# Patient Record
Sex: Female | Born: 1955
Health system: Southern US, Community
[De-identification: ages and names within clinical notes are randomized; demographics above are authoritative.]

## PROBLEM LIST (undated history)

## (undated) DIAGNOSIS — R079 Chest pain, unspecified: Secondary | ICD-10-CM

## (undated) DIAGNOSIS — F209 Schizophrenia, unspecified: Secondary | ICD-10-CM

## (undated) DIAGNOSIS — I10 Essential (primary) hypertension: Secondary | ICD-10-CM

## (undated) DIAGNOSIS — G8929 Other chronic pain: Secondary | ICD-10-CM

## (undated) DIAGNOSIS — L0390 Cellulitis, unspecified: Secondary | ICD-10-CM

## (undated) DIAGNOSIS — R7989 Other specified abnormal findings of blood chemistry: Secondary | ICD-10-CM

## (undated) DIAGNOSIS — R55 Syncope and collapse: Secondary | ICD-10-CM

## (undated) DIAGNOSIS — E785 Hyperlipidemia, unspecified: Secondary | ICD-10-CM

## (undated) DIAGNOSIS — M7062 Trochanteric bursitis, left hip: Secondary | ICD-10-CM

## (undated) DIAGNOSIS — M797 Fibromyalgia: Secondary | ICD-10-CM

## (undated) DIAGNOSIS — F1410 Cocaine abuse, uncomplicated: Secondary | ICD-10-CM

## (undated) HISTORY — DX: Trochanteric bursitis, left hip: M70.62

## (undated) HISTORY — DX: Other specified abnormal findings of blood chemistry: R79.89

## (undated) HISTORY — DX: Cocaine abuse, uncomplicated: F14.10

## (undated) HISTORY — DX: Schizophrenia, unspecified: F20.9

## (undated) HISTORY — DX: Other chronic pain: G89.29

## (undated) HISTORY — DX: Cellulitis, unspecified: L03.90

## (undated) HISTORY — DX: Hyperlipidemia, unspecified: E78.5

## (undated) HISTORY — DX: Chest pain, unspecified: R07.9

## (undated) HISTORY — DX: Syncope and collapse: R55

## (undated) HISTORY — PX: OOPHORECTOMY: SHX86

---

## 1997-06-17 ENCOUNTER — Emergency Department (HOSPITAL_COMMUNITY): Admission: EM | Admit: 1997-06-17 | Discharge: 1997-06-17 | Payer: Self-pay | Admitting: Emergency Medicine

## 1997-06-30 ENCOUNTER — Inpatient Hospital Stay (HOSPITAL_COMMUNITY): Admission: AD | Admit: 1997-06-30 | Discharge: 1997-07-02 | Payer: Self-pay | Admitting: Emergency Medicine

## 1997-07-03 ENCOUNTER — Inpatient Hospital Stay (HOSPITAL_COMMUNITY): Admission: AD | Admit: 1997-07-03 | Discharge: 1997-07-03 | Payer: Self-pay | Admitting: Obstetrics and Gynecology

## 1997-07-20 ENCOUNTER — Other Ambulatory Visit: Admission: RE | Admit: 1997-07-20 | Discharge: 1997-07-20 | Payer: Self-pay | Admitting: Obstetrics and Gynecology

## 1998-09-13 ENCOUNTER — Emergency Department (HOSPITAL_COMMUNITY): Admission: EM | Admit: 1998-09-13 | Discharge: 1998-09-13 | Payer: Self-pay | Admitting: *Deleted

## 1999-01-26 ENCOUNTER — Emergency Department (HOSPITAL_COMMUNITY): Admission: EM | Admit: 1999-01-26 | Discharge: 1999-01-26 | Payer: Self-pay | Admitting: Emergency Medicine

## 2001-07-18 ENCOUNTER — Other Ambulatory Visit: Admission: RE | Admit: 2001-07-18 | Discharge: 2001-07-18 | Payer: Self-pay | Admitting: Obstetrics and Gynecology

## 2002-12-07 ENCOUNTER — Emergency Department (HOSPITAL_COMMUNITY): Admission: EM | Admit: 2002-12-07 | Discharge: 2002-12-07 | Payer: Self-pay | Admitting: Emergency Medicine

## 2002-12-15 ENCOUNTER — Encounter: Admission: RE | Admit: 2002-12-15 | Discharge: 2002-12-15 | Payer: Self-pay | Admitting: Cardiology

## 2003-08-31 ENCOUNTER — Emergency Department (HOSPITAL_COMMUNITY): Admission: EM | Admit: 2003-08-31 | Discharge: 2003-08-31 | Payer: Self-pay | Admitting: Emergency Medicine

## 2003-09-27 ENCOUNTER — Emergency Department (HOSPITAL_COMMUNITY): Admission: EM | Admit: 2003-09-27 | Discharge: 2003-09-27 | Payer: Self-pay | Admitting: Emergency Medicine

## 2003-09-29 ENCOUNTER — Emergency Department (HOSPITAL_COMMUNITY): Admission: EM | Admit: 2003-09-29 | Discharge: 2003-09-29 | Payer: Self-pay | Admitting: Emergency Medicine

## 2005-04-01 ENCOUNTER — Emergency Department (HOSPITAL_COMMUNITY): Admission: EM | Admit: 2005-04-01 | Discharge: 2005-04-01 | Payer: Self-pay | Admitting: Emergency Medicine

## 2006-03-21 ENCOUNTER — Emergency Department (HOSPITAL_COMMUNITY): Admission: EM | Admit: 2006-03-21 | Discharge: 2006-03-21 | Payer: Self-pay | Admitting: Emergency Medicine

## 2006-07-04 ENCOUNTER — Emergency Department (HOSPITAL_COMMUNITY): Admission: EM | Admit: 2006-07-04 | Discharge: 2006-07-04 | Payer: Self-pay | Admitting: *Deleted

## 2006-09-21 ENCOUNTER — Emergency Department (HOSPITAL_COMMUNITY): Admission: EM | Admit: 2006-09-21 | Discharge: 2006-09-21 | Payer: Self-pay | Admitting: Emergency Medicine

## 2007-04-12 ENCOUNTER — Emergency Department (HOSPITAL_COMMUNITY): Admission: EM | Admit: 2007-04-12 | Discharge: 2007-04-12 | Payer: Self-pay | Admitting: Nurse Practitioner

## 2007-04-14 ENCOUNTER — Ambulatory Visit: Payer: Self-pay | Admitting: Internal Medicine

## 2007-04-15 ENCOUNTER — Inpatient Hospital Stay (HOSPITAL_COMMUNITY): Admission: EM | Admit: 2007-04-15 | Discharge: 2007-04-16 | Payer: Self-pay | Admitting: Emergency Medicine

## 2007-10-07 ENCOUNTER — Encounter: Admission: RE | Admit: 2007-10-07 | Discharge: 2007-10-07 | Payer: Self-pay | Admitting: Family Medicine

## 2008-06-24 ENCOUNTER — Emergency Department (HOSPITAL_COMMUNITY): Admission: EM | Admit: 2008-06-24 | Discharge: 2008-06-24 | Payer: Self-pay | Admitting: Emergency Medicine

## 2010-05-27 NOTE — H&P (Signed)
NAME:  Kristine Atkinson, Kristine Atkinson            ACCOUNT NO.:  [id number]   MEDICAL RECORD NO.:  [id number]          PATIENT TYPE:  OBV   LOCATION:  5522                         FACILITY:  [facility]   PHYSICIAN:  Ruthe Mannan, M.D.       DATE OF BIRTH:  [birth year]   DATE OF ADMISSION:  04/14/2007  DATE OF DISCHARGE:                              HISTORY & PHYSICAL   CHIEF COMPLAINT:  Worsening cellulitis.   PRIMARY CARE PHYSICIAN:  None.   HISTORY OF PRESENT ILLNESS:  This is a 55 year old female with no known  medical history, who presents to the emergency department after left  flank pain and swelling, worsened over the last couple of days. Was seen  in the emergency room 3 days ago and treated for left leg cellulitis  with Keflex. Since that time, she has noticed a blister developing  between her first and second toe that is very tender, along with diffuse  itching and redness of the left foot. She does admit to some chills but  denies any other systemic symptoms.   PAST MEDICAL HISTORY:  None. She is a poor historian.   ALLERGIES:  PENICILLIN (itching and hives.)   SOCIAL HISTORY:  She lives with her husband. Smokes 1 pack per day.  Occasional alcohol. Denies drug use. Unemployed.   FAMILY HISTORY:  Unknown.   REVIEW OF SYSTEMS:  See HPI.   PHYSICAL EXAMINATION:  VITAL SIGNS:  Temperature 98.5, pulse 91,  respiratory rate 16, blood pressure 132/83. O2 sat is 97%.  GENERAL:  She is alert, in obvious discomfort.  RESPIRATORY:  Clear to auscultation bilaterally.  CARDIOVASCULAR:  Regular rate and rhythm. No murmur, rub, or gallop.  EXTREMITIES:  Left toes swollen, erythema and warmth extending to the  top of the left foot with 3 x 5 cm blister between the first and second  toe on the left foot. No other visible boils or erythema.   LABORATORY DATA:  White count 9.4. Hemoglobin and hematocrit 14.9 and  44. Platelets of 467,000. Creatinine 0.9, glucose 97.   ASSESSMENT/PLAN:  1.  CELLULITIS/ABSCESS:  The patient has history of itching. May also      have a possible cross-reaction allergy      to Keflex. Will start IV Vancomycin and culture foot abscess.  2. PAIN CONTROL:  Give p.o. Percocet for pain, as the foot is      exquisitely tender.  3. DISPOSITION:  Transition to p.o. Await cultures from foot. Need to      also get set up with a primary MD.      Ruthe Mannan, M.D.  Electronically Signed     TA/MEDQ  D:  04/14/2007  T:  04/14/2007  Job:  [id number]

## 2010-05-30 NOTE — Discharge Summary (Signed)
NAME:  Kristine Atkinson, Kristine Atkinson            ACCOUNT NO.:  [id number]   MEDICAL RECORD NO.:  [id number]          PATIENT TYPE:  INP   LOCATION:  5522                         FACILITY:  [facility]   PHYSICIAN:  Pearlean Brownie, M.D.DATE OF BIRTH:  08-20-1955   DATE OF ADMISSION:  04/14/2007  DATE OF DISCHARGE:  04/16/2007                               DISCHARGE SUMMARY   REASON FOR ADMISSION:  Worsening left foot cellulitis with failure of  outpatient treatment.   DISCHARGE DIAGNOSIS:  Cellulitis of the left foot.   DISCHARGE MEDICATIONS:  1. Bactrim DS 2 tablets p.o. b.i.d. for 14 days.  2. Hibiclens applied once weekly with solution per packet      instructions.   CONSULTATIONS:  None.   PROCEDURES AND STUDIES:  The patient had an x-ray of the left foot on  April 12, 2007, which revealed no acute abnormality.   LABORATORY DATA:  On admission, the patient's CBC revealed white blood  cell count 9.4, hemoglobin 14.9, hematocrit 44.0, platelet count poor at  67 with a normal differential.  Comprehensive metabolic panel revealed  sodium 136, potassium 4.2, chloride 104, bicarb 25, glucose 97, BUN 8,  creatinine 0.9, total bilirubin 0.5, alkaline phosphatase 72, AST 19,  ALT 16.  Total protein 7.0, albumin 30.9, and calcium 9.1.  Culture  abscess revealed moderate white blood cells and was no growth in 2 days.  At the time of discharge, the patient's laboratory data values were  essentially the same.   HOSPITAL COURSE:  [name] is a 55 year old female who is a poor  historian, who presented with a worsening left foot cellulitis and  allergic reaction to Keflex with which she was being treated.  She had  been started on Keflex several days prior to admission and noticed  worsening of the left foot wound in addition to severe itching.  The  left foot wound was I&D'ed by the admitting physician and she was  started on 3 times daily Hibiclens solution soaks to help clean the  wound, but the  wound opened up more to drain.  She was also started on  vancomycin for MRSA coverage.  She was transitioned after 24 hours of IV  antibiotics to Bactrim DS which she tolerated well.  She was continuing  to improve while on the Bactrim and remained afebrile.  She was felt  stable to discharge home for the remainder of her course on Bactrim.   INSTRUCTIONS AND FOLLOWUP:  She is to follow regular diet and is to  apply warm compresses 3-4 times daily until her foot wound has  completely resolved.  She has no restrictions with regard to her  activity.  She is to follow up with her primary care physician as  needed.      Ancil Boozer, MD  Electronically Signed      Pearlean Brownie, M.D.  Electronically Signed    SA/MEDQ  D:  04/27/2007  T:  04/28/2007  Job:  [id number]

## 2010-06-04 ENCOUNTER — Emergency Department (HOSPITAL_COMMUNITY)
Admission: EM | Admit: 2010-06-04 | Discharge: 2010-06-04 | Disposition: A | Discharge status: Home / Self Care | Payer: Self-pay | Attending: Emergency Medicine | Admitting: Emergency Medicine

## 2010-06-04 ENCOUNTER — Emergency Department (HOSPITAL_COMMUNITY): Payer: Self-pay

## 2010-06-04 DIAGNOSIS — R112 Nausea with vomiting, unspecified: Secondary | ICD-10-CM | POA: Insufficient documentation

## 2010-06-04 DIAGNOSIS — R1013 Epigastric pain: Secondary | ICD-10-CM | POA: Insufficient documentation

## 2010-06-04 DIAGNOSIS — R109 Unspecified abdominal pain: Secondary | ICD-10-CM | POA: Insufficient documentation

## 2010-06-04 DIAGNOSIS — R197 Diarrhea, unspecified: Secondary | ICD-10-CM | POA: Insufficient documentation

## 2010-06-04 DIAGNOSIS — D259 Leiomyoma of uterus, unspecified: Secondary | ICD-10-CM | POA: Insufficient documentation

## 2010-06-04 LAB — URINALYSIS, ROUTINE W REFLEX MICROSCOPIC
Bilirubin Urine: NEGATIVE
Ketones, ur: NEGATIVE mg/dL
Nitrite: NEGATIVE
Specific Gravity, Urine: 1.024 (ref 1.005–1.030)
Urobilinogen, UA: 0.2 mg/dL (ref 0.0–1.0)

## 2010-06-04 LAB — CBC
MCV: 92.7 fL (ref 78.0–100.0)
Platelets: 397 10*3/uL (ref 150–400)
RDW: 15.3 % (ref 11.5–15.5)
WBC: 9.5 10*3/uL (ref 4.0–10.5)

## 2010-06-04 LAB — POCT CARDIAC MARKERS
CKMB, poc: <1 ng/mL — ABNORMAL LOW (ref 1.0–8.0)
Myoglobin, poc: 67.1 ng/mL (ref 12–200)
Troponin i, poc: <0.05 ng/mL (ref 0.00–0.09)

## 2010-06-04 LAB — DIFFERENTIAL
Basophils Relative: 0 % (ref 0–1)
Eosinophils Absolute: 0.3 10*3/uL (ref 0.0–0.7)
Eosinophils Relative: 3 % (ref 0–5)
Lymphs Abs: 3.8 10*3/uL (ref 0.7–4.0)
Neutrophils Relative %: 49 % (ref 43–77)

## 2010-06-04 LAB — LIPASE, BLOOD: Lipase: 20 U/L (ref 11–59)

## 2010-06-04 LAB — COMPREHENSIVE METABOLIC PANEL
BUN: 11 mg/dL (ref 6–23)
Calcium: 9.1 mg/dL (ref 8.4–10.5)
Glucose, Bld: 93 mg/dL (ref 70–99)
Sodium: 138 mEq/L (ref 135–145)
Total Protein: 7.4 g/dL (ref 6.0–8.3)

## 2010-06-04 MED ORDER — IOHEXOL 300 MG/ML  SOLN
100.0000 mL | Freq: Once | INTRAMUSCULAR | Status: AC | PRN
  Administered 2010-06-04: 100 mL via INTRAVENOUS

## 2010-10-07 ENCOUNTER — Encounter: Payer: Self-pay | Admitting: Neurology

## 2010-10-07 LAB — BASIC METABOLIC PANEL
Chloride: 105
GFR calc Af Amer: >60
Potassium: 4
Sodium: 136

## 2010-10-07 LAB — COMPREHENSIVE METABOLIC PANEL
AST: 20
Albumin: 3.9
Alkaline Phosphatase: 72
BUN: 10
BUN: 8
CO2: 25
CO2: 26
Calcium: 8.9
Chloride: 104
Creatinine, Ser: 0.98
GFR calc Af Amer: >60
GFR calc non Af Amer: >60
Glucose, Bld: 95
Glucose, Bld: 97
Potassium: 4.2
Total Bilirubin: 0.5

## 2010-10-07 LAB — CBC
HCT: 41
HCT: 44
Hemoglobin: 13.8
Hemoglobin: 14.9
MCHC: 34.5
MCV: 94.3
MCV: 94.7
RBC: 4.31
RBC: 4.34
WBC: 7.8
WBC: 9.4

## 2010-10-07 LAB — DIFFERENTIAL
Basophils Absolute: 0.1
Basophils Relative: 2 — ABNORMAL HIGH
Monocytes Absolute: 1
Neutro Abs: 5.4
Neutrophils Relative %: 58

## 2010-10-07 LAB — CULTURE, ROUTINE-ABSCESS

## 2010-10-15 ENCOUNTER — Ambulatory Visit: Payer: Self-pay | Admitting: Neurology

## 2011-05-13 ENCOUNTER — Encounter: Payer: Self-pay | Admitting: Neurology

## 2011-05-13 ENCOUNTER — Other Ambulatory Visit (INDEPENDENT_AMBULATORY_CARE_PROVIDER_SITE_OTHER): Payer: Self-pay

## 2011-05-13 ENCOUNTER — Ambulatory Visit (INDEPENDENT_AMBULATORY_CARE_PROVIDER_SITE_OTHER): Payer: Self-pay | Admitting: Neurology

## 2011-05-13 ENCOUNTER — Other Ambulatory Visit: Payer: Self-pay | Admitting: Neurology

## 2011-05-13 VITALS — BP 132/84 | HR 88 | Wt 197.0 lb

## 2011-05-13 DIAGNOSIS — G609 Hereditary and idiopathic neuropathy, unspecified: Secondary | ICD-10-CM

## 2011-05-13 DIAGNOSIS — M797 Fibromyalgia: Secondary | ICD-10-CM | POA: Insufficient documentation

## 2011-05-13 DIAGNOSIS — R52 Pain, unspecified: Secondary | ICD-10-CM

## 2011-05-13 LAB — CBC WITH DIFFERENTIAL/PLATELET
Basophils Absolute: 0.1 10*3/uL (ref 0.0–0.1)
HCT: 45.4 % (ref 36.0–46.0)
Hemoglobin: 15.3 g/dL — ABNORMAL HIGH (ref 12.0–15.0)
Lymphs Abs: 3.2 10*3/uL (ref 0.7–4.0)
MCV: 92.2 fl (ref 78.0–100.0)
Monocytes Absolute: 0.6 10*3/uL (ref 0.1–1.0)
Neutro Abs: 5.4 10*3/uL (ref 1.4–7.7)
Platelets: 406 10*3/uL — ABNORMAL HIGH (ref 150.0–400.0)
RDW: 14.9 % — ABNORMAL HIGH (ref 11.5–14.6)

## 2011-05-13 LAB — COMPREHENSIVE METABOLIC PANEL
Alkaline Phosphatase: 107 U/L (ref 39–117)
Creatinine, Ser: 0.9 mg/dL (ref 0.4–1.2)
Glucose, Bld: 115 mg/dL — ABNORMAL HIGH (ref 70–99)
Sodium: 141 mEq/L (ref 135–145)
Total Bilirubin: 0.6 mg/dL (ref 0.3–1.2)
Total Protein: 7.6 g/dL (ref 6.0–8.3)

## 2011-05-13 LAB — HEMOGLOBIN A1C: Hgb A1c MFr Bld: 6.7 % — ABNORMAL HIGH (ref 4.6–6.5)

## 2011-05-13 MED ORDER — AMITRIPTYLINE HCL 25 MG PO TABS: ORAL_TABLET | ORAL | Status: DC

## 2011-05-13 NOTE — Progress Notes (Signed)
- started in late 30s, started in the legs, burning, moved shoulder and the arms - gets cramping in the hands and feet, ase well as burning - Lyrica ddid help - pain gets worse - pain moves - presses up against something - urinary frequency - years of gas problems - constipation - appetite ok  - complains of moles - 100mg  lyrica three times a dya - lyrica doesn't causing her  - every 3 days of having headache  - Lyrica 100mg  - primary care doctor  - no history of lupus, rheumatoid - willis gave diagnosis of fibromyalgia -     Thank you for having me see Kristine Atkinson in consultation today at [facility] for her problem with diffuse body pains.  As you may recall, she is a 56 y.o. year old female with a history of poor medical care who presents with a 20 year history of diffuse pain in her muscles and burning pain.  The pains are migratory, can involve her bilateral upper extremities, thighs, feet.  She does endorse numbness of the hands and tingling.  The patient denies significant autonomic complaints except for constipation and gas.  She has been given the diagnosis of fibromyalgia in the past after an unremarkable EMG done by Dr. Lesia Sago and an MRI of her C-spine that revealed some spondylosis at C6-C7 but no clear significant neural compression.    She has had a good response to Lyrica in the past at a dose of 100 tid but is currently unable to afford it.  Reviewing her notes from [facility] it appears she was tried on Elavil, but she can't remember this.  She denies any problems with her strength.    Medical History:  No clear diabetes, B12 deficiency or rheumatologic conditions documents.  Surgical History: no back or neck surgeries  History   Social History  . Marital Status: Married    Spouse Name: N/A    Number of Children: N/A  . Years of Education: N/A   Social History Main Topics  . Smoking status: Current Everyday Smoker  . Smokeless tobacco: Never  Used   Comment: as of 05/13/11 about 3 cig a day  . Alcohol Use: No  . Drug Use: None  . Sexually Active: None   Other Topics Concern  . None   Social History Narrative  . None    Family Hx:  no family history of similar neurologic problems.  Current Outpatient Prescriptions on File Prior to Visit  Medication Sig Dispense Refill  . omeprazole (PRILOSEC) 20 MG capsule Take 20 mg by mouth daily.      . pregabalin (LYRICA) 100 MG capsule Take 100 mg by mouth. One by mouth three times daily      . amitriptyline (ELAVIL) 25 MG tablet start with 1 tab at night for 1 week, then increase to two tabs at night.  60 tablet  3    Allergies  Allergen Reactions  . Penicillins       ROS:  13 systems were reviewed and are notable for problems with constipation and gas  All other review of systems are unremarkable.   Examination:  Filed Vitals:   05/13/11 0812  BP: 132/84  Pulse: 88  Weight: 197 lb (89.359 kg)     In general, well appearing older women.  Cardiovascular: The patient has a regular rate and rhythm and no carotid bruits.  Extremities:  no significant trophic changes of her feet or hands  Fundoscopy:  Disks are flat. Vessel caliber within normal limits.  Mental status:   The patient is oriented to person, place and time. Recent and remote memory are intact. Attention span and concentration are normal. Language including repetition, naming, following commands are intact. Fund of knowledge of current and historical events, as well as vocabulary are normal.  Cranial Nerves: Pupils are equally round but somewhat poorly reactive to light. Visual fields full to confrontation. Extraocular movements are intact without nystagmus. Facial sensation and muscles of mastication are intact. Muscles of facial expression are symmetric. Hearing intact to bilateral finger rub. Tongue protrusion, uvula, palate midline.  Shoulder shrug intact  Motor:  The patient has normal bulk and  tone, no pronator drift.  There are no adventitious movements.  5/5 muscle strength bilaterally.  Reflexes:   Biceps  Triceps Brachioradialis Knee Ankle  Right 2+  2+  2+   2+ 1+  Left  2+  2+  2+   2+ 1+  Toes down  Coordination:  Normal finger to nose.  No dysdiadokinesia.  Sensation is decreased somewhat in a length dependent manner in hands and feet.  Vibration is normal in the right foot and slightly decreased in left foot.  Normal in hands.  Position sense seems to be somewhat impaired in her hands and feet although it is not clear whether there is a lack of understanding.  Gait and Station are mildly antalgic.  Romberg is negative  EMG report was reviewed and revealed a normal EMG of the left lower extremity.  Impression/Recs: 1.  Diffuse pain - Its migratory nature, and no clear progression, with lack of weakness makes me less concerned about a serious cause.  She has some subtle signs of a possible peripheral neuropathy, but most of her complaints would be atypical for this.  Obviously her lack of insurance makes it difficult for [place] to do significant investigations from an electrophysiologic point of view at this time.  However, I am not suspicious of a ganglionopathy or muscle disease currently(given her normal strength).  I am going to try to start her on Elavil 25mg ->50mg  qhs.  We can increase this as tolerated.  We are also going to see if she can get patient assistance for Lyrica.  I am also going to send off PN labs .Finally, we have given her the contact information for [facility] residency clinics to see if she can see a PCP there.   We will see the patient back in 3 months.  Thank you for having [place] see Kristine Atkinson in consultation.  Feel free to contact me with any questions.  [name], MD [name], [place] [address] [place], [place] [address] Phone: [phone] Fax: [phone].

## 2011-05-13 NOTE — Patient Instructions (Signed)
Go to the basement to have your labs drawn today.  Please call [facility] to see if they are currently taking new patients.  [phone].

## 2011-05-14 LAB — C-REACTIVE PROTEIN: CRP: 1.92 mg/dL — ABNORMAL HIGH (ref <0.60)

## 2011-05-15 LAB — SPEP & IFE WITH QIG
Alpha-2-Globulin: 10.5 % (ref 7.1–11.8)
Beta 2: 6.1 % (ref 3.2–6.5)
Gamma Globulin: 14.5 % (ref 11.1–18.8)
IgG (Immunoglobin G), Serum: 1070 mg/dL (ref 690–1700)
IgM, Serum: 173 mg/dL (ref 52–322)

## 2011-05-15 LAB — METHYLMALONIC ACID, SERUM: Methylmalonic Acid, Quant: 0.18 umol/L (ref <0.40)

## 2011-05-18 ENCOUNTER — Other Ambulatory Visit: Payer: Self-pay | Admitting: Neurology

## 2011-05-18 MED ORDER — PREGABALIN 100 MG PO CAPS: 100.0000 mg | ORAL_CAPSULE | Freq: Three times a day (TID) | ORAL | Status: DC

## 2011-05-25 ENCOUNTER — Telehealth: Payer: Self-pay

## 2011-05-25 NOTE — Telephone Encounter (Signed)
Pt notified of recent lab work, she has an appt on 5/20 to establish for primary care.  They are to mention her elevated a1c.

## 2011-06-01 ENCOUNTER — Ambulatory Visit (INDEPENDENT_AMBULATORY_CARE_PROVIDER_SITE_OTHER): Payer: Self-pay | Admitting: Family Medicine

## 2011-06-01 ENCOUNTER — Encounter: Payer: Self-pay | Admitting: Family Medicine

## 2011-06-01 VITALS — BP 158/96 | HR 89 | Temp 97.8°F | Ht 62.0 in | Wt 200.2 lb

## 2011-06-01 DIAGNOSIS — Z720 Tobacco use: Secondary | ICD-10-CM

## 2011-06-01 DIAGNOSIS — R52 Pain, unspecified: Secondary | ICD-10-CM

## 2011-06-01 DIAGNOSIS — E119 Type 2 diabetes mellitus without complications: Secondary | ICD-10-CM

## 2011-06-01 DIAGNOSIS — I10 Essential (primary) hypertension: Secondary | ICD-10-CM | POA: Insufficient documentation

## 2011-06-01 DIAGNOSIS — F17200 Nicotine dependence, unspecified, uncomplicated: Secondary | ICD-10-CM

## 2011-06-01 DIAGNOSIS — Z87891 Personal history of nicotine dependence: Secondary | ICD-10-CM | POA: Insufficient documentation

## 2011-06-01 DIAGNOSIS — K219 Gastro-esophageal reflux disease without esophagitis: Secondary | ICD-10-CM | POA: Insufficient documentation

## 2011-06-01 MED ORDER — METFORMIN HCL 500 MG PO TABS: 500.0000 mg | ORAL_TABLET | Freq: Two times a day (BID) | ORAL | Status: DC

## 2011-06-01 MED ORDER — OMEPRAZOLE 20 MG PO CPDR: 20.0000 mg | DELAYED_RELEASE_CAPSULE | Freq: Every day | ORAL | Status: DC

## 2011-06-01 NOTE — Assessment & Plan Note (Signed)
Will follow-up at next visit.  Will need to make changes in medications slowly due to poor health literacy.

## 2011-06-01 NOTE — Assessment & Plan Note (Signed)
Not interested in quitting or cutting back at this time.

## 2011-06-01 NOTE — Progress Notes (Signed)
  Subjective:    Patient ID: Kristine Atkinson, female    DOB: 05-22-1955, 56 y.o.   MRN: [id number]  HPI Here to establish primary care.  Referred from Dr. Modesto Charon, neurology.  Is being evaluated for chronic pain, likely fibromyalgia.  Has not had access to regular healthcare in several years.  Elevated a1c;  a1c 6.7%.  Patient has poor health literacy, does not recall FH of DM, asks many questions about if it is lifelong, can you have dental work with diabetes.  "gas": requests refill on gas and fullness.  Is out of omeprazole.   Review of Systems Patient Information Form: Screening and ROS  AUDIT-C Score: 3,  this does not likely represent unhealthy alcohol use patterns Do you feel safe in relationships? yes PHQ-2:negative  Review of Symptoms  General:  Negative for nexplained weight loss, fever Skin: Negative for new or changing mole, sore that won't heal HEENT: Negative for trouble hearing, trouble seeing, ringing in ears, mouth sores, hoarseness, change in voice, dysphagia. CV:  Negative for chest pain, dyspnea, edema, palpitations Resp: Negative for cough, dyspnea, hemoptysis GI: Negative for nausea, vomiting, diarrhea, constipation, abdominal pain, melena, hematochezia. GU: Negative for dysuria, incontinence, urinary hesitance, hematuria, vaginal or penile discharge, polyuria, sexual difficulty, lumps in testicle or breasts MSK: Negative for  joint pain or swelling Neuro: Negative for headaches, weakness, , dizziness, passing out/fainting Psych: Negative for depression, anxiety, memory problems  Positive for muscle cramps or aches, numbness    Objective:   Physical Exam GEN: Alert & Oriented, No acute distress.  Here with her husband who repeats questions for her, helps her understand conversation. HEENT: [place]/AT. EOMI, PERRLA, no conjunctival injection or scleral icterus.  B  No anterior or posterior cervical lymphadenopathy.  No thyromegaly or nodules. CV:  Regular Rate &  Rhythm, no murmur Respiratory:  Normal work of breathing, CTAB Abd:  + BS, soft, no tenderness to palpation Ext: no pre-tibial edema Psych:  Sad affect, asks many questions.  Poor health literacy.        Assessment & Plan:

## 2011-06-01 NOTE — Patient Instructions (Signed)
Start new medicine- Metformin for diabetes- One tablet twice a day  I refilled omeprazole: your acid reflux medicine  Make appointment for 3-4 weeks to follow-up diabetes  Start walking every day.

## 2011-06-01 NOTE — Assessment & Plan Note (Signed)
New diagnosis, will start metformin 500 bid.  Once patient is qualified for assistance, will refer to diabetes education.  Advised daily walking.  Will follow-up in 3-4 weeks

## 2011-06-01 NOTE — Assessment & Plan Note (Signed)
Refilled omeprazole.

## 2011-06-23 ENCOUNTER — Other Ambulatory Visit: Payer: Self-pay | Admitting: Neurology

## 2011-06-23 MED ORDER — PREGABALIN 100 MG PO CAPS: 100.0000 mg | ORAL_CAPSULE | Freq: Three times a day (TID) | ORAL | Status: DC

## 2011-06-23 NOTE — Telephone Encounter (Signed)
Patient's mail order pharmacy called regarding pt's scrip for Lyrica. Name of contact: Vickie. Vickie states that they received a copy of the Lyrica RX from the patient but that they can't fill the rx unless it comes directly from our office. They prefer the scrips to be written for 90 days. That would be a quantity of 270 w/ 1 refill. There is no mail order pharmacy listed in pt's chart. Pharmacy's direct phone number is [phone] ext [phone]. Fax number is [phone]. Please send to Attn: Vickie. May need to call patient to find out if mail order pharmacy is her new preference. Please advise.

## 2011-06-23 NOTE — Telephone Encounter (Signed)
Faxed script for Lyrica (3 month supply) to [employer] for patient assistance. Patient aware.

## 2011-06-26 ENCOUNTER — Other Ambulatory Visit: Payer: Self-pay | Admitting: Neurology

## 2011-06-26 MED ORDER — PREGABALIN 100 MG PO CAPS: 100.0000 mg | ORAL_CAPSULE | Freq: Three times a day (TID) | ORAL | Status: DC

## 2011-06-29 ENCOUNTER — Ambulatory Visit: Payer: Self-pay | Admitting: Family Medicine

## 2011-07-06 ENCOUNTER — Ambulatory Visit: Payer: Self-pay | Admitting: Family Medicine

## 2011-07-14 ENCOUNTER — Ambulatory Visit: Payer: Self-pay | Admitting: Family Medicine

## 2011-07-14 ENCOUNTER — Ambulatory Visit (INDEPENDENT_AMBULATORY_CARE_PROVIDER_SITE_OTHER): Payer: Self-pay | Admitting: Family Medicine

## 2011-07-14 VITALS — BP 124/82 | HR 88 | Temp 97.1°F | Ht 62.0 in | Wt 196.2 lb

## 2011-07-14 DIAGNOSIS — Z23 Encounter for immunization: Secondary | ICD-10-CM

## 2011-07-14 DIAGNOSIS — R030 Elevated blood-pressure reading, without diagnosis of hypertension: Secondary | ICD-10-CM

## 2011-07-14 DIAGNOSIS — H9190 Unspecified hearing loss, unspecified ear: Secondary | ICD-10-CM | POA: Insufficient documentation

## 2011-07-14 DIAGNOSIS — E119 Type 2 diabetes mellitus without complications: Secondary | ICD-10-CM

## 2011-07-14 NOTE — Addendum Note (Signed)
Addended by: Jimmy Footman K on: 07/14/2011 11:28 AM   Modules accepted: Orders

## 2011-07-14 NOTE — Addendum Note (Signed)
Addended by: Macy Mis on: 07/14/2011 11:40 AM   Modules accepted: Orders

## 2011-07-14 NOTE — Progress Notes (Signed)
  Subjective:    Patient ID: Kristine Atkinson, female    DOB: 06-15-1955, 56 y.o.   MRN: [id number]  HPI Here for follow-up.  Since seen last for new patient appt, patient now has qualified for [facility] card assistance  DIABETES  Taking and tolerating: yes Fasting blood sugars: not indicated  Hypoglycemic symptoms: no Visual problems: no Monitoring feet: yes Numbness/Tingling: no Last eye exam: none Diabetic Labs:  Lab Results  Component Value Date   HGBA1C 6.7* 05/13/2011   Lab Results  Component Value Date   CREATININE 0.9 05/13/2011   Last microalbumin: No results found for this basename: MICROALBUR, [id number]    Hearing loss:  Patient repots chronic hearing loss, right > left.  Notes left TM rupture after trauma many years ago.  No drainage.  No ringing.    Review of Systems See HPI    Objective:   Physical Exam GEN: Alert & Oriented, No acute distress HEENT: [place]/AT. EOMI, PERRLA, no conjunctival injection or scleral icterus.  Right tympanic membranes intact, left TM near total rupture. CV:  Regular Rate & Rhythm, no murmur Respiratory:  Normal work of breathing, CTAB Abd:  + BS, soft, no tenderness to palpation Ext: no pre-tibial edema        Assessment & Plan:

## 2011-07-14 NOTE — Patient Instructions (Addendum)
Keep up the good work!  You're doing a good job with your weight and blood pressure  I will schedule you an appointment for a diabetes class  See information to schedule a mammogram  Come back in 2-3 months for your annual physical (pap smear)  I will refer you to an ear doctor, we will call you when there is a spot.

## 2011-07-14 NOTE — Assessment & Plan Note (Signed)
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  Will refer to ENT for options on repair.

## 2011-07-14 NOTE — Assessment & Plan Note (Signed)
Tolerating metformin well.  Will refer to DM education.  Discussed with patient checking CBG's, we decided to defer this until on a hypoglycemic agent.

## 2011-07-14 NOTE — Assessment & Plan Note (Signed)
At goal today without meds. 

## 2011-07-15 ENCOUNTER — Encounter: Payer: Self-pay | Admitting: Family Medicine

## 2011-07-15 DIAGNOSIS — E785 Hyperlipidemia, unspecified: Secondary | ICD-10-CM | POA: Insufficient documentation

## 2011-07-15 LAB — MICROALBUMIN / CREATININE URINE RATIO
Creatinine, Urine: 229.9 mg/dL
Microalb, Ur: 0.91 mg/dL (ref 0.00–1.89)

## 2011-07-17 ENCOUNTER — Telehealth: Payer: Self-pay | Admitting: Family Medicine

## 2011-07-17 DIAGNOSIS — E785 Hyperlipidemia, unspecified: Secondary | ICD-10-CM

## 2011-07-17 MED ORDER — PRAVASTATIN SODIUM 40 MG PO TABS: 40.0000 mg | ORAL_TABLET | Freq: Every day | ORAL | Status: DC

## 2011-07-17 NOTE — Telephone Encounter (Signed)
Spoke with patient, will start pravastatin for hyperlipidemia. Will recheck at next appt in 2-3 months

## 2011-07-17 NOTE — Assessment & Plan Note (Signed)
Will start pravastatin 40

## 2011-07-24 ENCOUNTER — Other Ambulatory Visit: Payer: Self-pay | Admitting: Family Medicine

## 2011-07-24 DIAGNOSIS — Z1231 Encounter for screening mammogram for malignant neoplasm of breast: Secondary | ICD-10-CM

## 2011-08-06 ENCOUNTER — Ambulatory Visit: Payer: Self-pay | Admitting: *Deleted

## 2011-08-13 ENCOUNTER — Ambulatory Visit (INDEPENDENT_AMBULATORY_CARE_PROVIDER_SITE_OTHER): Payer: Self-pay | Admitting: Neurology

## 2011-08-13 ENCOUNTER — Encounter: Payer: Self-pay | Admitting: Neurology

## 2011-08-13 VITALS — BP 122/80 | HR 88 | Wt 198.0 lb

## 2011-08-13 DIAGNOSIS — G629 Polyneuropathy, unspecified: Secondary | ICD-10-CM

## 2011-08-13 DIAGNOSIS — G609 Hereditary and idiopathic neuropathy, unspecified: Secondary | ICD-10-CM

## 2011-08-13 MED ORDER — AMITRIPTYLINE HCL 25 MG PO TABS: 50.0000 mg | ORAL_TABLET | Freq: Every day | ORAL | Status: DC

## 2011-08-13 NOTE — Progress Notes (Signed)
Dear Dr. Bonnetta Barry ref. provider found,  I saw  Kristine Atkinson back in [facility] for her problem with diffuse body pains and numbness and tingling in her hands and feet.  As you may recall, she is a 56 y.o. year old female with a history of possible diabetes and poor medical care who I saw initially and felt that other than a possible distal symmetric peripheral neuropathy there was no signs of other neurologic disease.  PN labs were unremarkable except for elevated HbA1C of 6.7.  I started her on Elavil 25mg  qhs to increase to 50mg  qhs.  She thinks this helped her. However, she only takes 25mg  at night, for no obvious reasons.  In addition, we have restarted her on Lyrica through the patient assistant program.  She is currently taking 100mg  tid.  She continues to complain of numbness in her fingers and hands, as well as diffuse body pains.  At times it is difficult to walk on her feet.   Medical history, social history, and family history were reviewed and have not changed since the last clinic visit.  Current Outpatient Prescriptions on File Prior to Visit  Medication Sig Dispense Refill  . metFORMIN (GLUCOPHAGE) 500 MG tablet Take 1 tablet (500 mg total) by mouth 2 (two) times daily with a meal.  60 tablet  1  . omeprazole (PRILOSEC) 20 MG capsule Take 1 capsule (20 mg total) by mouth daily.  30 capsule  0  . pravastatin (PRAVACHOL) 40 MG tablet Take 1 tablet (40 mg total) by mouth daily.  30 tablet  2  . pregabalin (LYRICA) 100 MG capsule Take 1 capsule (100 mg total) by mouth 3 (three) times daily.  270 capsule  1  . DISCONTD: amitriptyline (ELAVIL) 25 MG tablet start with 1 tab at night for 1 week, then increase to two tabs at night.  60 tablet  3  . cyclobenzaprine (FLEXERIL) 10 MG tablet Take 10 mg by mouth. One po qhs        Allergies  Allergen Reactions  . Penicillins     ROS:  13 systems were reviewed and are notable for chronic back pain.  All other review of systems  are unremarkable.  Exam: . Filed Vitals:   08/13/11 0909  BP: 122/80  Pulse: 88  Weight: 198 lb (89.812 kg)    In general, obese women.   Cranial Nerves: Pupils are equally round and reactive to light. Visual fields full to confrontation. Extraocular movements are intact without nystagmus. Facial sensation and muscles of mastication are intact. Muscles of facial expression are symmetric. Hearing intact to bilateral finger rub. Tongue protrusion, uvula, palate midline.  Shoulder shrug intact  Motor:  Normal bulk and tone, no drift and 5/5 muscle strength bilaterally.  Reflexes:  2+ thoughout, except absent ankles.   Sensation:  Appears intact to temperature distally.  Slightly decrease to vibration.  I think patient has a hard time understanding position sense instructions, but I don't think it is impaired.  Coordination:  Normal finger to nose  Gait:  Normal gait and station.  Has difficulty standing with her feet together.  Impression/Recommendations:  1.  ?distal symmetric peripheral neuropathy - The patient has some subtle signs of a peripheral neuropathy, perhaps contributed to by her new diagnosis of diabetes.  I don't have a good explanation for her diffuse body pains, but I don't think it is due to a neuromuscular condition.  I have increased her Elavil today to 50mg   qhs.  Her Lyrica can be increased to as much as 200mg  tid as necessary.  If her pain continues to increase, in particular her distal numbness, a NCS probably makes sense to actually verify a DSPN -- this could be done at [facility].  As I will be leaving [place] to an Epilepsy practice at [facility], the patient will follow up with you.  If her pain is poorly controlled on the above regimen than another referral to another neurologist may be necessary.   [name], MD [facility], [place]

## 2011-08-31 ENCOUNTER — Encounter: Payer: Self-pay | Attending: Family Medicine | Admitting: *Deleted

## 2011-08-31 ENCOUNTER — Encounter: Payer: Self-pay | Admitting: *Deleted

## 2011-08-31 VITALS — Ht 62.0 in | Wt 196.6 lb

## 2011-08-31 DIAGNOSIS — E119 Type 2 diabetes mellitus without complications: Secondary | ICD-10-CM | POA: Insufficient documentation

## 2011-08-31 DIAGNOSIS — Z713 Dietary counseling and surveillance: Secondary | ICD-10-CM | POA: Insufficient documentation

## 2011-08-31 NOTE — Progress Notes (Signed)
  Medical Nutrition Therapy:  Appt start time: 0915 end time:  1015.  Assessment:  Primary concerns today: patient here for diabetes education. She states she was diagnosed with Diabetes 3 months ago and this is her first diabetes education. She is disabled due to Fibromyalgia and her activity level is limited due to pain. She does not have a meter yet.  MEDICATIONS: see list. Diabetes medication is Metformin   DIETARY INTAKE:  Usual eating pattern includes 2-3 meals and 2-3 snacks per day.  Everyday foods include easily prepared foods and often sweets.  Avoided foods include milk causes gas.    24-hr recall:  B ( AM): left over from last night Snk ( AM): occasionally moon pies   L ( PM): skips 3 days a week or if eats lunch then no dinner;  Snk ( PM):  occasionally moon pies or ice cream in bowl or Nutty Buddie D ( PM): meat, starch, vegetables, occasionally a salad, regular soda Snk ( PM):  occasionally moon pies or ice cream in bowl or Nutty Buddie Beverages: Sprite, water  Usual physical activity: walks inside the house, outside occasionally, cuts the grass  Estimated energy needs: 1400 calories 158 g carbohydrates 105 g protein 39 g fat  Progress Towards Goal(s):  In progress.   Nutritional Diagnosis:  NB-1.1 Food and nutrition-related knowledge deficit As related to diabetes management.  As evidenced by new diagnosis.    Intervention:  Nutrition counseling and diabetes education initiated. Discussed basic physiology of diabetes, SMBG and rationale of checking BG at alternate times of day when she has Rx for meter from [facility], A1c, Carb Counting and reading food labels, and benefits of increased activity. Plan: Consider taking Metformin after the meal in morning and at night instead of before the meal to help with stomach upset Consider taking Lactaid pill when drinking milk or buy Lactaid Milk to help with gas Consider switching from regular Sprite and OJ to diet Sprite or  Power Aid drinks that have 1/2 the sugar of regular Limit eggs to 3 a week to help with cholesterol Remember that starch, fruit and sugar in drinks or desserts will increase your blood sugar so choose 2 servings per meal Vegetables and lean meats do not make blood sugar go up  Handouts given during visit include: Living Well with Diabetes Carb Counting and Food Label handouts Meal Plan Card  Monitoring/Evaluation:  Dietary intake, exercise, reading food labels, and body weight in 4 weeks.

## 2011-08-31 NOTE — Patient Instructions (Addendum)
Plan: Consider taking Metformin after the meal in morning and at night instead of before the meal to help with stomach upset Consider taking Lactaid pill when drinking milk or buy Lactaid Milk to help with gas Consider switching from regular Sprite and OJ to diet Sprite or Power Aid drinks that have 1/2 the sugar of regular Limit eggs to 3 a week to help with cholesterol Remember that starch, fruit and sugar in drinks or desserts will increase your blood sugar so choose 2 servings per meal Vegetables and lean meats do not make blood sugar go up

## 2011-09-02 ENCOUNTER — Ambulatory Visit (HOSPITAL_COMMUNITY)
Admission: RE | Admit: 2011-09-02 | Discharge: 2011-09-02 | Disposition: A | Discharge status: Home / Self Care | Payer: Self-pay | Source: Ambulatory Visit | Attending: Family Medicine | Admitting: Family Medicine

## 2011-09-02 DIAGNOSIS — Z1231 Encounter for screening mammogram for malignant neoplasm of breast: Secondary | ICD-10-CM | POA: Insufficient documentation

## 2011-09-03 ENCOUNTER — Encounter: Payer: Self-pay | Admitting: Family Medicine

## 2011-09-03 ENCOUNTER — Ambulatory Visit (INDEPENDENT_AMBULATORY_CARE_PROVIDER_SITE_OTHER): Payer: Self-pay | Admitting: Family Medicine

## 2011-09-03 VITALS — BP 133/84 | HR 94 | Ht 62.0 in | Wt 195.0 lb

## 2011-09-03 DIAGNOSIS — E119 Type 2 diabetes mellitus without complications: Secondary | ICD-10-CM

## 2011-09-03 DIAGNOSIS — IMO0001 Reserved for inherently not codable concepts without codable children: Secondary | ICD-10-CM

## 2011-09-03 DIAGNOSIS — M797 Fibromyalgia: Secondary | ICD-10-CM

## 2011-09-03 DIAGNOSIS — R454 Irritability and anger: Secondary | ICD-10-CM

## 2011-09-03 MED ORDER — AMITRIPTYLINE HCL 50 MG PO TABS: 50.0000 mg | ORAL_TABLET | Freq: Every day | ORAL | Status: DC

## 2011-09-03 MED ORDER — PREGABALIN 225 MG PO CAPS: 225.0000 mg | ORAL_CAPSULE | Freq: Two times a day (BID) | ORAL | Status: DC

## 2011-09-03 NOTE — Assessment & Plan Note (Signed)
Increased lyrica from 100 tid to 225 BID.  Was take amitriptyline 25 mg 1-2 tabs qhs to 50 mg qhs tabs.  Encouraged continued physical activity, reports good sleep.  I suspect mood disorder is playing a significant role in poor control of her symptoms.

## 2011-09-03 NOTE — Patient Instructions (Addendum)
No meter yet- I want you to save your money to see ENT  Your blood sugar number looks good today:  a1c is 6.2%  I refilled your [facility] at [facility]  Will increase your lyrica to 225 mg twice a day  Follow- up for pap smear in 1 month

## 2011-09-03 NOTE — Progress Notes (Signed)
  Subjective:    Patient ID: Kristine Atkinson, female    DOB: 11-03-1955, 56 y.o.   MRN: [id number]  HPI Here for follow-up of diabetes and fibromyalgia  Diabetes:  Has been going to diabetes class.  Asks about checking blood glucoses.  Has ben taking metformin but did not bring today.  NO side effects.  DIABETES  Diabetic Labs:  Lab Results  Component Value Date   HGBA1C 6.2 09/03/2011   HGBA1C 6.7* 05/13/2011   Lab Results  Component Value Date   MICROALBUR 0.91 07/14/2011   LDLCALC 147* 07/14/2011   CREATININE 0.9 05/13/2011   Last microalbumin: Lab Results  Component Value Date   MICROALBUR 0.91 07/14/2011    Fibromylagia:  Her neurologist is no longer in practice.  She requests increase of lyrica and amitriptyline.  States continues to have generalized pain and tingling throughout body.  Moods:  I inquired about mood disorder given her presentation.  She endorses long history of sad moods.  Denies manic symptoms.  More so than being sad- she states she gets angry quickly for little reason.  Denies knowing any family history.  Denies previous treatment or hospitalization but states she was told by a previous PCP that she should be on medications.  Review of Systems See HPI    Objective:   Physical Exam  GEN: Alert & Oriented, No acute distress.  Here with female partner who at times she looks toward to answer for her.   CV:  Regular Rate & Rhythm, no murmur Respiratory:  Normal work of breathing, CTAB MSK:  Pain out of proportion to exam on light touch throughout body. Psych:    Difficult to control impulse to speak- interrupts.  Speaks quickly and loudly.          Assessment & Plan:

## 2011-09-03 NOTE — Assessment & Plan Note (Signed)
concern for mood disorder complicating chronic pain and medical care.  Difficult to assess today.  Referred to mental health for further evaluation and assessment.

## 2011-09-03 NOTE — Assessment & Plan Note (Signed)
Improved on metformin only.  She asked about glucometer from suggestion of her DM education class.  I discouraged use at this time due to limited financial resources (reports not being seen by ENT due to $20 copay) and limited health literacy.  Follow-up in 3 months.

## 2011-09-09 ENCOUNTER — Ambulatory Visit: Payer: Self-pay | Admitting: Family Medicine

## 2011-09-13 ENCOUNTER — Other Ambulatory Visit: Payer: Self-pay | Admitting: Family Medicine

## 2011-09-21 ENCOUNTER — Telehealth: Payer: Self-pay | Admitting: Family Medicine

## 2011-09-21 NOTE — Telephone Encounter (Signed)
Called [facility] and RX has been ready to pick up for 7 days I was told. Also on 08/22 it looks like RX was printed for Lyrica . Will check with Dr. Earnest Bailey as to if this was given to patient or faxed. I cannot locate it in previous faxes. Patient states it was not given to her.

## 2011-09-21 NOTE — Telephone Encounter (Signed)
Patient is calling because [facility] did not received the Rx for Metformin and she needs a refill on Lyrica that she gets through [facility].

## 2011-09-21 NOTE — Telephone Encounter (Signed)
Will check fax pile again, it not present, will fax again to pharmacy

## 2011-09-21 NOTE — Telephone Encounter (Signed)
Patient notified that RX has been faxed to [facility].

## 2011-09-24 ENCOUNTER — Ambulatory Visit: Payer: Self-pay | Admitting: Neurology

## 2011-09-28 ENCOUNTER — Ambulatory Visit: Payer: Self-pay | Admitting: *Deleted

## 2011-10-08 ENCOUNTER — Other Ambulatory Visit (HOSPITAL_COMMUNITY)
Admission: RE | Admit: 2011-10-08 | Discharge: 2011-10-08 | Disposition: A | Discharge status: Home / Self Care | Payer: Self-pay | Source: Ambulatory Visit | Attending: Family Medicine | Admitting: Family Medicine

## 2011-10-08 ENCOUNTER — Ambulatory Visit (INDEPENDENT_AMBULATORY_CARE_PROVIDER_SITE_OTHER): Payer: Self-pay | Admitting: Family Medicine

## 2011-10-08 ENCOUNTER — Encounter: Payer: Self-pay | Admitting: Family Medicine

## 2011-10-08 VITALS — BP 113/80 | HR 98 | Temp 98.1°F | Ht 62.0 in | Wt 200.0 lb

## 2011-10-08 DIAGNOSIS — Z124 Encounter for screening for malignant neoplasm of cervix: Secondary | ICD-10-CM

## 2011-10-08 DIAGNOSIS — Z01419 Encounter for gynecological examination (general) (routine) without abnormal findings: Secondary | ICD-10-CM | POA: Insufficient documentation

## 2011-10-08 DIAGNOSIS — Z1211 Encounter for screening for malignant neoplasm of colon: Secondary | ICD-10-CM

## 2011-10-08 NOTE — Patient Instructions (Addendum)
See info to contact someone to discuss your moods  Work on going on a fast walk daily  Bring back stool cards  Follow-up in 3 months for diabetes check

## 2011-10-08 NOTE — Progress Notes (Signed)
  Subjective:    Patient ID: Kristine Atkinson, female    DOB: Jun 30, 1955, 56 y.o.   MRN: [id number]  HPI  Annual Gynecological Exam  G3P3 Wt Readings from Last 3 Encounters:  10/08/11 200 lb (90.719 kg)  09/03/11 195 lb (88.451 kg)  08/31/11 196 lb 9.6 oz (89.177 kg)   Last period:  Menopause 4-5 years Regular periods: no Heavy bleeding: no  Sexually active: yes Birth control or hormonal therapy: menopausal Hx of STD: Patient desires STD screening Vaginal discharge: no Dysuria:No   Last mammogram: 2013 Breast mass or concerns: No  Last Pap: patient does not know.  States always was normal  History of abnormal pap: No  FH of breast, uterine, ovarian, colon cancer:  Aunt and uncle with colon cancer.   See HPI Review of Systemssee HPI     Objective:   Physical Exam GEN: Alert & Oriented, No acute distress, here with husband CV:  Regular Rate & Rhythm, no murmur Respiratory:  Normal work of breathing, CTAB Abd:  + BS, soft, no tenderness to palpation Ext: no pre-tibial edema Pelvic Exam:        External: normal female genitalia without lesions or masses        Vagina: normal without lesions or masses        Cervix: normal without lesions or masses        Adnexa: normal bimanual exam without masses or fullness        Uterus: normal by palpation        Pap smear: performed           Assessment & Plan:  Declines flu shot.   Counseled on colonoscopy, unable to afford, will take stool cards home Requests report of information to contact behavioral health about irritable moods

## 2011-10-12 ENCOUNTER — Encounter: Payer: Self-pay | Admitting: Family Medicine

## 2011-10-22 LAB — HEMOCCULT GUIAC POC 1CARD (OFFICE)
Card #2 Fecal Occult Blod, POC: POSITIVE
Fecal Occult Blood, POC: POSITIVE

## 2011-10-22 NOTE — Addendum Note (Signed)
Addended by: [place], Annisha Baar on: 10/22/2011 05:12 PM   Modules accepted: Orders

## 2011-10-26 ENCOUNTER — Telehealth: Payer: Self-pay | Admitting: Family Medicine

## 2011-10-26 NOTE — Telephone Encounter (Signed)
Left message to discuss positive hemoccult- asked patient to return call.

## 2011-10-28 ENCOUNTER — Telehealth: Payer: Self-pay | Admitting: Family Medicine

## 2011-10-28 DIAGNOSIS — R195 Other fecal abnormalities: Secondary | ICD-10-CM | POA: Insufficient documentation

## 2011-10-28 NOTE — Telephone Encounter (Signed)
Calling to discuss positive hemoccult with patient

## 2011-10-28 NOTE — Addendum Note (Signed)
Addended by: Macy Mis on: 10/28/2011 11:23 AM   Modules accepted: Orders

## 2011-10-28 NOTE — Assessment & Plan Note (Signed)
3 positive samples October 2013:  Discussed with patient need for colonoscopy-very important to find out source of bleeding.  Access limited by finances.  Will give patient information about [facility]

## 2011-10-28 NOTE — Telephone Encounter (Signed)
Discussed heme positive stools.  Discussed with patient importance of colonoscopy to find out source.  Patient and husband interested in contacting [facility] for availability of sliding scale financial assistance.

## 2011-10-28 NOTE — Telephone Encounter (Signed)
Referral/records faxed to [facility] at [facility] [phone].  Ileana Ladd

## 2011-12-09 ENCOUNTER — Encounter: Payer: Self-pay | Admitting: Family Medicine

## 2011-12-09 ENCOUNTER — Ambulatory Visit (INDEPENDENT_AMBULATORY_CARE_PROVIDER_SITE_OTHER): Payer: Self-pay | Admitting: Family Medicine

## 2011-12-09 VITALS — BP 140/80 | HR 93 | Temp 98.2°F | Ht 62.0 in | Wt 205.0 lb

## 2011-12-09 DIAGNOSIS — E119 Type 2 diabetes mellitus without complications: Secondary | ICD-10-CM

## 2011-12-09 DIAGNOSIS — IMO0001 Reserved for inherently not codable concepts without codable children: Secondary | ICD-10-CM

## 2011-12-09 DIAGNOSIS — R195 Other fecal abnormalities: Secondary | ICD-10-CM

## 2011-12-09 DIAGNOSIS — M797 Fibromyalgia: Secondary | ICD-10-CM

## 2011-12-09 MED ORDER — PREGABALIN 225 MG PO CAPS: 225.0000 mg | ORAL_CAPSULE | Freq: Two times a day (BID) | ORAL | Status: DC

## 2011-12-09 NOTE — Assessment & Plan Note (Addendum)
Continue current meds.  a1c at goal

## 2011-12-09 NOTE — Assessment & Plan Note (Signed)
Will refill fibromyalgia medicines and send to [facility] per patient request

## 2011-12-09 NOTE — Assessment & Plan Note (Signed)
Patient reports having colonoscopy last week and getting a good report.  Will await official paperwork.

## 2011-12-09 NOTE — Patient Instructions (Addendum)
Do some walking every day  Make a nurse apopintment for your flu shot  Have a ncie Thanksgiving  See you in 3 months!

## 2011-12-09 NOTE — Progress Notes (Signed)
  Subjective:    Patient ID: Kristine Atkinson, female    DOB: 08/26/1955, 56 y.o.   MRN: [id number]  HPI  Here for follow-up  DIABETES  Taking and tolerating: yes- metformin BID Fasting blood sugars: no  Hypoglycemic symptoms: no Visual problems: no Monitoring feet: yes Numbness/Tingling: yes Last eye exam: ovedue Diabetic Labs:  Lab Results  Component Value Date   HGBA1C 6.3 12/09/2011   HGBA1C 6.2 09/03/2011   HGBA1C 6.7* 05/13/2011   Lab Results  Component Value Date   MICROALBUR 0.91 07/14/2011   LDLCALC 147* 07/14/2011   CREATININE 0.9 05/13/2011   Last microalbumin: Lab Results  Component Value Date   MICROALBUR 0.91 07/14/2011    Fibromyalgia:  Would liek me to send refill to her medication assitance program.  Taking elavil at night- has helped a lot with sleep.  Plans on starting exercise -   HYPERLIPIDEMIA  Diet: Not following low cholesterol diet Exercise: No regular exercise Wt Readings from Last 3 Encounters:  12/09/11 205 lb (92.987 kg)  10/08/11 200 lb (90.719 kg)  09/03/11 195 lb (88.451 kg)   ROS:  Denies RUQ pain, myalgias, or symptoms or coronary ischemia Lab Results  Component Value Date   LDLCALC 147* 07/14/2011   Lab Results  Component Value Date   CHOL 209* 07/14/2011   Lab Results  Component Value Date   HDL 45 07/14/2011   Lab Results  Component Value Date   TRIG 87 07/14/2011   Lab Results  Component Value Date   ALT 19 05/13/2011   AST 21 05/13/2011   ALKPHOS 107 05/13/2011   BILITOT 0.6 05/13/2011       Review of Systems    see hpi Objective:   Physical Exam   GEN: Alert & Oriented, No acute distress CV:  Regular Rate & Rhythm, no murmur Respiratory:  Normal work of breathing, CTAB Abd:  + BS, soft, no tenderness to palpation Ext: no pre-tibial edema Feet: monoflament testing shows decreased sensation on bilateral soles of feet.  Dorsal surface sensation preserved.     Assessment & Plan:

## 2012-03-02 ENCOUNTER — Encounter: Payer: Self-pay | Admitting: Family Medicine

## 2012-03-02 ENCOUNTER — Ambulatory Visit (INDEPENDENT_AMBULATORY_CARE_PROVIDER_SITE_OTHER): Payer: No Typology Code available for payment source | Admitting: Family Medicine

## 2012-03-02 VITALS — BP 128/83 | HR 89 | Ht 62.0 in | Wt 203.0 lb

## 2012-03-02 DIAGNOSIS — M797 Fibromyalgia: Secondary | ICD-10-CM

## 2012-03-02 LAB — CK: Total CK: 238 U/L — ABNORMAL HIGH (ref 7–177)

## 2012-03-02 LAB — COMPREHENSIVE METABOLIC PANEL
BUN: 15 mg/dL (ref 6–23)
CO2: 28 mEq/L (ref 19–32)
Calcium: 9.1 mg/dL (ref 8.4–10.5)
Chloride: 105 mEq/L (ref 96–112)
Creat: 1.04 mg/dL (ref 0.50–1.10)
Total Bilirubin: 0.4 mg/dL (ref 0.3–1.2)

## 2012-03-02 LAB — CBC
HCT: 42.1 % (ref 36.0–46.0)
Hemoglobin: 14.4 g/dL (ref 12.0–15.0)
MCV: 87.2 fL (ref 78.0–100.0)
RBC: 4.83 MIL/uL (ref 3.87–5.11)
WBC: 8.8 10*3/uL (ref 4.0–10.5)

## 2012-03-02 MED ORDER — AMITRIPTYLINE HCL 75 MG PO TABS: 75.0000 mg | ORAL_TABLET | Freq: Every day | ORAL | Status: DC

## 2012-03-02 MED ORDER — PREGABALIN 225 MG PO CAPS: 225.0000 mg | ORAL_CAPSULE | Freq: Two times a day (BID) | ORAL | Status: DC

## 2012-03-02 NOTE — Progress Notes (Signed)
  Subjective:    Patient ID: Kristine Atkinson, female    DOB: 07-Jan-1956, 57 y.o.   MRN: [id number]  HPI  Here for follow-up of fibromyalgia  Continued pain in arms and legs.  Worsening- was previously better controlled on current regimen.  Feels she is unable to do things she enjoys such as walking due to pain.  States she took some hydrocodone given by a dentist and that seemed to help.  Taking elavil at night without somnolence or side effects.    No new meds, fever, chills, numbness, tingling. Review of Systems See HPI    Objective:   Physical Exam GEN: Alert & Oriented, here with her husband.  Tearful.   CV:  Regular Rate & Rhythm, no murmur Respiratory:  Normal work of breathing, CTAB Abd:  + BS, soft, no tenderness to palpation Ext: no pre-tibial edema Neuro:nromal gait.  Normal reflexes.  Not tender over muscles when distracted.       Assessment & Plan:

## 2012-03-02 NOTE — Assessment & Plan Note (Addendum)
Patient with worsening muscle pain, consistent with fibromyalgia.  No inciting factors to significantly worse flare, overall poor health literacy and overall functioning likely playing a significant factor.  Cannot illicit depressive or anxious symptoms although patient exhibits them today, but states it is due to pain and discomfort.  Will check CBC, CMET, CK, TSH given worsening pain.  Will continue lyrica, increase elavil from 50-75 as poor sleep is also a factor.  Will refer to physical therapy.  Unable to refer for further neurological evaluation due to lack of insurance-  Likely low yield.   Would consider referral to pain management clinic as next step.

## 2012-03-02 NOTE — Patient Instructions (Addendum)
Will increase amitriptyline from 50 mg to 75 mg daily.  Will refer you to physical therapy  Follow-up in 4-6 weeks to see how you are doing with pain

## 2012-03-04 ENCOUNTER — Telehealth: Payer: Self-pay | Admitting: Family Medicine

## 2012-03-04 NOTE — Telephone Encounter (Signed)
Spoke with Kristine Atkinson's husband, discussed mildly elevated ck,  Possibly due to increased walking in the previous few days. will try to cut pravastatin in half and see if it makes a difference in pain.

## 2012-03-15 ENCOUNTER — Ambulatory Visit: Payer: No Typology Code available for payment source | Admitting: Rehabilitation

## 2012-03-22 ENCOUNTER — Ambulatory Visit: Payer: No Typology Code available for payment source | Attending: Rehabilitation | Admitting: Rehabilitation

## 2012-03-22 DIAGNOSIS — R262 Difficulty in walking, not elsewhere classified: Secondary | ICD-10-CM | POA: Insufficient documentation

## 2012-03-22 DIAGNOSIS — M2550 Pain in unspecified joint: Secondary | ICD-10-CM | POA: Insufficient documentation

## 2012-03-22 DIAGNOSIS — R293 Abnormal posture: Secondary | ICD-10-CM | POA: Insufficient documentation

## 2012-03-22 DIAGNOSIS — M2560 Stiffness of unspecified joint, not elsewhere classified: Secondary | ICD-10-CM | POA: Insufficient documentation

## 2012-03-22 DIAGNOSIS — IMO0001 Reserved for inherently not codable concepts without codable children: Secondary | ICD-10-CM | POA: Insufficient documentation

## 2012-03-24 ENCOUNTER — Ambulatory Visit: Payer: No Typology Code available for payment source | Admitting: Physical Therapy

## 2012-03-29 ENCOUNTER — Ambulatory Visit: Payer: No Typology Code available for payment source | Admitting: Physical Therapy

## 2012-03-31 ENCOUNTER — Ambulatory Visit: Payer: No Typology Code available for payment source | Admitting: Physical Therapy

## 2012-04-05 ENCOUNTER — Ambulatory Visit: Payer: No Typology Code available for payment source | Admitting: Physical Therapy

## 2012-04-07 ENCOUNTER — Ambulatory Visit: Payer: No Typology Code available for payment source | Admitting: Physical Therapy

## 2012-04-12 ENCOUNTER — Ambulatory Visit: Payer: No Typology Code available for payment source | Attending: Rehabilitation | Admitting: Physical Therapy

## 2012-04-12 DIAGNOSIS — R262 Difficulty in walking, not elsewhere classified: Secondary | ICD-10-CM | POA: Insufficient documentation

## 2012-04-12 DIAGNOSIS — M2550 Pain in unspecified joint: Secondary | ICD-10-CM | POA: Insufficient documentation

## 2012-04-12 DIAGNOSIS — M2560 Stiffness of unspecified joint, not elsewhere classified: Secondary | ICD-10-CM | POA: Insufficient documentation

## 2012-04-12 DIAGNOSIS — R293 Abnormal posture: Secondary | ICD-10-CM | POA: Insufficient documentation

## 2012-04-12 DIAGNOSIS — IMO0001 Reserved for inherently not codable concepts without codable children: Secondary | ICD-10-CM | POA: Insufficient documentation

## 2012-04-14 ENCOUNTER — Ambulatory Visit: Payer: No Typology Code available for payment source | Admitting: Physical Therapy

## 2012-04-18 ENCOUNTER — Encounter: Payer: Self-pay | Admitting: Family Medicine

## 2012-04-18 ENCOUNTER — Ambulatory Visit (INDEPENDENT_AMBULATORY_CARE_PROVIDER_SITE_OTHER): Payer: No Typology Code available for payment source | Admitting: Family Medicine

## 2012-04-18 VITALS — BP 148/80 | HR 100 | Ht 62.0 in | Wt 211.0 lb

## 2012-04-18 DIAGNOSIS — M797 Fibromyalgia: Secondary | ICD-10-CM

## 2012-04-18 DIAGNOSIS — I10 Essential (primary) hypertension: Secondary | ICD-10-CM

## 2012-04-18 DIAGNOSIS — IMO0001 Reserved for inherently not codable concepts without codable children: Secondary | ICD-10-CM

## 2012-04-18 DIAGNOSIS — E119 Type 2 diabetes mellitus without complications: Secondary | ICD-10-CM

## 2012-04-18 DIAGNOSIS — R454 Irritability and anger: Secondary | ICD-10-CM

## 2012-04-18 LAB — POCT GLYCOSYLATED HEMOGLOBIN (HGB A1C): Hemoglobin A1C: 6.5

## 2012-04-18 MED ORDER — LISINOPRIL 10 MG PO TABS: 10.0000 mg | ORAL_TABLET | Freq: Every day | ORAL | Status: DC

## 2012-04-18 NOTE — Patient Instructions (Addendum)
New blood pressure medicine- Lisinopril  Make lab appointment for bloodwork in 1 week   Follow-up in one month  See Mental health provider If you are in need of services in [facility], call [phone]

## 2012-04-18 NOTE — Assessment & Plan Note (Signed)
Will start licinospirl 10 mg.  Husband and patient understadn to return in 1 week for bmet in in 1 month for BP recheck

## 2012-04-18 NOTE — Assessment & Plan Note (Signed)
Will continue PT, lyrica, and amitryptiline for sleep.

## 2012-04-18 NOTE — Progress Notes (Signed)
  Subjective:    Patient ID: Kristine Atkinson, female    DOB: 1955-11-30, 57 y.o.   MRN: [id number]  HPI Here for follow-up of chronic pain and elevated blood pressure  Elevated blood pressure. Remains elevated today, lower on recheck.  Does not check at home.  No chest pain, dyspnea, edema, PND BP Readings from Last 3 Encounters:  04/18/12 148/80  03/02/12 128/83  12/09/11 140/80     Chronic pain;  continues to take lyrica and amitriptyline at night.  Reports continued pain, but better than previous.  Reports going to physical therapy has helped.  DIABETES  Taking and tolerating: yes Fasting blood sugars:no  Hypoglycemic symptoms: no Visual problems: no  Diabetic Labs:  Lab Results  Component Value Date   HGBA1C 6.5 04/18/2012   HGBA1C 6.3 12/09/2011   HGBA1C 6.2 09/03/2011   Lab Results  Component Value Date   MICROALBUR 0.91 07/14/2011   LDLCALC 147* 07/14/2011   CREATININE 1.04 03/02/2012   Last microalbumin: Lab Results  Component Value Date   MICROALBUR 0.91 07/14/2011     Irritable mood: reports continued irritability.  Unabel to describe further.  Never contact [facility] as discussed.  Gives no reason why  Review of Systems See HPI    Objective:   Physical Exam GEN: Alert & Oriented, No acute distress, here with her husband CV:  Regular Rate & Rhythm, no murmur Respiratory:  Normal work of breathing, CTAB Abd:  + BS, soft, no tenderness to palpation Ext: no pre-tibial edema Psych:  At baseline which is labile mood, asks same question multiple times, tangential, non linear thought process.  Very difficult to obtain a history.        Assessment & Plan:

## 2012-04-18 NOTE — Assessment & Plan Note (Signed)
Again advised to contact [facility] for further evaluation.

## 2012-04-18 NOTE — Assessment & Plan Note (Signed)
Well controlled. -continue current meds  

## 2012-04-19 ENCOUNTER — Ambulatory Visit: Payer: No Typology Code available for payment source | Admitting: Physical Therapy

## 2012-04-21 ENCOUNTER — Ambulatory Visit: Payer: No Typology Code available for payment source | Admitting: Physical Therapy

## 2012-04-25 ENCOUNTER — Other Ambulatory Visit: Payer: No Typology Code available for payment source

## 2012-04-25 DIAGNOSIS — I10 Essential (primary) hypertension: Secondary | ICD-10-CM

## 2012-04-25 LAB — BASIC METABOLIC PANEL
BUN: 12 mg/dL (ref 6–23)
Calcium: 9.3 mg/dL (ref 8.4–10.5)
Creat: 1.09 mg/dL (ref 0.50–1.10)
Glucose, Bld: 122 mg/dL — ABNORMAL HIGH (ref 70–99)
Potassium: 4.1 mEq/L (ref 3.5–5.3)

## 2012-04-25 NOTE — Progress Notes (Signed)
BMP DONE TODAY [place] 

## 2012-04-26 ENCOUNTER — Encounter: Payer: Self-pay | Admitting: Family Medicine

## 2012-05-04 ENCOUNTER — Telehealth: Payer: Self-pay | Admitting: Family Medicine

## 2012-05-04 NOTE — Telephone Encounter (Signed)
I sent a year's refill in February.  She needs to contact [facility] and have them fax [place] a form if they have additional needs.

## 2012-05-04 NOTE — Telephone Encounter (Signed)
Contacted [facility] @ [phone].  They sent out the refill and pts husband signed for it on 03/09/12.  When I called to tell pts husband he said "well yes but she take 3 a day" I advised that she should not be taking 3 a day, that when she was taking 100mg  she was taking 3 a day but when dosage was increased to 225mg  she should only be taking 2 a day.  Pt husband is confused by this but then states that "they only sent out 90 capsules"  Again advised that they should have sent 180.  Per pt he is reading the label and it says 90 capsules.  Apologized and explained that this would have to be settled with [facility] since our rx requested 180, not 90.  Pts husband will call tomorrow. Yancey Pedley, Maryjo [place]

## 2012-05-04 NOTE — Telephone Encounter (Signed)
Patient is calling because when she saw Dr. Earnest Bailey on 4/7 she discussed needing her Lyrica Rx sent to [facility] for her 3 month supply of Lyrica, but she hasn't gotten her medication so she isn't sure that the Rx was sent.

## 2012-05-16 ENCOUNTER — Ambulatory Visit: Payer: No Typology Code available for payment source | Admitting: Family Medicine

## 2012-05-17 ENCOUNTER — Emergency Department (HOSPITAL_COMMUNITY)
Admission: EM | Admit: 2012-05-17 | Discharge: 2012-05-17 | Disposition: A | Discharge status: Home / Self Care | Payer: No Typology Code available for payment source | Attending: Emergency Medicine | Admitting: Emergency Medicine

## 2012-05-17 ENCOUNTER — Encounter (HOSPITAL_COMMUNITY): Payer: Self-pay | Admitting: Emergency Medicine

## 2012-05-17 ENCOUNTER — Emergency Department (HOSPITAL_COMMUNITY): Payer: No Typology Code available for payment source

## 2012-05-17 ENCOUNTER — Ambulatory Visit: Payer: No Typology Code available for payment source | Admitting: Family Medicine

## 2012-05-17 DIAGNOSIS — W010XXA Fall on same level from slipping, tripping and stumbling without subsequent striking against object, initial encounter: Secondary | ICD-10-CM | POA: Insufficient documentation

## 2012-05-17 DIAGNOSIS — S79929A Unspecified injury of unspecified thigh, initial encounter: Secondary | ICD-10-CM | POA: Insufficient documentation

## 2012-05-17 DIAGNOSIS — Z79899 Other long term (current) drug therapy: Secondary | ICD-10-CM | POA: Insufficient documentation

## 2012-05-17 DIAGNOSIS — S79912A Unspecified injury of left hip, initial encounter: Secondary | ICD-10-CM

## 2012-05-17 DIAGNOSIS — Y939 Activity, unspecified: Secondary | ICD-10-CM | POA: Insufficient documentation

## 2012-05-17 DIAGNOSIS — Y9289 Other specified places as the place of occurrence of the external cause: Secondary | ICD-10-CM | POA: Insufficient documentation

## 2012-05-17 DIAGNOSIS — G8929 Other chronic pain: Secondary | ICD-10-CM | POA: Insufficient documentation

## 2012-05-17 DIAGNOSIS — S79919A Unspecified injury of unspecified hip, initial encounter: Secondary | ICD-10-CM | POA: Insufficient documentation

## 2012-05-17 DIAGNOSIS — I10 Essential (primary) hypertension: Secondary | ICD-10-CM | POA: Insufficient documentation

## 2012-05-17 DIAGNOSIS — S8992XA Unspecified injury of left lower leg, initial encounter: Secondary | ICD-10-CM

## 2012-05-17 DIAGNOSIS — E119 Type 2 diabetes mellitus without complications: Secondary | ICD-10-CM | POA: Insufficient documentation

## 2012-05-17 DIAGNOSIS — F17200 Nicotine dependence, unspecified, uncomplicated: Secondary | ICD-10-CM | POA: Insufficient documentation

## 2012-05-17 DIAGNOSIS — W19XXXA Unspecified fall, initial encounter: Secondary | ICD-10-CM

## 2012-05-17 DIAGNOSIS — S8990XA Unspecified injury of unspecified lower leg, initial encounter: Secondary | ICD-10-CM | POA: Insufficient documentation

## 2012-05-17 HISTORY — DX: Fibromyalgia: M79.7

## 2012-05-17 HISTORY — DX: Essential (primary) hypertension: I10

## 2012-05-17 MED ORDER — TRAMADOL HCL 50 MG PO TABS: 50.0000 mg | ORAL_TABLET | Freq: Four times a day (QID) | ORAL | Status: DC | PRN

## 2012-05-17 MED ORDER — CYCLOBENZAPRINE HCL 10 MG PO TABS: 10.0000 mg | ORAL_TABLET | Freq: Two times a day (BID) | ORAL | Status: DC | PRN

## 2012-05-17 MED ORDER — HYDROCODONE-ACETAMINOPHEN 5-325 MG PO TABS
2.0000 | ORAL_TABLET | Freq: Once | ORAL | Status: AC
  Administered 2012-05-17: 2 via ORAL
  Filled 2012-05-17: qty 2

## 2012-05-17 NOTE — ED Provider Notes (Signed)
Medical screening examination/treatment/procedure(s) were performed by non-physician practitioner and as supervising physician I was immediately available for consultation/collaboration.  Olivia Mackie, MD 05/17/12 2031

## 2012-05-17 NOTE — ED Provider Notes (Signed)
History     CSN: [id number]  Arrival date & time 05/17/12  4098   First MD Initiated Contact with Patient 05/17/12 [phone]      Chief Complaint  Patient presents with  . Fall    (Consider location/radiation/quality/duration/timing/severity/associated sxs/prior treatment) HPI Comments: Patient is a 57 year old female who presents with joint pain after a fall that occurred yesterday. Patient reports slipping on a wet floor and landing on her left hip. She reports pain at her left hip, left knee, and right shin. The pain is throbbing, severe, and does not radiate. Patient has not tried anything for pain relief. Movement and weight bearing makes the pain worse. Nothing makes the pain better. Patient denies head trauma or LOC.    Past Medical History  Diagnosis Date  . Chronic pain   . Diabetes mellitus   . Hypertension   . Fibromyalgia     Past Surgical History  Procedure Laterality Date  . Oophorectomy      ectopic    Family History  Problem Relation Age of Onset  . Cancer Maternal Aunt     colon cancer  . Hypertension Mother     History  Substance Use Topics  . Smoking status: Current Every Day Smoker -- 0.50 packs/day  . Smokeless tobacco: Never Used     Comment: as of 05/13/11 about 3 cig a day  . Alcohol Use: No    OB History   Grav Para Term Preterm Abortions TAB SAB Ect Mult Living                  Review of Systems  Musculoskeletal: Positive for arthralgias.  All other systems reviewed and are negative.    Allergies  Penicillins  Home Medications   Current Outpatient Rx  Name  Route  Sig  Dispense  Refill  . amitriptyline (ELAVIL) 75 MG tablet   Oral   Take 1 tablet (75 mg total) by mouth at bedtime.   30 tablet   5   . lisinopril (PRINIVIL,ZESTRIL) 10 MG tablet   Oral   Take 1 tablet (10 mg total) by mouth daily.   30 tablet   0   . metFORMIN (GLUCOPHAGE) 500 MG tablet   Oral   Take 500 mg by mouth 2 (two) times daily with a meal.          . pregabalin (LYRICA) 225 MG capsule   Oral   Take 1 capsule (225 mg total) by mouth 2 (two) times daily.   180 capsule   3     Lyrica is for a three month supply with 3 refilsl. ...     BP 125/81  Pulse 94  Temp(Src) 98.3 F (36.8 C) (Oral)  Resp 16  SpO2 94%  Physical Exam  Nursing note and vitals reviewed. Constitutional: She is oriented to person, place, and time. She appears well-developed and well-nourished. No distress.  HENT:  Head: Normocephalic and atraumatic.  Eyes: Conjunctivae are normal.  Neck: Normal range of motion. Neck supple.  Cardiovascular: Normal rate and regular rhythm.  Exam reveals no gallop and no friction rub.   No murmur heard. Pulmonary/Chest: Effort normal and breath sounds normal. She has no wheezes. She has no rales. She exhibits no tenderness.  Abdominal: Soft. She exhibits no distension. There is no tenderness. There is no rebound.  Musculoskeletal: Normal range of motion.  Left anterior hip tender to palpation. Left anterior knee tender to palpation. Right shin tenderness to palpation.  No obvious deformity, edema or wounds.   Neurological: She is alert and oriented to person, place, and time. Coordination normal.  Extremity strength and sensation equal and intact bilaterally. Speech is goal-oriented. Moves limbs without ataxia.   Skin: Skin is warm and dry.  Psychiatric: She has a normal mood and affect. Her behavior is normal.    ED Course  Procedures (including critical care time)  Labs Reviewed - No data to display Dg Hip Complete Left  05/17/2012  *RADIOLOGY REPORT*  Clinical Data: Larey Seat.  Left hip pain.  LEFT HIP - COMPLETE 2+ VIEW  Comparison: None  Findings: Both hips are normally located.  Moderate degenerative changes.  The pubic symphysis and SI joints are intact.  No definite hip or pelvic fracture.  IMPRESSION: No acute bony findings.   Original Report Authenticated By: Rudie Meyer, M.D.    Dg Tibia/fibula Right  05/17/2012   *RADIOLOGY REPORT*  Clinical Data: Larey Seat.  Right leg pain.  RIGHT TIBIA AND FIBULA - 2 VIEW  Comparison: None  Findings: The knee and ankle joints are maintained.  No acute fracture of the tibia or fibula is identified.  IMPRESSION: No acute bony findings.   Original Report Authenticated By: Rudie Meyer, M.D.    Dg Knee Complete 4 Views Left  05/17/2012  *RADIOLOGY REPORT*  Clinical Data: Larey Seat.  Left knee pain.  LEFT KNEE - COMPLETE 4+ VIEW  Comparison: None  Findings: The joint spaces are maintained.  No acute fracture, osteochondral lesion or joint effusion.  IMPRESSION: No acute bony findings.   Original Report Authenticated By: Rudie Meyer, M.D.      1. Fall, initial encounter   2. Hip injury, left, initial encounter   3. Left knee injury, initial encounter       MDM  6:55 AM Xrays pending.   8:02 AM Xrays unremarkable for acute changes. Patient likely has bruising at areas where she fell. Patient will have pain medications for pain relief. No neurovascular compromise. Patient instructed to follow up with PCP as needed.       Emilia Beck, [place] 05/17/12 [phone]

## 2012-05-17 NOTE — ED Notes (Signed)
Pt reports she fell yesterday evening injuring her left knee and right shin and left hip. Pt denies hitting head or loc.

## 2012-05-18 IMAGING — CR DG ABDOMEN ACUTE W/ 1V CHEST
3 series · 3 of 3 positions shown · non-contrast
Comparison: None.

CLINICAL DATA: Chest pain, abdominal and back pain.

ACUTE ABDOMEN SERIES (ABDOMEN 2 VIEW & CHEST 1 VIEW)

[w chest pa]
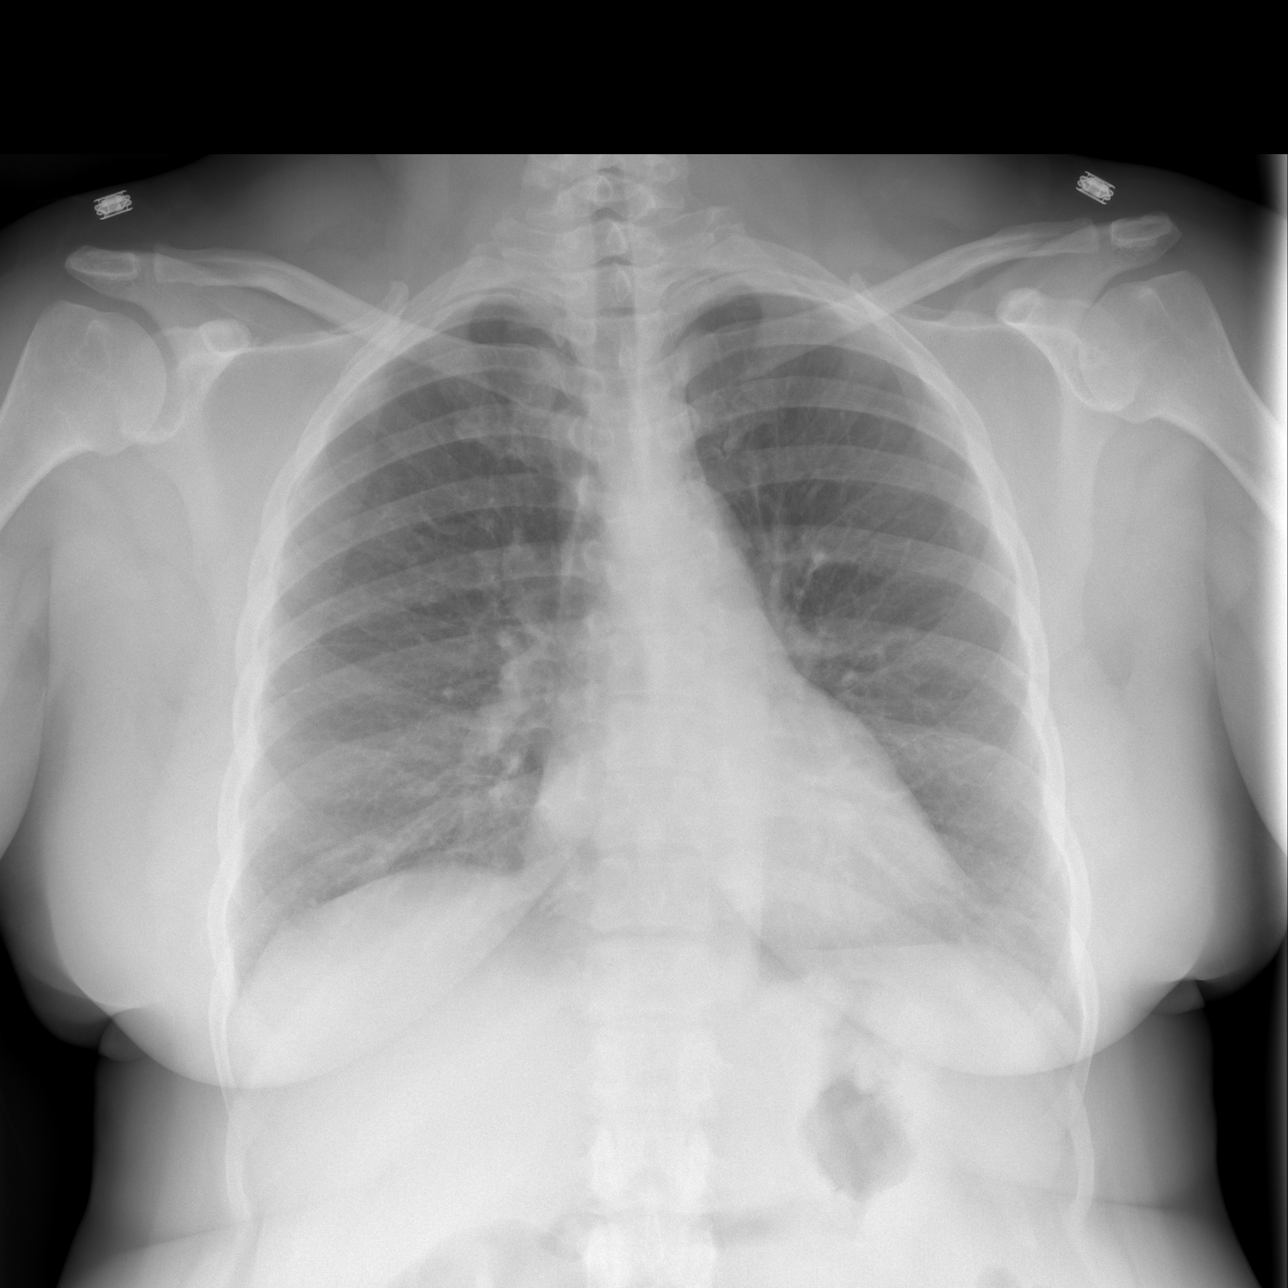

[w abdomen upright *]
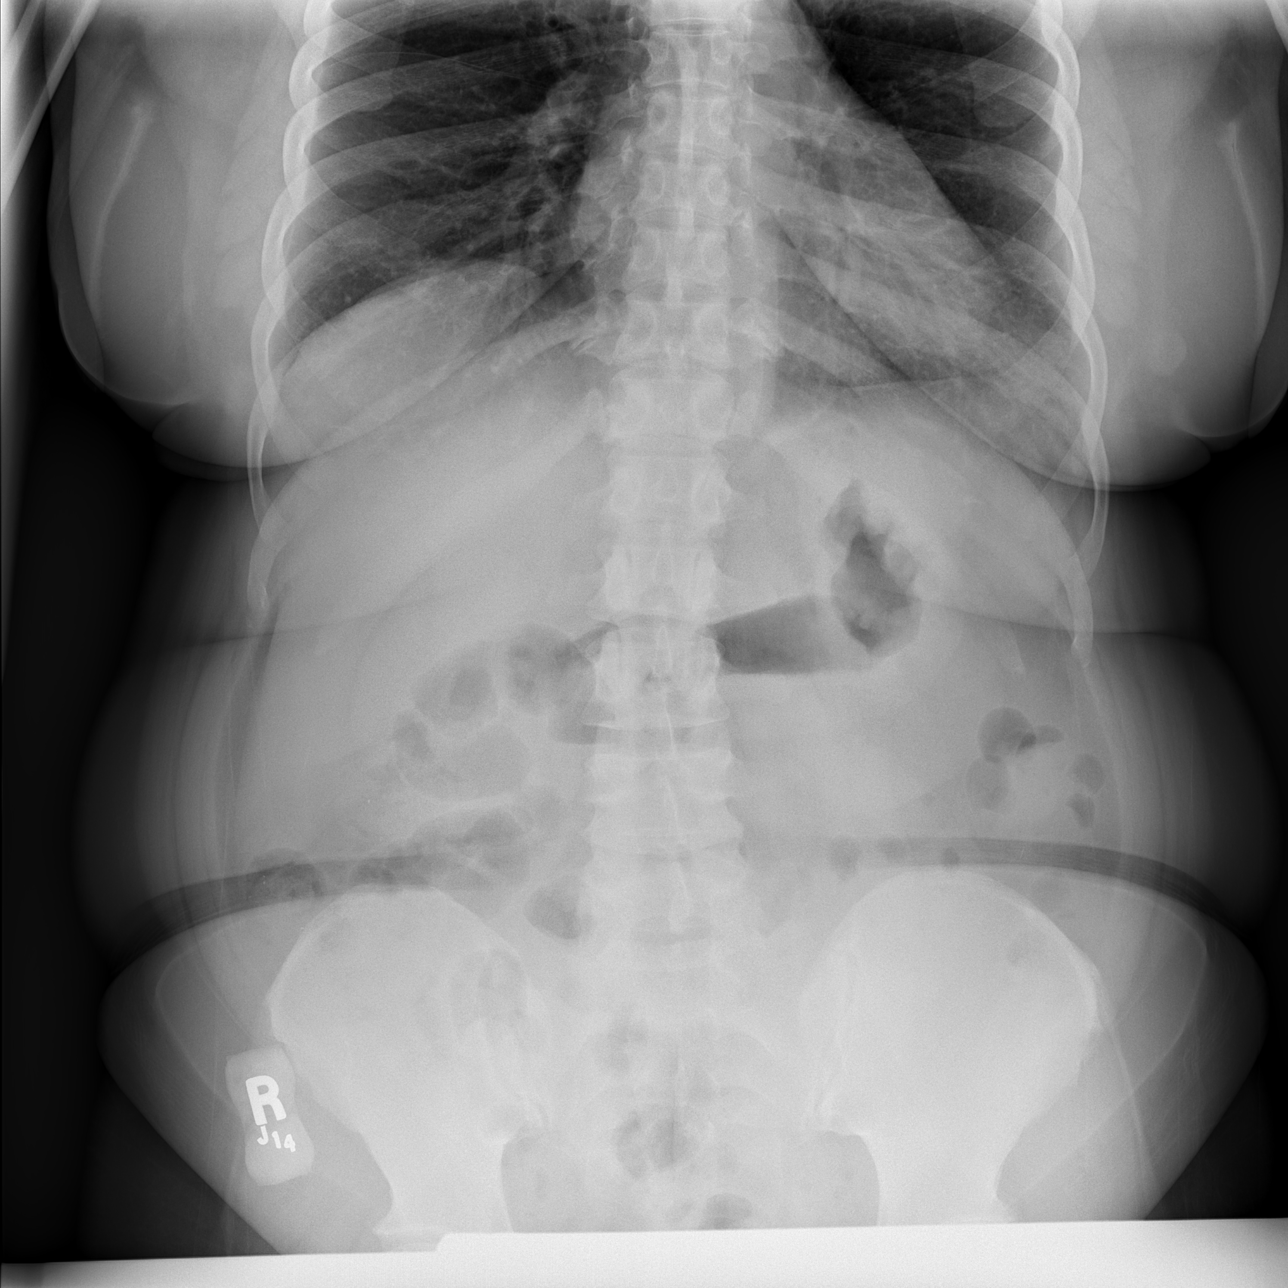

[t abdomen supine]
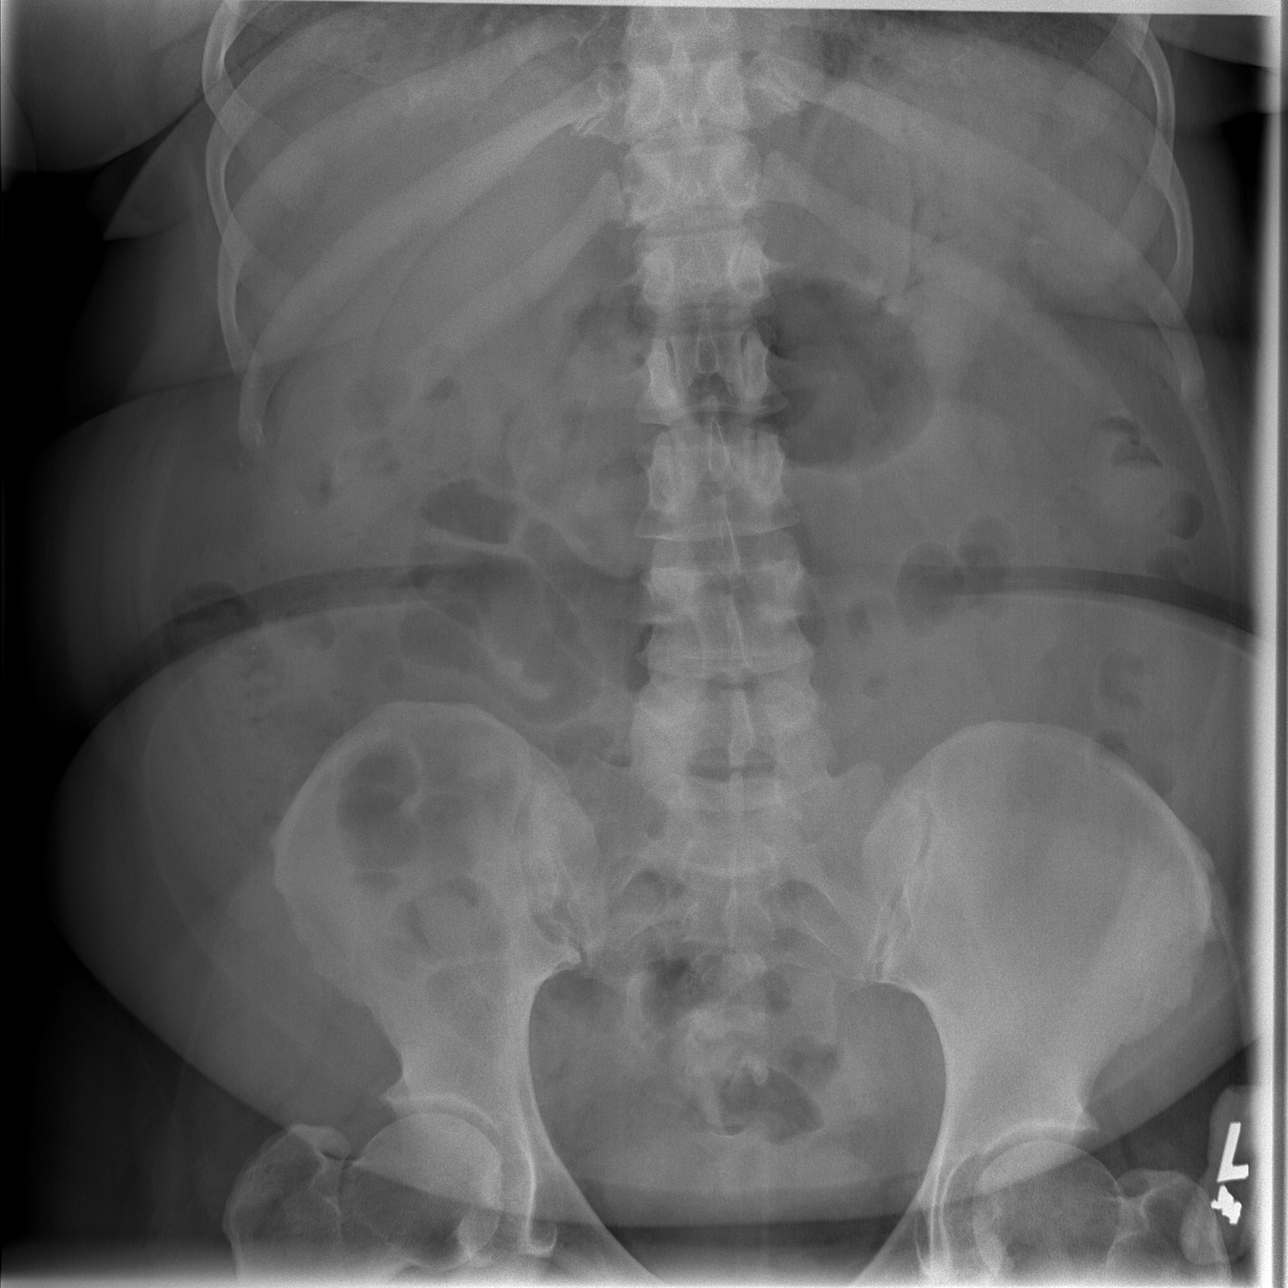

[3 of 3 positions shown; findings below may reference images not displayed]

FINDINGS: Trachea is midline.  Heart size normal.  Lungs are clear.
No pleural fluid.

Two views of the abdomen show gas in minimally prominent small
bowel.  Gas and stool are seen in the colon.
IMPRESSION: Question constipation.

## 2012-05-20 ENCOUNTER — Telehealth: Payer: Self-pay | Admitting: Family Medicine

## 2012-05-20 DIAGNOSIS — I10 Essential (primary) hypertension: Secondary | ICD-10-CM

## 2012-05-20 MED ORDER — LISINOPRIL 10 MG PO TABS: 10.0000 mg | ORAL_TABLET | Freq: Every day | ORAL | Status: DC

## 2012-05-20 NOTE — Telephone Encounter (Signed)
i have refilled her lisinopril.  Please inform patient.

## 2012-05-20 NOTE — Telephone Encounter (Signed)
Left message for pt that rx was sent into pharmacy.

## 2012-05-20 NOTE — Telephone Encounter (Signed)
Patient thought her appt was today with Earnest Bailey but it is next Friday and she is out of her blood pressure medicine.  Could it be called in?  She uses [facility] on [address].

## 2012-05-27 ENCOUNTER — Telehealth: Payer: Self-pay | Admitting: Family Medicine

## 2012-05-27 ENCOUNTER — Ambulatory Visit (INDEPENDENT_AMBULATORY_CARE_PROVIDER_SITE_OTHER): Payer: No Typology Code available for payment source | Admitting: Family Medicine

## 2012-05-27 ENCOUNTER — Encounter: Payer: Self-pay | Admitting: Family Medicine

## 2012-05-27 VITALS — BP 132/81 | HR 92 | Ht 62.0 in | Wt 207.0 lb

## 2012-05-27 DIAGNOSIS — E119 Type 2 diabetes mellitus without complications: Secondary | ICD-10-CM

## 2012-05-27 DIAGNOSIS — M797 Fibromyalgia: Secondary | ICD-10-CM

## 2012-05-27 DIAGNOSIS — I10 Essential (primary) hypertension: Secondary | ICD-10-CM

## 2012-05-27 DIAGNOSIS — IMO0001 Reserved for inherently not codable concepts without codable children: Secondary | ICD-10-CM

## 2012-05-27 MED ORDER — PREGABALIN 225 MG PO CAPS: 225.0000 mg | ORAL_CAPSULE | Freq: Two times a day (BID) | ORAL | Status: DC

## 2012-05-27 NOTE — Assessment & Plan Note (Signed)
Will get retinal scan in office today for screening

## 2012-05-27 NOTE — Telephone Encounter (Signed)
Our office does not complete patient assistance med forms.  Will place patient's application at front desk to pick up.  Patient has orange card.  Will need to contact [facility] MAP.  Will also leave MAP info form at front desk detailing documentation patient needs to bring and days/times to apply.  Called and left message to call our office back.  Gaylene Brooks, RN

## 2012-05-27 NOTE — Patient Instructions (Addendum)
Your blood pressure looks great  Come back in 3 months to meet your new doctor

## 2012-05-27 NOTE — Progress Notes (Signed)
  Subjective:    Patient ID: Kristine Atkinson, female    DOB: 1955/07/15, 58 y.o.   MRN: [id number]  HPI Here for re-eval of blood pressure and fibromyalgia  HYPERTENSION Started lisinopril 10 mg at last visit.   Repeat Cr/K 1 week later was normal.   BP Readings from Last 3 Encounters:  05/27/12 132/81  05/17/12 126/82  04/18/12 148/80    Hypertension ROS: taking medications as instructed, no medication side effects noted, patient does not perform home BP monitoring, no chest pain on exertion and no dyspnea on exertion.   Fibromyalgia: stable: brings in patient assistance paperwork from [employer] for lyrica assistance.  Reports doing well.  Fall:  Slipped in puddle of water at grocery store.  Hurt her knees. Was evaluated in ER, reports doing well with some residual pain, improving   Review of Systems See HPI    Objective:   Physical Exam GEN: NAD CV: RRR, no m/r/g       Assessment & Plan:

## 2012-05-27 NOTE — Addendum Note (Signed)
Addended byArlyss Repress on: 05/27/2012 09:22 AM   Modules accepted: Orders

## 2012-05-27 NOTE — Telephone Encounter (Signed)
Patient dropped off papers to be filled out for medication.  Please call when completed.

## 2012-05-27 NOTE — Assessment & Plan Note (Signed)
Doing well on lisinopril.  FOllow-up in 3months

## 2012-05-27 NOTE — Assessment & Plan Note (Signed)
Gave her Rx and referred her to [place] program to obtain assistance through [employer].

## 2012-06-03 ENCOUNTER — Ambulatory Visit (HOSPITAL_COMMUNITY)
Admission: RE | Admit: 2012-06-03 | Discharge: 2012-06-03 | Disposition: A | Discharge status: Home / Self Care | Payer: No Typology Code available for payment source | Source: Ambulatory Visit | Attending: Family Medicine | Admitting: Family Medicine

## 2012-06-03 ENCOUNTER — Ambulatory Visit (INDEPENDENT_AMBULATORY_CARE_PROVIDER_SITE_OTHER): Payer: No Typology Code available for payment source | Admitting: Family Medicine

## 2012-06-03 ENCOUNTER — Encounter: Payer: Self-pay | Admitting: Family Medicine

## 2012-06-03 ENCOUNTER — Ambulatory Visit: Payer: No Typology Code available for payment source | Admitting: Family Medicine

## 2012-06-03 VITALS — BP 105/77 | HR 96 | Temp 98.8°F | Ht 62.0 in | Wt 211.0 lb

## 2012-06-03 DIAGNOSIS — R0789 Other chest pain: Secondary | ICD-10-CM | POA: Insufficient documentation

## 2012-06-03 DIAGNOSIS — I498 Other specified cardiac arrhythmias: Secondary | ICD-10-CM | POA: Insufficient documentation

## 2012-06-03 DIAGNOSIS — R198 Other specified symptoms and signs involving the digestive system and abdomen: Secondary | ICD-10-CM

## 2012-06-03 DIAGNOSIS — R079 Chest pain, unspecified: Secondary | ICD-10-CM

## 2012-06-03 MED ORDER — OMEPRAZOLE 20 MG PO CPDR
40.0000 mg | DELAYED_RELEASE_CAPSULE | Freq: Every day | ORAL | Status: DC
Start: 2012-06-03 — End: 2012-08-04

## 2012-06-03 MED ORDER — GI COCKTAIL ~~LOC~~
30.0000 mL | Freq: Once | ORAL | Status: AC
  Administered 2012-06-03: 30 mL via ORAL

## 2012-06-03 NOTE — Patient Instructions (Addendum)
You should start taking omeprazole. [facility]. You can try tums (calcium carbonate) for acid reflux also. If you cannot afford medication, call the doctor. If you have chest pain not relieved by tums or mylanta, severe shortness of breath then go to the Emergency room. You can take tylenol for your back pain.  Avoid drinking soda, spicy food, caffeine. Drink plenty of water.  Make an appointment with Dr. Delbert Harness, MD in 4-5 days.  Diet for Gastroesophageal Reflux Disease, Adult Reflux is when stomach acid flows up into the esophagus. The esophagus becomes irritated and sore (inflammation). When reflux happens often and is severe, it is called gastroesophageal reflux disease (GERD). What you eat can help ease any discomfort caused by GERD. FOODS OR DRINKS TO AVOID OR LIMIT  Coffee and black tea, with or without caffeine.  Bubbly (carbonated) drinks with caffeine or energy drinks.  Strong spices, such as pepper, cayenne pepper, curry, or chili powder.  Peppermint or spearmint.  Chocolate.  High-fat foods, such as meats, fried food, oils, butter, or nuts.  Fruits and vegetables that cause discomfort. This includes citrus fruits and tomatoes.  Alcohol. If a certain food or drink irritates your GERD, avoid eating or drinking it. THINGS THAT MAY HELP GERD INCLUDE:  Eat meals slowly.  Eat 5 to 6 small meals a day, not 3 large meals.  Do not eat food for a certain amount of time if it causes discomfort.  Wait 3 hours after eating before lying down.  Keep the head of your bed raised 6 to 9 inches (15 23 centimeters). Put a foam wedge or blocks under the legs of the bed.  Stay active. Weight loss, if needed, may help ease your discomfort.  Wear loose-fitting clothing.  Do not smoke or chew tobacco. Document Released: 06/30/2011 Document Reviewed: 06/30/2011 [facility] Patient Information 2014 [place], [place].

## 2012-06-03 NOTE — Progress Notes (Signed)
  Subjective:    Patient ID: Kristine Atkinson, female    DOB: April 17, 1955, 57 y.o.   MRN: [id number]  Gastrophageal Reflux  GI Problem  Significant associated medical issues include GERD.   patient is a difficult historian, presents with her husband today who helps.  57 year old female with diabetes and fibromyalgia presents with one week of difficulty swallowing, central chest pain, constipation. States she feels a burning in the middle of her chest. States she couldn't eat, drink, or swallow last week. Since the onset it has improved. States she does not have pain currently. She did finally have a bowel movement 2 days ago. She ran out of her blood pressure medication but has restarted this. She also ran out of her acid reflux pills. States she had similar symptoms previously until she started anti-reflex medication. She feels a pain in her chest improves when she drinks soda and burps. Also worsens when she lies down or eats something. Pain is unchanged with exertion. She ate pizza, pepsi and a donut in the past 24 hours. Daily soda use. Denies alcohol or NSAID use.  No previous history of cardiac problems.  Also complains of some chronic back pain and lower bowel discomfort.  Review of Systems See HPI otherwise negative.  reports that she has been smoking.  She has never used smokeless tobacco.     Objective:   Physical Exam  Vitals reviewed. Constitutional: She is oriented to person, place, and time. She appears well-developed and well-nourished. No distress.  HENT:  Head: Normocephalic and atraumatic.  Nose: Nose normal.  Mouth/Throat: Oropharynx is clear and moist. No oropharyngeal exudate.  Eyes: EOM are normal. Pupils are equal, round, and reactive to light. No scleral icterus.  Neck: Neck supple. No thyromegaly present.  Cardiovascular: Normal rate, regular rhythm and normal heart sounds.  Exam reveals no gallop.   No murmur heard. Pulmonary/Chest: Effort normal and breath  sounds normal. No respiratory distress. She has no wheezes. She has no rales.  Mild tenderness to palpation in the central sternum and epigastrium.  Abdominal: Soft. Bowel sounds are normal. She exhibits no distension and no mass. There is no tenderness. There is no rebound and no guarding.  Musculoskeletal: She exhibits no edema and no tenderness.  Neurological: She is alert and oriented to person, place, and time.  Skin: No rash noted. She is not diaphoretic.   EKG: sinus tachycardia, rate 103, no ST or T wave changes to suggest ischemia. Normal axis and R wave progression.       Assessment & Plan:

## 2012-06-03 NOTE — Assessment & Plan Note (Signed)
Given the chronicity of her complaint, normal EKG, and primarily GI-associated symptoms and her history of GERD the most likely diagnosis is gastroesophagitis. She has been off her PPI, and relates symptoms to food intake. Much less likely a cardiac or pulmonary issues-non-exertional and not relieved by rest. A GI cocktail is administered with some improvement today. I will restart PPI omeprazole 40 mg daily. May use TUMS when necessary. Discussed dietary restrictions to avoid further irritation. Advise patient to followup in clinic in 5-7 days with her PCP. If she develops chest pain unrelieved by TUMS, dyspnea or other red flag symptoms to present to the ER.

## 2012-06-15 ENCOUNTER — Ambulatory Visit (HOSPITAL_COMMUNITY)
Admission: RE | Admit: 2012-06-15 | Discharge: 2012-06-15 | Disposition: A | Discharge status: Home / Self Care | Payer: No Typology Code available for payment source | Source: Ambulatory Visit | Attending: Family Medicine | Admitting: Family Medicine

## 2012-06-15 ENCOUNTER — Ambulatory Visit (INDEPENDENT_AMBULATORY_CARE_PROVIDER_SITE_OTHER): Payer: No Typology Code available for payment source | Admitting: Family Medicine

## 2012-06-15 ENCOUNTER — Encounter: Payer: Self-pay | Admitting: Family Medicine

## 2012-06-15 VITALS — BP 110/60 | HR 100 | Temp 98.9°F | Ht 62.0 in | Wt 216.0 lb

## 2012-06-15 DIAGNOSIS — R059 Cough, unspecified: Secondary | ICD-10-CM

## 2012-06-15 DIAGNOSIS — I10 Essential (primary) hypertension: Secondary | ICD-10-CM

## 2012-06-15 DIAGNOSIS — R05 Cough: Secondary | ICD-10-CM

## 2012-06-15 DIAGNOSIS — I517 Cardiomegaly: Secondary | ICD-10-CM | POA: Insufficient documentation

## 2012-06-15 DIAGNOSIS — IMO0001 Reserved for inherently not codable concepts without codable children: Secondary | ICD-10-CM

## 2012-06-15 DIAGNOSIS — R0789 Other chest pain: Secondary | ICD-10-CM

## 2012-06-15 DIAGNOSIS — M797 Fibromyalgia: Secondary | ICD-10-CM

## 2012-06-15 MED ORDER — LISINOPRIL 10 MG PO TABS: 10.0000 mg | ORAL_TABLET | Freq: Every day | ORAL | Status: DC

## 2012-06-15 MED ORDER — AMITRIPTYLINE HCL 75 MG PO TABS: 75.0000 mg | ORAL_TABLET | Freq: Every day | ORAL | Status: DC

## 2012-06-15 MED ORDER — AZITHROMYCIN 250 MG PO TABS: ORAL_TABLET | ORAL | Status: DC

## 2012-06-15 MED ORDER — POLYETHYLENE GLYCOL 3350 17 GM/SCOOP PO POWD: 17.0000 g | Freq: Two times a day (BID) | ORAL | Status: DC | PRN

## 2012-06-15 NOTE — Assessment & Plan Note (Addendum)
Patient with multiple varied pain complaints today. Her main concern is getting the Lyrica paperwork completed, which I have done. I signed the paperwork for the [employer] patient assistance program, and returned them to the patient today to avoid lapse in her treatment. Patient also requests I send in the 75 mg tablets of Elavil to her pharmacy so she doesn't have to take 3 pills (25 mg tabs were dispensed at health department for their formulary ). Urged patient to followup with PCP in 1-2 weeks.

## 2012-06-15 NOTE — Patient Instructions (Addendum)
Get your chest xray at [facility] first floor. Important to quit smoking. Start taking azithromycin for antibiotic.  Make an appointment in 1-2 week with Dessa Phi, MD

## 2012-06-15 NOTE — Assessment & Plan Note (Signed)
This is resolved. Likely secondary to uncontrolled GERD or esophagitis. Continue her PPI.

## 2012-06-15 NOTE — Assessment & Plan Note (Signed)
2 weeks of worsening symptoms in a smoker with rales. Will check chest x-ray to rule out pneumonia. At the least this is likely bronchitis without evidence of bronchospasm. Treat with azithromycin. Advised smoking cessation. Followup in one to 2 weeks with PCP if not improving.

## 2012-06-15 NOTE — Progress Notes (Signed)
  Subjective:    Patient ID: Kristine Atkinson, female    DOB: 1955/09/01, 57 y.o.   MRN: [id number]  URI   Shortness of Breath  Arm Pain    patient with multiple varied complaints today. Was scheduled as a follow up to GERD/burning chest pain from 2 weeks ago. Patient prioritizes her problems as listed.  1. Medication problems/paperwork. Brings packet of paperwork from [employer]. Dr. Earnest Bailey had originally ordered some Lyrica and the patient is attempting to acquire this. Patient request I filled this out so that she can continue the medication. She has not yet run out but soon well. She continues to endorse migrating musculoskeletal pains. This includes some right shoulder pain, left lower quadrant and groin pain.   2. Cough for 2 weeks. She is been coughing for 2 weeks. This is worsening. Sometimes productive of clear sputum. She endorses some chills and wheezing. Endorses mild exertional dyspnea. She is a smoker. There is not fever, nausea, chest pain, dyspnea at rest, leg swelling, weight loss, diaphoresis, nausea.  3. followup GERD. Patient states that she no longer has the chest pain she described 2 weeks ago. She is taking omeprazole, but is annoyed that the health department only had 20 mg capsules therefore has to take 2 pills a day. Any nausea, vomiting, abdominal pain. She endorses constipation and weight gain.   Review of Systems  Respiratory: Positive for shortness of breath.    See HPI otherwise negative.  reports that she has been smoking.  She has never used smokeless tobacco.     Objective:   Physical Exam  Vitals reviewed. Constitutional: She is oriented to person, place, and time. She appears well-developed and well-nourished. No distress.  Female appears somewhat anxious, she quickly jumps between her somatic complaints and is hard to follow.   HENT:  Head: Normocephalic and atraumatic.  Mouth/Throat: Oropharynx is clear and moist. No oropharyngeal  exudate.  Deep cough exhibited, clear sputum  Eyes: EOM are normal. Pupils are equal, round, and reactive to light.  Neck: Neck supple. No JVD present. No thyromegaly present.  Cardiovascular: Normal rate, regular rhythm and normal heart sounds.   No murmur heard. Pulmonary/Chest: Effort normal. No respiratory distress. She has no wheezes. She has rales.  Bibasilar rales, worse on right.  No chest wall TTP.   Abdominal: Soft. There is no tenderness. There is no rebound and no guarding.  Obese, mildly distended. Nontender.   Musculoskeletal: She exhibits no edema and no tenderness.  Neurological: She is alert and oriented to person, place, and time. Coordination normal.  Skin: No rash noted. She is not diaphoretic.       Assessment & Plan:

## 2012-06-19 ENCOUNTER — Other Ambulatory Visit: Payer: Self-pay | Admitting: Family Medicine

## 2012-06-20 ENCOUNTER — Telehealth: Payer: Self-pay | Admitting: *Deleted

## 2012-06-20 DIAGNOSIS — E119 Type 2 diabetes mellitus without complications: Secondary | ICD-10-CM

## 2012-06-20 MED ORDER — DOXYCYCLINE HYCLATE 100 MG PO TABS: 100.0000 mg | ORAL_TABLET | Freq: Two times a day (BID) | ORAL | Status: DC

## 2012-06-20 MED ORDER — METFORMIN HCL 500 MG PO TABS: 500.0000 mg | ORAL_TABLET | Freq: Two times a day (BID) | ORAL | Status: DC

## 2012-06-20 NOTE — Telephone Encounter (Signed)
I was asked by triage nurse to address patient's inability to purchase azithromycin for cough with rales, recently seen in this office on June 4th.  She continues with cough and not improving. I see she was ordered for a chest x-ray, which is not resulted in Epic.  Patient prescription changed to doxycycline 100mg  twice daily for 10 days, she is to follow up in this office with her primary physician within the week, or sooner if not better.  JB

## 2012-06-20 NOTE — Telephone Encounter (Signed)
Requested Prescriptions   Signed Prescriptions Disp Refills  . metFORMIN (GLUCOPHAGE) 500 MG tablet 60 tablet 0    Sig: Take 1 tablet (500 mg total) by mouth 2 (two) times daily with a meal.    Authorizing Provider: Barbaraann Barthel    Ordering User: Bonita Quin E   Pt must make appointment for future refills Wyatt Haste, RN-BSN   Please send new script for Doxycyline ( due to pt being unable to afford Azithromycin) to [facility]. Thanks! Wyatt Haste, RN-BSN

## 2012-06-28 ENCOUNTER — Ambulatory Visit (INDEPENDENT_AMBULATORY_CARE_PROVIDER_SITE_OTHER): Payer: No Typology Code available for payment source | Admitting: Family Medicine

## 2012-06-28 ENCOUNTER — Encounter: Payer: Self-pay | Admitting: Family Medicine

## 2012-06-28 VITALS — BP 122/80 | HR 103 | Ht 62.5 in | Wt 212.0 lb

## 2012-06-28 DIAGNOSIS — IMO0001 Reserved for inherently not codable concepts without codable children: Secondary | ICD-10-CM

## 2012-06-28 DIAGNOSIS — Z720 Tobacco use: Secondary | ICD-10-CM

## 2012-06-28 DIAGNOSIS — M797 Fibromyalgia: Secondary | ICD-10-CM

## 2012-06-28 DIAGNOSIS — F17200 Nicotine dependence, unspecified, uncomplicated: Secondary | ICD-10-CM

## 2012-06-28 DIAGNOSIS — I10 Essential (primary) hypertension: Secondary | ICD-10-CM

## 2012-06-28 DIAGNOSIS — E119 Type 2 diabetes mellitus without complications: Secondary | ICD-10-CM

## 2012-06-28 MED ORDER — AMITRIPTYLINE HCL 75 MG PO TABS: 75.0000 mg | ORAL_TABLET | Freq: Every day | ORAL | Status: DC

## 2012-06-28 MED ORDER — DOXYCYCLINE HYCLATE 100 MG PO TABS: 100.0000 mg | ORAL_TABLET | Freq: Two times a day (BID) | ORAL | Status: DC

## 2012-06-28 NOTE — Patient Instructions (Addendum)
Mrs. Vieyra,  Very nice to meet you. F/u in two months to discuss smoking cessation and urinary incontinence (leaking of urine)   Take prescription for doxycylince around to try to get it filled. It will help with your cough.  Dr. Armen Pickup

## 2012-06-28 NOTE — Progress Notes (Signed)
Subjective:     Patient ID: Kristine Atkinson, female   DOB: Feb 16, 1955, 57 y.o.   MRN: [id number]  HPI 57 year-old female presents with her husband to discuss the following  #1 fibromyalgia: Patient has chronic pain in her legs. The pain is described as cramping from her hip to ankle. Crepitus the worse at night. For this she takes Elavil and Lyrica. She also takes tramadol Effexor as needed. She feels that the medication is helping very much her cramping. She requests a refill on her Elavil.  #2 Hypertension: Patient is compliant with her lisinopril. She denies chronic cough. She denies headache, chest pain, shortness of breath.  #3 Diabetes type 2: Patient compliant with metformin. She denies GI upset. She is only on oral therapy. She does not check her blood sugars.  #4: Tobacco abuse: Patient continues to smoke. She is not yet ready to quit. She does have intermittent nonproductive cough. She was seen in clinic 2 weeks ago and prescribed doxycycline for her cough. She was unable to work the medication. She denies fever, chest pain, shortness of breath and purulet sputum.  Review of Systems As per HPI     Objective:   Physical Exam BP 122/80  Pulse 103  Ht 5' 2.5" (1.588 m)  Wt 212 lb (96.163 kg)  BMI 38.13 kg/m2 General appearance: alert, cooperative and no distress Lungs: clear to auscultation bilaterally Heart: regular rate and rhythm, S1, S2 normal, no murmur, click, rub or gallop Abdomen: morbidly obese Extremities: extremities normal, atraumatic, no cyanosis or edema    Assessment and Plan:

## 2012-07-01 NOTE — Assessment & Plan Note (Signed)
A: Controlled on current medication regimen. P: I refilled her Elavil today.

## 2012-07-01 NOTE — Assessment & Plan Note (Signed)
A: well controlled P: continue current regimen  

## 2012-07-01 NOTE — Assessment & Plan Note (Addendum)
A: Patient encouraged to quit. Suspect her cough was a bronchitis/ early stage COPD. P: We discussed smoking cessation.

## 2012-07-01 NOTE — Assessment & Plan Note (Signed)
A: well controlled. Med: tolerating and compliant meds. P: continue current regimen.

## 2012-08-02 ENCOUNTER — Other Ambulatory Visit: Payer: Self-pay | Admitting: Family Medicine

## 2012-08-03 ENCOUNTER — Ambulatory Visit: Payer: No Typology Code available for payment source | Admitting: Family Medicine

## 2012-08-03 NOTE — Progress Notes (Unsigned)
Patient came for renewal of [facility] card.  Approved for 100% W/O from 08/03/12 thru 12/26/12

## 2012-08-04 ENCOUNTER — Ambulatory Visit (INDEPENDENT_AMBULATORY_CARE_PROVIDER_SITE_OTHER): Payer: No Typology Code available for payment source | Admitting: Family Medicine

## 2012-08-04 ENCOUNTER — Encounter: Payer: Self-pay | Admitting: Family Medicine

## 2012-08-04 VITALS — BP 147/69 | HR 98 | Temp 98.1°F | Ht 62.5 in | Wt 217.0 lb

## 2012-08-04 DIAGNOSIS — M797 Fibromyalgia: Secondary | ICD-10-CM

## 2012-08-04 DIAGNOSIS — IMO0001 Reserved for inherently not codable concepts without codable children: Secondary | ICD-10-CM

## 2012-08-04 DIAGNOSIS — I10 Essential (primary) hypertension: Secondary | ICD-10-CM

## 2012-08-04 DIAGNOSIS — E119 Type 2 diabetes mellitus without complications: Secondary | ICD-10-CM

## 2012-08-04 DIAGNOSIS — R05 Cough: Secondary | ICD-10-CM

## 2012-08-04 LAB — POCT GLYCOSYLATED HEMOGLOBIN (HGB A1C): Hemoglobin A1C: 6.5

## 2012-08-04 MED ORDER — OMEPRAZOLE 20 MG PO CPDR: 40.0000 mg | DELAYED_RELEASE_CAPSULE | Freq: Every day | ORAL | Status: DC

## 2012-08-04 MED ORDER — AMITRIPTYLINE HCL 75 MG PO TABS: 75.0000 mg | ORAL_TABLET | Freq: Every day | ORAL | Status: DC

## 2012-08-04 MED ORDER — LISINOPRIL 10 MG PO TABS: 10.0000 mg | ORAL_TABLET | Freq: Every day | ORAL | Status: DC

## 2012-08-04 MED ORDER — METFORMIN HCL 500 MG PO TABS: ORAL_TABLET | ORAL | Status: DC

## 2012-08-04 MED ORDER — PREGABALIN 225 MG PO CAPS: 225.0000 mg | ORAL_CAPSULE | Freq: Two times a day (BID) | ORAL | Status: DC

## 2012-08-04 MED ORDER — CYCLOBENZAPRINE HCL 10 MG PO TABS: 10.0000 mg | ORAL_TABLET | Freq: Every day | ORAL | Status: DC

## 2012-08-04 NOTE — Patient Instructions (Addendum)
Mrs. Googe,  Thank you for coming in today. Please see me in f/u in 3 months.  Dr. Armen Pickup

## 2012-08-05 NOTE — Progress Notes (Signed)
Subjective:     Patient ID: Kristine Atkinson, female   DOB: 02-12-1955, 57 y.o.   MRN: [id number]  HPI 58 year-old female presents with her husband for medication refills. She's no complaints today. She is agitated because she had to wait for her appointment.  #1 cough: Patient productive cough has improved. She never took the antibiotic as prescribed due to cost. She denies fever, chest pain, shortness of breath.  #2 diabetes: Patient is compliant with metformin. She is due for refills. She denies chest pain.  #3 fibromyalgia: Patient takes Lyrica for fibromyalgia. She is now unable to get this through the distal [place] medication assistance program. She is brought paperwork from the supplier for me to fill out with medication assistance.  Review of Systems As per history of present illness    Objective:   Physical Exam BP 147/69  Pulse 98  Temp(Src) 98.1 F (36.7 C) (Oral)  Ht 5' 2.5" (1.588 m)  Wt 217 lb (98.431 kg)  BMI 39.03 kg/m2 General appearance: alert, cooperative, no distress and moderately obese Lungs: clear to auscultation bilaterally Heart: regular rate and rhythm, S1, S2 normal, no murmur, click, rub or gallop    Assessment and Plan:

## 2012-08-09 ENCOUNTER — Telehealth: Payer: Self-pay | Admitting: Family Medicine

## 2012-08-09 MED ORDER — PREGABALIN 225 MG PO CAPS: 225.0000 mg | ORAL_CAPSULE | Freq: Two times a day (BID) | ORAL | Status: DC

## 2012-08-09 NOTE — Assessment & Plan Note (Signed)
A: well controlled. Meds: compliant. P: continue current regimen.

## 2012-08-09 NOTE — Telephone Encounter (Signed)
Palpitations spoke to her and her husband. She needs to sign a release of medical permission form for [employer] so I may send her medical information if needed.  The patient and her husband indicated that they will come to the clinic by 11 AM to sign the form.  Once signed please place the form in the to be scanned box so it can be added to the patient's medical record.

## 2012-08-09 NOTE — Assessment & Plan Note (Signed)
A: improved w/o antibiotic. Patient continues to smoke. P: d/c antibiotic Patient to f/u when ready to discuss smoking cessation.

## 2012-08-09 NOTE — Assessment & Plan Note (Addendum)
A: controlled with elavil and lyrica.  P: Refilled elavil.  [facility]

## 2012-09-07 ENCOUNTER — Telehealth: Payer: Self-pay | Admitting: Family Medicine

## 2012-09-07 MED ORDER — CYCLOBENZAPRINE HCL 10 MG PO TABS: 10.0000 mg | ORAL_TABLET | Freq: Every day | ORAL | Status: DC

## 2012-09-07 NOTE — Telephone Encounter (Signed)
Pt is calling back again because she still has not received a call from the nurse concerning her pain in her legs. JW

## 2012-09-07 NOTE — Telephone Encounter (Signed)
Pt called and informed. Elizabeth Ardon Franklin, RN-BSN  

## 2012-09-07 NOTE — Telephone Encounter (Signed)
Pt called because she is pain and would like the advice of a nurse to call her to discuss. JW

## 2012-09-07 NOTE — Telephone Encounter (Signed)
Flexeril refilled. Please inform patient.

## 2012-09-07 NOTE — Telephone Encounter (Signed)
Pt reports that she had cramps in her side and thighs last night - all night and is still hurting - pt is screaming and yelling -  "somebody needs to help me, I need somebody to help" States that she needs muscle relaxers filled - appointment made for tomorrow. Wyatt Haste, RN-BSN

## 2012-09-08 ENCOUNTER — Encounter: Payer: Self-pay | Admitting: Family Medicine

## 2012-09-08 ENCOUNTER — Ambulatory Visit (INDEPENDENT_AMBULATORY_CARE_PROVIDER_SITE_OTHER): Payer: No Typology Code available for payment source | Admitting: Family Medicine

## 2012-09-08 VITALS — BP 135/116 | HR 101 | Temp 98.1°F | Ht 62.5 in | Wt 218.8 lb

## 2012-09-08 DIAGNOSIS — R252 Cramp and spasm: Secondary | ICD-10-CM | POA: Insufficient documentation

## 2012-09-08 LAB — COMPREHENSIVE METABOLIC PANEL
AST: 31 U/L (ref 0–37)
Alkaline Phosphatase: 103 U/L (ref 39–117)
Glucose, Bld: 143 mg/dL — ABNORMAL HIGH (ref 70–99)
Potassium: 5 mEq/L (ref 3.5–5.3)
Sodium: 140 mEq/L (ref 135–145)
Total Bilirubin: 0.3 mg/dL (ref 0.3–1.2)
Total Protein: 7.4 g/dL (ref 6.0–8.3)

## 2012-09-08 LAB — POCT URINALYSIS DIPSTICK
Glucose, UA: NEGATIVE
Leukocytes, UA: NEGATIVE
Nitrite, UA: NEGATIVE
Protein, UA: NEGATIVE
Spec Grav, UA: 1.025
Urobilinogen, UA: 0.2

## 2012-09-08 MED ORDER — CYCLOBENZAPRINE HCL 10 MG PO TABS: 10.0000 mg | ORAL_TABLET | Freq: Three times a day (TID) | ORAL | Status: DC | PRN

## 2012-09-08 NOTE — Progress Notes (Signed)
Subjective:     Patient ID: Kristine Atkinson, female   DOB: 18-Aug-1955, 57 y.o.   MRN: [id number]  HPI Leg cramps: Patient complains of leg cramps for the last few weeks. She is an extremely poor historian. She is accompanied by her significant other, that is able to answer questions appropriately. They report she has been walking more often attempting to exercise to decrease her weight. She is completely preoccupied, during this visit with liposuction, BP and her weight. She reports she is in no pain currently with her legs. From history obtained it appears her leg cramps come at all times of the day and night and after walking. She drinks a "1/2 gallon" of water daily. She has also experienced costochondral cramping. Dr. Armen Pickup had called in flexeril yesterday, however [facility]. They attempted to call [facility] to get it transferred and they states they had not received it.   Review of Systems Negative, with the exception of above mentioned in HPI     Objective:   Physical Exam BP 135/116  Pulse 101  Temp(Src) 98.1 F (36.7 C) (Oral)  Ht 5' 2.5" (1.588 m)  Wt 218 lb 12.8 oz (99.247 kg)  BMI 39.36 kg/m2 Gen: tearful (about liposuction and weight). NAD. Psych: Inappropriate, labile, rapid speech, tangential, inattentive to conversation and questions, Speaks over provider explaining her questions with same question. Abnormal memory demonstrated, she was unable to repeat back to me and repeatedly (>5 times) asked this provider the same question. Significant other did not seem to react as if this was abnormal for her and he was able to re-explain to her what I said prior.  CV: RRR Chest: CTAB Ext: No edema or erythema. No tenderness with exam. Equal pulses and reflexes bilateral LE.

## 2012-09-08 NOTE — Assessment & Plan Note (Addendum)
-   In depth discussion with patient and her significant other of etiologies of leg cramps. From her history I was able to obtain, it does not sound like like classic PVD/claudication type symptoms. Possibly electrolyte imbalance or dehydration in nature.  - prescribed Flexeril to her pharmacy locally and [facility].  - Obtained CMP, UA and UDS today  - Concern for her mental health, appears she had been advised to contact [facility] in the past. UDS today to ensure not                     substance related.  - Encouraged her to drink at least 8- 8 ounces (64 ounces) of water a day, and more if she is working out or walking.   - Encouraged her to continue her workout/walk. - Follow up with PCP about her weight concerns and BP (which is elevated today); she has an appointment next week

## 2012-09-08 NOTE — Patient Instructions (Signed)
Leg Cramps Leg cramps that occur during exercise can be caused by poor circulation or dehydration. However, muscle cramps that occur at rest or during the night are usually not due to any serious medical problem. Heat cramps may cause muscle spasms during hot weather.  CAUSES There is no clear cause for muscle cramps. However, dehydration may be a factor for those who do not drink enough fluids and those who exercise in the heat. Imbalances in the level of sodium, potassium, calcium or magnesium in the muscle tissue may also be a factor. Some medications, such as water pills (diuretics), may cause loss of chemicals that the body needs (like sodium and potassium) and cause muscle cramps. TREATMENT   Make sure your diet has enough fluids and essential minerals for the muscle to work normally.  Avoid strenuous exercise for several days if you have been having frequent leg cramps.  Stretch and massage the cramped muscle for several minutes.  Some medicines may be helpful in some patients with night cramps. Only take over-the-counter or prescription medicines as directed by your caregiver. SEEK IMMEDIATE MEDICAL CARE IF:   Your leg cramps become worse.  Your foot becomes cold, numb, or blue. Document Released: 02/06/2004 Document Revised: 03/23/2011 Document Reviewed: 01/24/2008 [facility] Patient Information 2014 [place], [place].  I have recalled you in some flexeril, that Dr. Armen Pickup had called in prior.  Will will get some lab work today and call you with results.  Please follow up with your PCP next week at your scheduled appointment Remember to drink at least 64 ounces of water a day.

## 2012-09-09 ENCOUNTER — Encounter: Payer: Self-pay | Admitting: Family Medicine

## 2012-09-09 DIAGNOSIS — F1410 Cocaine abuse, uncomplicated: Secondary | ICD-10-CM

## 2012-09-09 HISTORY — DX: Cocaine abuse, uncomplicated: F14.10

## 2012-09-09 LAB — DRUG SCR UR, PAIN MGMT, REFLEX CONF
Barbiturate Quant, Ur: NEGATIVE
Marijuana Metabolite: NEGATIVE
Opiates: NEGATIVE
Phencyclidine (PCP): NEGATIVE
Propoxyphene: NEGATIVE

## 2012-09-12 ENCOUNTER — Encounter: Payer: Self-pay | Admitting: Family Medicine

## 2012-09-19 ENCOUNTER — Encounter: Payer: Self-pay | Admitting: Family Medicine

## 2012-09-19 ENCOUNTER — Ambulatory Visit (INDEPENDENT_AMBULATORY_CARE_PROVIDER_SITE_OTHER): Payer: No Typology Code available for payment source | Admitting: Family Medicine

## 2012-09-19 VITALS — BP 141/93 | HR 98 | Ht 62.5 in | Wt 223.5 lb

## 2012-09-19 DIAGNOSIS — F1410 Cocaine abuse, uncomplicated: Secondary | ICD-10-CM

## 2012-09-19 DIAGNOSIS — R9389 Abnormal findings on diagnostic imaging of other specified body structures: Secondary | ICD-10-CM | POA: Insufficient documentation

## 2012-09-19 DIAGNOSIS — R102 Pelvic and perineal pain: Secondary | ICD-10-CM

## 2012-09-19 NOTE — Assessment & Plan Note (Signed)
A: addressed with patient. counseled patient that use can make pain, cramping, HTN and cough worse. Gave resources to [facility]: Drug abuse.

## 2012-09-19 NOTE — Patient Instructions (Addendum)
Mrs. Frede,  Thank you for coming in today.  Regarding your lower abdominal and pelvic pain: Your uterus does feel enlarged. Please go for scheduled pelvic ultrasound. Continue to drink plenty of fluids.  Please avoid substances that can elevate your blood pressures and cause cramping these include cocaine, cigarettes and caffeine.  If you would like help with avoiding these substances there are Substance Abuse Services at [facility] of the [employer]: [place] Office [phone] Fax [phone]  [facility] Building [address] [place], [place] [address]   F/u with me in 2 weeks to review U/S.   Dr. Armen Pickup

## 2012-09-19 NOTE — Assessment & Plan Note (Signed)
A: suspect pain with uterine  fibroid. Cocaine abuse likely exacerbating pain. No vaginal bleeding.  P: Evaluate further with pelvic and transvaginal U/S.

## 2012-09-19 NOTE — Progress Notes (Signed)
  Subjective:    Patient ID: SHEWANDA SHARPE, female    DOB: 13-Jun-1955, 57 y.o.   MRN: [id number]  HPI 57 yo F presents with her husband  for f/u visit to discuss the following:  1. Lower abdominal cramping:  Persistent sharp cramps occurs 2-3 days per weeks. Last for 1 minutes. Relieved with bending forward. Bilateral lower abdomen. Radiates to anterior thighs. Associated with pulling sensation in the groin. admits to scant vaginal bleeding. LMP 5 years ago. Denies vaginal discharge. Has history of "pelvic mass". No history of pelvic US.   2. Cocaine abuse: patient reports last use at 3-4 months ago. Upset that this was checked. Reports that she snorts cocaine when she uses. Does not feel that this is a problem.   Review of Systems As per HPI     Objective:   Physical Exam BP 141/93  Pulse 98  Ht 5' 2.5" (1.588 m)  Wt 223 lb 8 oz (101.379 kg)  BMI 40.2 kg/m2 General appearance: alert, cooperative and no distress Abdomen: obese, round, soft. mild TTP b/l LQ w/o rebound or guarding. NABS Pelvic: external genitalia normal and vagina normal without discharge Cervix is normal. No blood at os or in vagina.  Negative rectocele or cystocele. Bimanual exam reveals diffusely large uterus w/o single mass.  No CMT.   06/04/10 CT abdomen and pelvis:  The ovaries are normal.  The uterus is not enlarged. However, the body of the uterus is inhomogeneous with poor definition of the endometrial cavity with inhomogeneous enhancement.    Assessment & Plan:

## 2012-09-20 ENCOUNTER — Ambulatory Visit
Admission: RE | Admit: 2012-09-20 | Discharge: 2012-09-20 | Disposition: A | Discharge status: Home / Self Care | Payer: No Typology Code available for payment source | Source: Ambulatory Visit | Attending: Family Medicine | Admitting: Family Medicine

## 2012-09-20 DIAGNOSIS — R102 Pelvic and perineal pain: Secondary | ICD-10-CM

## 2012-09-30 ENCOUNTER — Encounter: Payer: Self-pay | Admitting: Family Medicine

## 2012-09-30 ENCOUNTER — Ambulatory Visit (INDEPENDENT_AMBULATORY_CARE_PROVIDER_SITE_OTHER): Payer: No Typology Code available for payment source | Admitting: Family Medicine

## 2012-09-30 VITALS — BP 140/85 | HR 101 | Ht 62.0 in | Wt 224.0 lb

## 2012-09-30 DIAGNOSIS — R252 Cramp and spasm: Secondary | ICD-10-CM

## 2012-09-30 DIAGNOSIS — R102 Pelvic and perineal pain: Secondary | ICD-10-CM

## 2012-09-30 DIAGNOSIS — Z23 Encounter for immunization: Secondary | ICD-10-CM

## 2012-09-30 DIAGNOSIS — N949 Unspecified condition associated with female genital organs and menstrual cycle: Secondary | ICD-10-CM

## 2012-09-30 MED ORDER — CYCLOBENZAPRINE HCL 10 MG PO TABS: 10.0000 mg | ORAL_TABLET | Freq: Three times a day (TID) | ORAL | Status: DC | PRN

## 2012-09-30 NOTE — Patient Instructions (Signed)
Kristine Atkinson,  Thank you for coming in to see me today. Please schedule and endometrial biopsy with me one morning at your convenience (this is a procedure that will take 30 minutes)   I have refilled your flexeril.  Dr. Armen Pickup

## 2012-09-30 NOTE — Progress Notes (Signed)
  Subjective:    Patient ID: Kristine Atkinson, female    DOB: 1955/05/06, 57 y.o.   MRN: [id number]  HPI 57 yo F with history of fibromyalgia presents for f/u of the following:  1. Pelvic pain: still having sharp pains that radiate to labia. Denies vaginal bleeding. Went for pelvic and transvaginal US. Request flexeril refill. Would like a hysterectomy.   Review of Systems As per HPI     Objective:   Physical Exam BP 140/85  Pulse 101  Ht 5\' 2"  (1.575 m)  Wt 224 lb (101.606 kg)  BMI 40.96 kg/m2 General appearance: alert, cooperative, no distress and morbidly obese Abdomen: obeses, round, non tender  Extremities: edema 1 + LE edema bilaterally.    Pelvic and TVUS reviewed with the patient:  1. At least three measurable fibroids as described above.  2. Minimal prominence of the endometrium. Consider either follow-  up ultrasound in 1-2 months or endometrial biopsy in this  postmenopausal patient.    After discussing the endometrial biopsy vs f/u ultrasound and teh     Assessment & Plan:

## 2012-09-30 NOTE — Assessment & Plan Note (Signed)
A: persistent. No vaginal bleeding. P: f/u endometrial biopsy for thickened endometrium.

## 2012-10-11 ENCOUNTER — Ambulatory Visit: Payer: No Typology Code available for payment source | Admitting: Family Medicine

## 2012-10-24 ENCOUNTER — Encounter: Payer: Self-pay | Admitting: Family Medicine

## 2012-10-24 ENCOUNTER — Ambulatory Visit (INDEPENDENT_AMBULATORY_CARE_PROVIDER_SITE_OTHER): Payer: No Typology Code available for payment source | Admitting: Family Medicine

## 2012-10-24 VITALS — BP 167/101 | HR 99 | Temp 98.3°F | Wt 218.0 lb

## 2012-10-24 DIAGNOSIS — I10 Essential (primary) hypertension: Secondary | ICD-10-CM

## 2012-10-24 DIAGNOSIS — IMO0001 Reserved for inherently not codable concepts without codable children: Secondary | ICD-10-CM

## 2012-10-24 DIAGNOSIS — N949 Unspecified condition associated with female genital organs and menstrual cycle: Secondary | ICD-10-CM

## 2012-10-24 DIAGNOSIS — M797 Fibromyalgia: Secondary | ICD-10-CM

## 2012-10-24 DIAGNOSIS — R102 Pelvic and perineal pain: Secondary | ICD-10-CM

## 2012-10-24 MED ORDER — LISINOPRIL 40 MG PO TABS: 40.0000 mg | ORAL_TABLET | Freq: Every day | ORAL | Status: DC

## 2012-10-24 NOTE — Progress Notes (Deleted)
  Subjective:    Patient ID: Kristine Atkinson, female    DOB: [birth year], 57 y.o.   MRN: [id number]  HPI Patient presented with her husband for endometrial biopsy. The b   Review of Systems     Objective:   Physical Exam        Assessment & Plan:

## 2012-10-24 NOTE — Progress Notes (Signed)
Patient ID: Kristine Atkinson, female   DOB: 1955-05-01, 57 y.o.   MRN: [id number]   Subjective:  56 year old female presents for endometrial biopsy she has a thickened endometrium noted on pelvic ultrasound. She denies vaginal bleeding. She admits to persistent pelvic pain that is described as cramping. She reports that she is no longer using cocaine.  Hypertension: Patient's blood pressure is elevated. She reports compliance with lisinopril. She denies cocaine use.  Chronic pain: Patient presented to not received Lyrica for the past 2 months.  Objective:  Endometrial Biopsy Procedure Note  Pre-operative Diagnosis: normal   Post-operative Diagnosis: normal  Indications: enlarged uterus  Procedure Details   Urine pregnancy test was not done.  The risks (including infection, bleeding, pain, and uterine perforation) and benefits of the procedure were explained to the patient and Written informed consent was obtained.  Antibiotic prophylaxis against endocarditis was not indicated.   The patient was placed in the dorsal lithotomy position.  Bimanual exam showed the uterus to be in the neutral position.  A Graves' speculum inserted in the vagina, and the cervix prepped with povidone iodine.     A sharp tenaculum was applied to the posterior lip of the cervix for stabilization.  A sterile uterine sound was used to sound the uterus to a depth of 4cm. The curette  was inserted but would not pass 4 cm. The procedure was aborted due to inability to pass the curettento the uterine cavity.  Condition: Stable  Complications: None  Plan:  The patient was advised to call for any fever or for prolonged or severe pain or bleeding. She was advised to use NSAID as needed for mild to moderate pain. She was advised to avoid vaginal intercourse for 48 hours or until the bleeding has completely stopped.

## 2012-10-24 NOTE — Patient Instructions (Addendum)
Kristine Atkinson,  Thank you for coming in today. I am sorry that procedure was so painful for you. We were not able to get the sample of your endometrium.   I have placed a referral to gynecology so you can discuss your pelvic pain.   HTN: BP has been elevated lately. If you are not using cocaine then it is elevated on its own and needs to be treated. Increase lisinopril to 40 mg (4, 10 mg tablets) once daily.   Please follow up with me in two weeks for re-evaluate your blood pressure.   Dr. Armen Pickup

## 2012-10-25 ENCOUNTER — Other Ambulatory Visit: Payer: Self-pay | Admitting: Family Medicine

## 2012-10-25 DIAGNOSIS — Z1231 Encounter for screening mammogram for malignant neoplasm of breast: Secondary | ICD-10-CM

## 2012-10-25 MED ORDER — PREGABALIN 225 MG PO CAPS: 225.0000 mg | ORAL_CAPSULE | Freq: Two times a day (BID) | ORAL | Status: DC

## 2012-10-25 NOTE — Assessment & Plan Note (Signed)
A: unsuccessful biopsy for thickened endometrium and pelvic pain.  P: Referral to gyn to reattempt endometrial  biopsy vs wait and repeat U/S.  Also to address fibroid.

## 2012-10-25 NOTE — Assessment & Plan Note (Signed)
A: declined P: Increased lisinopril to 40 mg daily. Pressure if the patient is still using cocaine or not. She reports that she is not.

## 2012-10-25 NOTE — Assessment & Plan Note (Signed)
Will work on getting patient's Lyrica to her. [facility]

## 2012-10-26 ENCOUNTER — Telehealth: Payer: Self-pay | Admitting: Family Medicine

## 2012-10-26 NOTE — Telephone Encounter (Signed)
Pt is advised of [facility]

## 2012-10-26 NOTE — Telephone Encounter (Signed)
Message copied by Barnie Alderman on Wed Oct 26, 2012 11:26 AM ------      Message from: Dessa Phi      Created: Tue Oct 25, 2012  1:40 PM       Please call patient later noticed a new Ly[facility]. ------

## 2012-10-31 ENCOUNTER — Other Ambulatory Visit: Payer: Self-pay | Admitting: Family Medicine

## 2012-10-31 DIAGNOSIS — I10 Essential (primary) hypertension: Secondary | ICD-10-CM

## 2012-10-31 MED ORDER — LISINOPRIL 40 MG PO TABS: 40.0000 mg | ORAL_TABLET | Freq: Every day | ORAL | Status: DC

## 2012-11-07 ENCOUNTER — Encounter: Payer: Self-pay | Admitting: Family Medicine

## 2012-11-07 ENCOUNTER — Ambulatory Visit (INDEPENDENT_AMBULATORY_CARE_PROVIDER_SITE_OTHER): Payer: No Typology Code available for payment source | Admitting: Family Medicine

## 2012-11-07 VITALS — BP 154/96 | HR 99 | Temp 98.9°F | Ht 62.0 in | Wt 215.2 lb

## 2012-11-07 DIAGNOSIS — R252 Cramp and spasm: Secondary | ICD-10-CM

## 2012-11-07 DIAGNOSIS — I10 Essential (primary) hypertension: Secondary | ICD-10-CM

## 2012-11-07 DIAGNOSIS — E119 Type 2 diabetes mellitus without complications: Secondary | ICD-10-CM

## 2012-11-07 DIAGNOSIS — E785 Hyperlipidemia, unspecified: Secondary | ICD-10-CM

## 2012-11-07 LAB — LIPID PANEL
HDL: 41 mg/dL (ref >39)
LDL Cholesterol: 125 mg/dL — ABNORMAL HIGH (ref 0–99)
Total CHOL/HDL Ratio: 4.8 Ratio
Triglycerides: 149 mg/dL (ref <150)
VLDL: 30 mg/dL (ref 0–40)

## 2012-11-07 LAB — BASIC METABOLIC PANEL
CO2: 31 mEq/L (ref 19–32)
Chloride: 102 mEq/L (ref 96–112)
Creat: 1 mg/dL (ref 0.50–1.10)
Glucose, Bld: 124 mg/dL — ABNORMAL HIGH (ref 70–99)
Sodium: 138 mEq/L (ref 135–145)

## 2012-11-07 MED ORDER — CYCLOBENZAPRINE HCL 10 MG PO TABS: 10.0000 mg | ORAL_TABLET | Freq: Three times a day (TID) | ORAL | Status: DC | PRN

## 2012-11-07 MED ORDER — OMEPRAZOLE 20 MG PO CPDR: 40.0000 mg | DELAYED_RELEASE_CAPSULE | Freq: Every day | ORAL | Status: DC

## 2012-11-07 MED ORDER — LISINOPRIL 40 MG PO TABS: 40.0000 mg | ORAL_TABLET | Freq: Every day | ORAL | Status: DC

## 2012-11-07 MED ORDER — METFORMIN HCL 500 MG PO TABS: ORAL_TABLET | ORAL | Status: DC

## 2012-11-07 MED ORDER — AMITRIPTYLINE HCL 75 MG PO TABS: 75.0000 mg | ORAL_TABLET | Freq: Every day | ORAL | Status: DC

## 2012-11-07 MED ORDER — METFORMIN HCL 850 MG PO TABS: 850.0000 mg | ORAL_TABLET | Freq: Two times a day (BID) | ORAL | Status: DC

## 2012-11-07 NOTE — Assessment & Plan Note (Signed)
Not on statin therapy. Should be given diabetes.  Lipid panel obtained today.

## 2012-11-07 NOTE — Progress Notes (Signed)
  Subjective:    Patient ID: Kristine Atkinson, female    DOB: 07-Jul-1955, 57 y.o.   MRN: [id number]  HPI 57 yo F presents for follow visit discuss the following:  #1 hypertension: Patient blood pressure medicine for the past 2 weeks. She denies chest pain, headache, shortness of breath, peripheral edema. She admits to persistent cramping in her pelvic area and lower extremity. She reports that she's decreased her smoking.  #2 diabetes type II: Patient is compliant with metformin. She denies GI upset, polyuria polydipsia. She does not check her blood sugars.  #3 pelvic pain: Patient asked about GYN referral. She has not yet heard which he can schedule her appointment.  Review of Systems As per history of present illness    Objective:   Physical Exam BP 154/96  Pulse 99  Temp(Src) 98.9 F (37.2 C) (Oral)  Ht 5\' 2"  (1.575 m)  Wt 215 lb 3.2 oz (97.614 kg)  BMI 39.35 kg/m2 General appearance: alert, cooperative and no distress Neck: no adenopathy, no carotid bruit, no JVD, supple, symmetrical, trachea midline and thyroid not enlarged, symmetric, no tenderness/mass/nodules Lungs: clear to auscultation bilaterally Heart: regular rate and rhythm, S1, S2 normal, no murmur, click, rub or gallop Extremities: extremities normal, atraumatic, no cyanosis or edema  Diabetic foot exam done: feet are dry with thick callus on medial first digits, otherwise normal.      Assessment & Plan:

## 2012-11-07 NOTE — Assessment & Plan Note (Addendum)
A: improved from last OV but still elevated above goal Meds: none x 2 weeks P: Lisinopril 40 mg daily F/u BP in 3 weeks  BMP today

## 2012-11-07 NOTE — Assessment & Plan Note (Addendum)
A: slight increase in A1c  Meds: compliant P: Increase metformin to 850 mg PO BID  Start statin therapy crestor 20 mg daily

## 2012-11-07 NOTE — Patient Instructions (Addendum)
Kristine Atkinson,  Thank you for coming in today.  Please [facility] your [facility] and start taking it today.  Your A1c is at 7. This is still well controlled, but up from last time. Please continue metformin twice daily now 850 mg. You lost 3 lbs! Great job!! Keep up the good work.   I have sent all medications to the appropriate pharmacy. You have a year supply on elavil, metformin, lisinopril and omeprazole.   Follow up with me in one month.  Dr. Armen Pickup

## 2012-11-08 ENCOUNTER — Encounter: Payer: Self-pay | Admitting: Family Medicine

## 2012-11-08 ENCOUNTER — Telehealth: Payer: Self-pay | Admitting: Family Medicine

## 2012-11-08 DIAGNOSIS — E119 Type 2 diabetes mellitus without complications: Secondary | ICD-10-CM

## 2012-11-08 MED ORDER — ROSUVASTATIN CALCIUM 20 MG PO TABS: 20.0000 mg | ORAL_TABLET | Freq: Every day | ORAL | Status: DC

## 2012-11-08 NOTE — Telephone Encounter (Signed)
Will forward to [employer] ([employer] education officer) for info on patients referral. Fleeger, Maryjo Rochester

## 2012-11-08 NOTE — Addendum Note (Signed)
Addended by: Dessa Phi on: 11/08/2012 12:34 PM   Modules accepted: Orders

## 2012-11-08 NOTE — Telephone Encounter (Signed)
Called patient.  Reviewed labs.   Patient has diabetes and should be on a high intensity statin. Rx sent to [facility] at patient's request.   Patient asking about gyn referral. Has not heard anything yet. Will route to my team to give patient and update.

## 2012-11-10 NOTE — Telephone Encounter (Signed)
[facility] informing pt that "referral has been processed, but they do not have any available slots at this time, they will call when they do". Fleeger, Maryjo Rochester

## 2012-11-10 NOTE — Telephone Encounter (Signed)
Referral process on 10/13 [facility]  They do not have any appt available as soon appt are available they will contact pt and send a letter.   [place]

## 2012-11-14 ENCOUNTER — Other Ambulatory Visit: Payer: Self-pay | Admitting: Family Medicine

## 2012-11-15 ENCOUNTER — Ambulatory Visit (HOSPITAL_COMMUNITY)
Admission: RE | Admit: 2012-11-15 | Discharge: 2012-11-15 | Disposition: A | Discharge status: Home / Self Care | Payer: No Typology Code available for payment source | Source: Ambulatory Visit | Attending: Family Medicine | Admitting: Family Medicine

## 2012-11-15 DIAGNOSIS — Z1231 Encounter for screening mammogram for malignant neoplasm of breast: Secondary | ICD-10-CM | POA: Insufficient documentation

## 2012-11-28 ENCOUNTER — Telehealth: Payer: Self-pay | Admitting: Family Medicine

## 2012-11-28 ENCOUNTER — Encounter: Payer: Self-pay | Admitting: Family Medicine

## 2012-11-28 ENCOUNTER — Ambulatory Visit (INDEPENDENT_AMBULATORY_CARE_PROVIDER_SITE_OTHER): Payer: No Typology Code available for payment source | Admitting: Family Medicine

## 2012-11-28 VITALS — BP 155/84 | HR 95 | Temp 98.1°F | Ht 62.0 in | Wt 224.8 lb

## 2012-11-28 DIAGNOSIS — M797 Fibromyalgia: Secondary | ICD-10-CM

## 2012-11-28 DIAGNOSIS — I10 Essential (primary) hypertension: Secondary | ICD-10-CM

## 2012-11-28 DIAGNOSIS — R9389 Abnormal findings on diagnostic imaging of other specified body structures: Secondary | ICD-10-CM

## 2012-11-28 DIAGNOSIS — IMO0001 Reserved for inherently not codable concepts without codable children: Secondary | ICD-10-CM

## 2012-11-28 MED ORDER — PREGABALIN 225 MG PO CAPS: 225.0000 mg | ORAL_CAPSULE | Freq: Two times a day (BID) | ORAL | Status: DC

## 2012-11-28 NOTE — Telephone Encounter (Signed)
Pt informed. Fleeger, Jessica Dawn  

## 2012-11-28 NOTE — Telephone Encounter (Addendum)
Please call patient:  Lyrica paperwork reviewed. Called [facility] [phone]) to refill patient's lyrica.  Medicine change request form received, filled out and faxed to [facility] with a new Rx, lyrica 225 mg po BID, 90 day supply with one refill.  Per the form, lyrica will be shipped to the [facility] and dispensed by me to the patient.  Dr. Alcide Goodness will call with medication is available from pick up from [facility].  Patient may pickup the paperwork that was dropped off out front

## 2012-11-28 NOTE — Telephone Encounter (Signed)
Pt dropped off paperwork to be filled out concerning [employer].

## 2012-11-28 NOTE — Patient Instructions (Signed)
Thank you for coming in today. Please drop off the paperwork for [facility] and I will get the prescription for your lyrica sent off.   My referral coordinator will be in touch via phone and letter regarding your gynecology referral.  F/u with me in 2 months. Call and come in sooner if you see blood in stool again.    Dr. Armen Pickup

## 2012-11-28 NOTE — Assessment & Plan Note (Addendum)
Still awaiting gyn referral for eval. Failed attempt at endometrial biopsy in the office.

## 2012-11-28 NOTE — Assessment & Plan Note (Signed)
Needs new rx for lyrica. Getting lyrica through [facility]" will drop off paperwork.

## 2012-11-28 NOTE — Progress Notes (Signed)
  Subjective:    Patient ID: Kristine Atkinson, female    DOB: 06-Jul-1955, 57 y.o.   MRN: [id number]  HPI 57 yo F presents for f/u visit:  1. HTN: compliant with medications. No CP, SOB or LE edema.   2. Pelvic pain: sharp, lightening pains from groin to rectum. Taking lyrica. No vaginal bleeding. Scant blood per rectum x one yesterday. Negative colonoscopy reviewed from 12/04/11, repeat in 5 years recommended.   Soc hx reviewed: Patient is a smoker.   Review of Systems As per HPI     Objective:   Physical Exam BP 155/84  Pulse 95  Temp(Src) 98.1 F (36.7 C) (Oral)  Ht 5\' 2"  (1.575 m)  Wt 224 lb 12.8 oz (101.969 kg)  BMI 41.11 kg/m2 General appearance: alert, cooperative and no distress Lungs: clear to auscultation bilaterally Heart: regular rate and rhythm, S1, S2 normal, no murmur, click, rub or gallop Extremities: extremities normal, atraumatic, no cyanosis or edema     Assessment & Plan:

## 2012-11-28 NOTE — Assessment & Plan Note (Signed)
A: improved still above goal of < 150/90 Meds: compliant P: continue current regimen  F/u in 2 months

## 2012-12-20 ENCOUNTER — Other Ambulatory Visit: Payer: Self-pay | Admitting: Family Medicine

## 2012-12-20 DIAGNOSIS — M797 Fibromyalgia: Secondary | ICD-10-CM

## 2012-12-20 MED ORDER — PREGABALIN 225 MG PO CAPS: 225.0000 mg | ORAL_CAPSULE | Freq: Two times a day (BID) | ORAL | Status: DC

## 2012-12-26 ENCOUNTER — Other Ambulatory Visit (HOSPITAL_COMMUNITY)
Admission: RE | Admit: 2012-12-26 | Discharge: 2012-12-26 | Disposition: A | Discharge status: Home / Self Care | Payer: No Typology Code available for payment source | Source: Ambulatory Visit | Attending: Obstetrics & Gynecology | Admitting: Obstetrics & Gynecology

## 2012-12-26 ENCOUNTER — Ambulatory Visit (INDEPENDENT_AMBULATORY_CARE_PROVIDER_SITE_OTHER): Payer: No Typology Code available for payment source | Admitting: Obstetrics & Gynecology

## 2012-12-26 ENCOUNTER — Encounter: Payer: Self-pay | Admitting: *Deleted

## 2012-12-26 ENCOUNTER — Encounter: Payer: Self-pay | Admitting: Obstetrics & Gynecology

## 2012-12-26 VITALS — BP 175/110 | HR 106 | Temp 97.0°F | Ht 62.0 in | Wt 211.0 lb

## 2012-12-26 DIAGNOSIS — N949 Unspecified condition associated with female genital organs and menstrual cycle: Secondary | ICD-10-CM | POA: Insufficient documentation

## 2012-12-26 DIAGNOSIS — N950 Postmenopausal bleeding: Secondary | ICD-10-CM

## 2012-12-26 NOTE — Patient Instructions (Signed)
Postmenopausal Bleeding Menopause is commonly referred to as the "change in life." It is a time when the fertile years, the time of ovulating and having menstrual periods, has come to an end. It is also determined by not having menstrual periods for 12 months.  Postmenopausal bleeding is any bleeding a woman has after she has entered into menopause. Any type of postmenopausal bleeding, even if it appears to be a typical menstrual period, is concerning. This should be evaluated by your caregiver.  CAUSES   Hormone therapy.  Cancer of the cervix or cancer of the lining of the uterus (endometrial cancer).  Thinning of the uterine lining (uterine atrophy).  Thyroid diseases.  Certain medicines.  Infection of the uterus or cervix.  Inflammation or irritation of the uterine lining (endometritis).  Estrogen-secreting tumors.  Growths (polyps) on the cervix, uterine lining, or uterus.  Uterine tumors (fibroids).  Being very overweight (obese). DIAGNOSIS  Your caregiver will take a medical history and ask questions. A physical exam will also be performed. Further tests may include:   A transvaginal ultrasound. An ultrasound wand or probe is inserted into your vagina to view the pelvic organs.  A biopsy of the lining of the uterus (endometrium). A sample of the endometrium is removed and examined.  A hysteroscopy. Your caregiver may use an instrument with a light and a camera attached to it (hysteroscope). The hysteroscope is used to look inside the uterus for problems.  A dilation and curettage (D&C). Tissue is removed from the uterine lining to be examined for problems. TREATMENT  Treatment depends on the cause of the bleeding. Some treatments include:   Surgery.  Medicines.  Hormones.  A hysteroscopy or D&C to remove polyps or fibroids.  Changing or stopping a current medicine you are taking. Talk to your caregiver about your specific treatment. HOME CARE INSTRUCTIONS    Maintain a healthy weight.  Keep regular pelvic exams and Pap tests. SEEK MEDICAL CARE IF:   You have bleeding, even if it is light in comparison to your previous periods.  Your bleeding lasts more than 1 week.  You have abdominal pain.  You develop bleeding with sexual intercourse. SEEK IMMEDIATE MEDICAL CARE IF:   You have a fever, chills, headache, dizziness, muscle aches, and bleeding.  You have severe pain with bleeding.  You are passing blood clots.  You have bleeding and need more than 1 pad an hour.  You feel faint. MAKE SURE YOU:  Understand these instructions.  Will watch your condition.  Will get help right away if you are not doing well or get worse. Document Released: 04/08/2005 Document Revised: 03/23/2011 Document Reviewed: 07/28/2012 [employer] Patient Information 2014 [employer].  

## 2012-12-26 NOTE — Progress Notes (Signed)
Patient ID: Kristine Atkinson, female   DOB: [birth year], 57 y.o.   MRN: [id number]  Chief Complaint  Patient presents with  . Referral    from [facility]  . Pelvic Pain    for years  . Metrorrhagia    sometimes bleeds twice a month     HPI Kristine Atkinson is a 57 y.o. female.  [id number] No LMP recorded. Few weeks of vaginal spotting and cramps. Post menopausal 5 years.  HPI  Past Medical History  Diagnosis Date  . Chronic pain   . Diabetes mellitus   . Hypertension   . Fibromyalgia   . Cocaine abuse 09/09/2012    Cocaine positive on UDS 09/09/2012.      Past Surgical History  Procedure Laterality Date  . Oophorectomy      ectopic    Family History  Problem Relation Age of Onset  . Cancer Maternal Aunt     colon cancer  . Hypertension Mother     Social History History  Substance Use Topics  . Smoking status: Current Every Day Smoker -- 0.50 packs/day  . Smokeless tobacco: Never Used     Comment: as of 05/13/11 about 3 cig a day  . Alcohol Use: No    Allergies  Allergen Reactions  . Penicillins     Current Outpatient Prescriptions  Medication Sig Dispense Refill  . amitriptyline (ELAVIL) 75 MG tablet Take 1 tablet (75 mg total) by mouth at bedtime.  90 tablet  3  . cyclobenzaprine (FLEXERIL) 10 MG tablet Take 1 tablet (10 mg total) by mouth 3 (three) times daily as needed for muscle spasms.  60 tablet  3  . lisinopril (PRINIVIL,ZESTRIL) 40 MG tablet Take 1 tablet (40 mg total) by mouth daily.  90 tablet  3  . metFORMIN (GLUCOPHAGE) 850 MG tablet Take 1 tablet (850 mg total) by mouth 2 (two) times daily with a meal.  60 tablet  2  . omeprazole (PRILOSEC) 20 MG capsule Take 2 capsules (40 mg total) by mouth daily.  180 capsule  3  . pregabalin (LYRICA) 225 MG capsule Take 1 capsule (225 mg total) by mouth 2 (two) times daily.  180 capsule  3  . rosuvastatin (CRESTOR) 20 MG tablet Take 1 tablet (20 mg total) by mouth daily.  90 tablet  3   No current  facility-administered medications for this visit.    Review of Systems Review of Systems  Constitutional: Negative.   Gastrointestinal: Positive for nausea, vomiting, abdominal pain and abdominal distention. Negative for blood in stool.  Genitourinary: Positive for vaginal bleeding, menstrual problem and pelvic pain. Negative for vaginal discharge.    Blood pressure 175/110, pulse 106, temperature 97 F (36.1 C), temperature source Oral, height 5\' 2"  (1.575 m), weight 211 lb (95.709 kg).  Physical Exam Physical Exam  Constitutional: She is oriented to person, place, and time. She appears distressed (agitated).  Genitourinary: Vagina normal and uterus normal. No vaginal discharge found.  Neurological: She is alert and oriented to person, place, and time.  Skin: Skin is warm and dry.    Data Reviewed *RADIOLOGY REPORT*  Clinical Data: Pelvic pain, enlarged uterus on physical exam, no  hormone replacement therapy  TRANSABDOMINAL AND TRANSVAGINAL ULTRASOUND OF PELVIS  Technique: Both transabdominal and transvaginal ultrasound  examinations of the pelvis were performed including evaluation of  the uterus, ovaries, adnexal regions, and pelvic cul-de-sac.  Comparison: CT abdomen pelvis of 06/04/2010  Findings:  Uterus: The uterus is  lobular in contour measuring 9.1 x 5.2 x 5.3  cm. Three measurable fibroids are present. One fibroid is  anteriorly positioned measuring 2.6 x 1.8 x 1.9 cm. Another  fibroid is posteriorly positioned measuring 1.9 x 2.0 x 1.8 cm.  The third fibroid emanates from the fundus measuring 1.6 x 1.5 x  1.1 cm.  Endometrium:The endometrium measures 6.6 mm in this post menopausal  patient which is only slightly prominent, with normal being 5 mm or  less post menopausally. Either follow-up ultrasound or endometrial  biopsy may be warranted.  Right Ovary :The right ovary is not visualized due to bowel gas.  Left Ovary :The left ovary is not visualized.  Other  Findings: No free fluid is seen.  IMPRESSION:  1. At least three measurable fibroids as described above.  2. Minimal prominence of the endometrium. Consider either follow-  up ultrasound in 1-2 months or endometrial biopsy in this  postmenopausal patient.  Original Report Authenticated By: Dwyane Dee, M.D.  Patient given informed consent, signed copy in the chart, time out was performed. Appropriate time out taken. . The patient was placed in the lithotomy position and the cervix brought into view with sterile speculum.  Portio of cervix cleansed x 2 with betadine swabs.  A tenaculum was placed in the anterior lip of the cervix.  The uterus was sounded for depth of 7 cm. A pipelle was introduced to into the uterus, suction created,  and an endometrial sample was obtained, minimal tissue. All equipment was removed and accounted for.  The patient tolerated the procedure well.    Patient given post procedure instructions. The patient will return in 2 weeks for results.  Assessment    Postmenopausal bleeding     Plan    Endometrial biopsy done, RTC for result.         Schaller,Elisa Sorlie 12/26/2012, 2:42 PM

## 2012-12-27 ENCOUNTER — Ambulatory Visit: Payer: No Typology Code available for payment source | Admitting: Family Medicine

## 2013-01-19 ENCOUNTER — Encounter: Payer: Self-pay | Admitting: Obstetrics & Gynecology

## 2013-01-19 ENCOUNTER — Ambulatory Visit (INDEPENDENT_AMBULATORY_CARE_PROVIDER_SITE_OTHER): Payer: No Typology Code available for payment source | Admitting: Obstetrics & Gynecology

## 2013-01-19 VITALS — BP 160/109 | HR 106 | Temp 97.8°F | Ht 62.0 in | Wt 212.7 lb

## 2013-01-19 DIAGNOSIS — R9389 Abnormal findings on diagnostic imaging of other specified body structures: Secondary | ICD-10-CM

## 2013-01-19 NOTE — Progress Notes (Signed)
Patient ID: Kristine Atkinson, female   DOB: [birth year], 58 y.o.   MRN: [id number] No vaginal bleeding now after Bx which was benign. Reassured, will f/u with Dr. Adrian Blackwater and RTC prn  [name], MD 01/19/2013

## 2013-01-19 NOTE — Patient Instructions (Signed)
Postmenopausal Bleeding Menopause is commonly referred to as the "change in life." It is a time when the fertile years, the time of ovulating and having menstrual periods, has come to an end. It is also determined by not having menstrual periods for 12 months.  Postmenopausal bleeding is any bleeding a woman has after she has entered into menopause. Any type of postmenopausal bleeding, even if it appears to be a typical menstrual period, is concerning. This should be evaluated by your caregiver.  CAUSES   Hormone therapy.  Cancer of the cervix or cancer of the lining of the uterus (endometrial cancer).  Thinning of the uterine lining (uterine atrophy).  Thyroid diseases.  Certain medicines.  Infection of the uterus or cervix.  Inflammation or irritation of the uterine lining (endometritis).  Estrogen-secreting tumors.  Growths (polyps) on the cervix, uterine lining, or uterus.  Uterine tumors (fibroids).  Being very overweight (obese). DIAGNOSIS  Your caregiver will take a medical history and ask questions. A physical exam will also be performed. Further tests may include:   A transvaginal ultrasound. An ultrasound wand or probe is inserted into your vagina to view the pelvic organs.  A biopsy of the lining of the uterus (endometrium). A sample of the endometrium is removed and examined.  A hysteroscopy. Your caregiver may use an instrument with a light and a camera attached to it (hysteroscope). The hysteroscope is used to look inside the uterus for problems.  A dilation and curettage (D&C). Tissue is removed from the uterine lining to be examined for problems. TREATMENT  Treatment depends on the cause of the bleeding. Some treatments include:   Surgery.  Medicines.  Hormones.  A hysteroscopy or D&C to remove polyps or fibroids.  Changing or stopping a current medicine you are taking. Talk to your caregiver about your specific treatment. HOME CARE INSTRUCTIONS    Maintain a healthy weight.  Keep regular pelvic exams and Pap tests. SEEK MEDICAL CARE IF:   You have bleeding, even if it is light in comparison to your previous periods.  Your bleeding lasts more than 1 week.  You have abdominal pain.  You develop bleeding with sexual intercourse. SEEK IMMEDIATE MEDICAL CARE IF:   You have a fever, chills, headache, dizziness, muscle aches, and bleeding.  You have severe pain with bleeding.  You are passing blood clots.  You have bleeding and need more than 1 pad an hour.  You feel faint. MAKE SURE YOU:  Understand these instructions.  Will watch your condition.  Will get help right away if you are not doing well or get worse. Document Released: 04/08/2005 Document Revised: 03/23/2011 Document Reviewed: 07/28/2012 [facility] Patient Information 2014 [place], [place].

## 2013-02-22 ENCOUNTER — Telehealth: Payer: Self-pay | Admitting: Family Medicine

## 2013-02-22 NOTE — Telephone Encounter (Signed)
Placed in MDs box. Fleeger, Jessica Dawn  

## 2013-02-22 NOTE — Telephone Encounter (Signed)
Pt brought in form to be completed and faxed to [facility] regarding her diabetes. She would like to have the form in drs office by first thing Monday morning Please call pt when the form has been faxed

## 2013-02-24 NOTE — Telephone Encounter (Signed)
Pt called regarding forms have been faxed to number provided and placed up front with clerical for pick up. Derl Barrow, RN

## 2013-02-27 ENCOUNTER — Telehealth: Payer: Self-pay | Admitting: Family Medicine

## 2013-02-27 NOTE — Telephone Encounter (Signed)
Patient stopped by and picked up paperwork that was filled out for [employer].  She came back in and said that you had ruined her life by what was written on the form.  She was very upset and wanted a message sent to let you know.  She wanted you to call her but her phone is out of minutes and won't have any added until some time next week.

## 2013-02-27 NOTE — Telephone Encounter (Signed)
I will call her next week. The only thing on her forms was pertinent and truthful medical information which she has signed a release with [employer]  allowing me to include.

## 2013-03-10 NOTE — Telephone Encounter (Signed)
Called patient back. Left VM. Apologized for offending her. I did not mean to upset her. I am required to include pertinent medical information on forms, and therefore could not leave off the history of cocaine abuse.  Asked patient to call back to the clinic if she would like to discuss her concerns further with me.

## 2013-03-20 ENCOUNTER — Telehealth: Payer: Self-pay | Admitting: Family Medicine

## 2013-03-20 ENCOUNTER — Encounter: Payer: Self-pay | Admitting: Family Medicine

## 2013-03-20 ENCOUNTER — Ambulatory Visit (INDEPENDENT_AMBULATORY_CARE_PROVIDER_SITE_OTHER): Payer: No Typology Code available for payment source | Admitting: Family Medicine

## 2013-03-20 VITALS — BP 130/83 | HR 89 | Temp 98.1°F | Wt 212.0 lb

## 2013-03-20 DIAGNOSIS — E119 Type 2 diabetes mellitus without complications: Secondary | ICD-10-CM

## 2013-03-20 DIAGNOSIS — M797 Fibromyalgia: Secondary | ICD-10-CM

## 2013-03-20 DIAGNOSIS — F1410 Cocaine abuse, uncomplicated: Secondary | ICD-10-CM

## 2013-03-20 DIAGNOSIS — IMO0001 Reserved for inherently not codable concepts without codable children: Secondary | ICD-10-CM

## 2013-03-20 DIAGNOSIS — I10 Essential (primary) hypertension: Secondary | ICD-10-CM

## 2013-03-20 DIAGNOSIS — R252 Cramp and spasm: Secondary | ICD-10-CM

## 2013-03-20 LAB — POCT GLYCOSYLATED HEMOGLOBIN (HGB A1C): Hemoglobin A1C: 6.3

## 2013-03-20 MED ORDER — LISINOPRIL 40 MG PO TABS: 40.0000 mg | ORAL_TABLET | Freq: Every day | ORAL | Status: DC

## 2013-03-20 MED ORDER — ROSUVASTATIN CALCIUM 20 MG PO TABS: 20.0000 mg | ORAL_TABLET | Freq: Every day | ORAL | Status: DC

## 2013-03-20 MED ORDER — AZITHROMYCIN 250 MG PO TABS: 500.0000 mg | ORAL_TABLET | Freq: Once | ORAL | Status: AC

## 2013-03-20 MED ORDER — CYCLOBENZAPRINE HCL 10 MG PO TABS: 10.0000 mg | ORAL_TABLET | Freq: Three times a day (TID) | ORAL | Status: DC | PRN

## 2013-03-20 MED ORDER — METFORMIN HCL 850 MG PO TABS: 850.0000 mg | ORAL_TABLET | Freq: Two times a day (BID) | ORAL | Status: DC

## 2013-03-20 MED ORDER — AZITHROMYCIN 500 MG PO TABS: 500.0000 mg | ORAL_TABLET | Freq: Every day | ORAL | Status: DC

## 2013-03-20 NOTE — Telephone Encounter (Signed)
Called patient. Refilled lyrica called into [facility] order [id number], 7-10 days to receive order from 03/27/2013

## 2013-03-20 NOTE — Assessment & Plan Note (Signed)
Patient denies use. Repeat UDS ordered to verify.

## 2013-03-20 NOTE — Progress Notes (Signed)
   Subjective:    Patient ID: Kristine Atkinson, female    DOB: May 18, 1955, 58 y.o.   MRN: [id number]  HPI 1. CHRONIC DIABETES  Disease Monitoring  Blood Sugar Ranges: well controlled no exact numbers   Polyuria: no   Visual problems: no   Medication Compliance: yes  Medication Side Effects  Hypoglycemia: no   Preventitive Health Care  Eye Exam: due 5/15  Foot Exam: due 10/15   Diet pattern: good. Low fat diet.   Exercise: walking    2. Cough: x one week. Taking azithromycin one tab daily for past 3 days. No fever, chills, chest pain or SOB. Non known sick contacts. Smokes 3 cigs per day.  3. Upset with cocaine abuse being in records: would like to take a repeat UDS. Reports she is no longer abusing cocaine.    4. Fibromyalgia: no cramps. Walking more. Not taking elavil x 3 months. Taking flexeril as needed which controls symptoms.   Soc Hx: patient is a smoker. Denies recurrent cocaine abuse.  Review of Systems As per HPI     Objective:   Physical Exam BP 130/83  Pulse 89  Temp(Src) 98.1 F (36.7 C) (Oral)  Wt 212 lb (96.163 kg)  SpO2 95% Wt Readings from Last 3 Encounters:  03/20/13 212 lb (96.163 kg)  01/19/13 212 lb 11.2 oz (96.48 kg)  12/26/12 211 lb (95.709 kg)  General appearance: alert, cooperative and no distress Nose: mucoid discharge, turbinates pink, swollen Throat: lips, mucosa, and tongue normal; teeth and gums normal Lungs: clear to auscultation bilaterally Heart: regular rate and rhythm, S1, S2 normal, no murmur, click, rub or gallop  Lab Results  Component Value Date   HGBA1C 6.3 03/20/2013      Assessment & Plan:

## 2013-03-20 NOTE — Assessment & Plan Note (Addendum)
A: well controlled. Meds: complaint P: Refilled metformin No compliant with crestor recommend compliance to decrease risk for CVA and MI F/u in 6 months since well controlled.

## 2013-03-20 NOTE — Patient Instructions (Addendum)
Mrs. Kristine Atkinson,  Thank you for coming in today. Please continue current medication regimen. I have [facility]ed refills [facility] lisinopril [facility]ith[facility]. Metformin to [facility], Flexeril to [facility].  crestor to [facility].   You are doing well from a chronic disease standpoint, so you can f/u in 6 months   Dr. Adrian Blackwater

## 2013-03-20 NOTE — Assessment & Plan Note (Addendum)
A: improved. No longer taking elavil. Compliant with lyrica and flexeril.  P: Refill flexeril  Refill lyrica called into [facility] order #[id number], 7-10 days to receive order from 03/17/2013 Weight loss Exercise

## 2013-05-23 ENCOUNTER — Ambulatory Visit: Payer: No Typology Code available for payment source | Admitting: Family Medicine

## 2013-05-29 ENCOUNTER — Ambulatory Visit (INDEPENDENT_AMBULATORY_CARE_PROVIDER_SITE_OTHER): Payer: No Typology Code available for payment source | Admitting: Family Medicine

## 2013-05-29 ENCOUNTER — Encounter: Payer: Self-pay | Admitting: Family Medicine

## 2013-05-29 VITALS — BP 157/84 | HR 88 | Temp 98.2°F | Wt 208.0 lb

## 2013-05-29 DIAGNOSIS — E669 Obesity, unspecified: Secondary | ICD-10-CM | POA: Insufficient documentation

## 2013-05-29 DIAGNOSIS — F1410 Cocaine abuse, uncomplicated: Secondary | ICD-10-CM

## 2013-05-29 DIAGNOSIS — M797 Fibromyalgia: Secondary | ICD-10-CM

## 2013-05-29 DIAGNOSIS — IMO0001 Reserved for inherently not codable concepts without codable children: Secondary | ICD-10-CM

## 2013-05-29 DIAGNOSIS — I10 Essential (primary) hypertension: Secondary | ICD-10-CM

## 2013-05-29 MED ORDER — HYDROCHLOROTHIAZIDE 25 MG PO TABS: 12.5000 mg | ORAL_TABLET | Freq: Every day | ORAL | Status: DC

## 2013-05-29 MED ORDER — PREGABALIN 225 MG PO CAPS: 225.0000 mg | ORAL_CAPSULE | Freq: Two times a day (BID) | ORAL | Status: DC

## 2013-05-29 NOTE — Patient Instructions (Addendum)
Mrs and Mr Kyer,  Thank you for coming in today.  1. High blood pressure: continue lisinopril 40 mg daily. Add HCTZ 12.5 mg (half tablet) daily. F/u BP check in 4-6 weeks.   2. Lyrica refills- please take prescription to [facility] as soon as possible. They can takeover the process of getting the lyric from [employer]. They do this frequently. Getting from the [facility] program will prevent gaps in supply  and keeping you from going without the medication.   3. Previous substance abuse: I am happy to hear that you are involved with [facility]. It is an awesome program. Please call ahead and return to clinic at your earliest convenience for follow up urine drug screen.   Dr. Adrian Blackwater

## 2013-05-29 NOTE — Assessment & Plan Note (Signed)
A: BP above goal. Meds: complaint P: add HCTZ 12.5 mg to lisinopril 40 mg daily.  F/u in 4-6 weeks for repeat BP check, CMP in 8-12 weeks

## 2013-05-29 NOTE — Progress Notes (Signed)
   Subjective:    Patient ID: Kristine Atkinson, female    DOB: March 20, 1955, 58 y.o.   MRN: [id number] CC: medication refill  HPI 58 yo F presents for f/u visit to discuss the following:  1. CHRONIC HYPERTENSION  Disease Monitoring  Blood pressure range: unsure, but high when she checks at drug store   Chest pain: no   Dyspnea: no   Claudication: yes, leg cramps    Medication compliance: yes  Medication Side Effects  Lightheadedness: no   Urinary frequency: no   Edema: no   Impotence: no   Preventitive Healthcare:  Exercise: no   Diet Pattern: 3 meals per day   Salt Restriction: most days   2. Leg cramps: still having cramps in her legs. Taking lyrica. Getting low on current supply. Taking flexeril as needed. Denies cocaine abuse. Admits to smoking.   Soc hx: patient denies cocaine abuse and illicit drug use. She admits to smoking cigarettes.  Review of Systems As per HPI     Objective:   Physical Exam BP 157/84  Pulse 88  Temp(Src) 98.2 F (36.8 C) (Oral)  Wt 208 lb (94.348 kg) General appearance: alert, cooperative and no distress Lungs: clear to auscultation bilaterally Heart: regular rate and rhythm, S1, S2 normal, no murmur, click, rub or gallop Extremities: extremities normal, atraumatic, no cyanosis or edema     Assessment & Plan:

## 2013-05-29 NOTE — Assessment & Plan Note (Signed)
A: persistent leg cramps. No other MSK complaints. Taking lyrica. Normal exam. Denies IDU. Refused f/u UDS today. P:  Gave rx for lyrica refill. Patient to take to [facility] program to get supply there.

## 2013-05-29 NOTE — Assessment & Plan Note (Signed)
A: patient denies use.  P: F/u UDS at her earliest convenience to resolve problem.

## 2013-05-29 NOTE — Addendum Note (Signed)
Addended by: Boykin Nearing on: 05/29/2013 10:40 AM   Modules accepted: Orders

## 2013-05-29 NOTE — Addendum Note (Signed)
Addended by: Boykin Nearing on: 05/29/2013 10:55 AM   Modules accepted: Orders

## 2013-06-06 ENCOUNTER — Encounter: Payer: Self-pay | Admitting: Family Medicine

## 2013-06-06 ENCOUNTER — Ambulatory Visit (INDEPENDENT_AMBULATORY_CARE_PROVIDER_SITE_OTHER): Payer: No Typology Code available for payment source | Admitting: Family Medicine

## 2013-06-06 VITALS — BP 138/81 | HR 97 | Temp 98.3°F | Ht 62.0 in | Wt 201.0 lb

## 2013-06-06 DIAGNOSIS — L0291 Cutaneous abscess, unspecified: Secondary | ICD-10-CM

## 2013-06-06 DIAGNOSIS — L0390 Cellulitis, unspecified: Secondary | ICD-10-CM

## 2013-06-06 HISTORY — DX: Cellulitis, unspecified: L03.90

## 2013-06-06 MED ORDER — SULFAMETHOXAZOLE-TMP DS 800-160 MG PO TABS: 1.0000 | ORAL_TABLET | Freq: Two times a day (BID) | ORAL | Status: AC

## 2013-06-06 NOTE — Progress Notes (Signed)
Subjective:     Patient ID: Kristine Atkinson, female   DOB: Jun 13, 1955, 58 y.o.   MRN: [id number]  HPI Right foot swollen: Patient c/o of pain,redness and swelling of her right foot which started yesterday after she worked on her yard wearing her boot for a prolonged period. Initially she started with itching over her right 4th toes, gradually her toe became red and swollen. She denies any bite, no trauma. She mentioned she had similar episode in the past and was treated for bacterial infection. Her pain now is about 8/10 in severity, worse with palpation or pressure over her foot.  Current Outpatient Prescriptions on File Prior to Visit  Medication Sig Dispense Refill  . cyclobenzaprine (FLEXERIL) 10 MG tablet Take 1 tablet (10 mg total) by mouth 3 (three) times daily as needed for muscle spasms.  60 tablet  3  . hydrochlorothiazide (HYDRODIURIL) 25 MG tablet Take 0.5 tablets (12.5 mg total) by mouth daily.  90 tablet  1  . lisinopril (PRINIVIL,ZESTRIL) 40 MG tablet Take 1 tablet (40 mg total) by mouth daily.  90 tablet  3  . metFORMIN (GLUCOPHAGE) 850 MG tablet Take 1 tablet (850 mg total) by mouth 2 (two) times daily with a meal.  60 tablet  5  . omeprazole (PRILOSEC) 20 MG capsule Take 2 capsules (40 mg total) by mouth daily.  180 capsule  3  . pregabalin (LYRICA) 225 MG capsule Take 1 capsule (225 mg total) by mouth 2 (two) times daily.  180 capsule  3  . rosuvastatin (CRESTOR) 20 MG tablet Take 1 tablet (20 mg total) by mouth daily.  90 tablet  3   No current facility-administered medications on file prior to visit.   Past Medical History  Diagnosis Date  . Chronic pain   . Diabetes mellitus   . Hypertension   . Fibromyalgia   . Cocaine abuse 09/09/2012    Cocaine positive on UDS 09/09/2012.        Review of Systems  Respiratory: Negative.   Cardiovascular: Negative.   Gastrointestinal: Negative.   Musculoskeletal:       Foot pain  All other systems reviewed and are  negative.  Filed Vitals:   06/06/13 0854  BP: 138/81  Pulse: 97  Temp: 98.3 F (36.8 C)  TempSrc: Oral  Height: 5\' 2"  (1.575 m)  Weight: 201 lb (91.173 kg)       Objective:   Physical Exam  Nursing note and vitals reviewed. Constitutional: She appears well-developed. No distress.  Cardiovascular: Normal rate, regular rhythm, normal heart sounds and intact distal pulses.   No murmur heard. Pulmonary/Chest: Effort normal and breath sounds normal. No respiratory distress. She has no wheezes. She exhibits no tenderness.  Musculoskeletal:       Left foot: Normal.       Feet:       Assessment:     Cellulitis     Plan:     Check problem list.

## 2013-06-06 NOTE — Assessment & Plan Note (Signed)
Right toe: Mild or newly developing. Trigger unclear. DM2 risk factor. No abscess formation. Patient started on Bactrim DS for 7 days. Warm compression recommended. Tylenol prn pain. Patient instructed to return or go to the ED if symptom worsens. She verbalized understanding.

## 2013-06-06 NOTE — Patient Instructions (Signed)
It was nice seeing you Kristine Atkinson, I am however sorry about your foot pain, it seem you are developing skin infection, I will like for you to start Bactrim antibiotic, keep an eye on your foot, if the swelling and redness worsen please return soon or go to the ER.   Cellulitis Cellulitis is an infection of the skin and the tissue under the skin. The infected area is usually red and tender. This happens most often in the arms and lower legs. HOME CARE   Take your antibiotic medicine as told. Finish the medicine even if you start to feel better.  Keep the infected arm or leg raised (elevated).  Put a warm cloth on the area up to 4 times per day.  Only take medicines as told by your doctor.  Keep all doctor visits as told. GET HELP RIGHT AWAY IF:   You have a fever.  You feel very sleepy.  You throw up (vomit) or have watery poop (diarrhea).  You feel sick and have muscle aches and pains.  You see red streaks on the skin coming from the infected area.  Your red area gets bigger or turns a dark color.  Your bone or joint under the infected area is painful after the skin heals.  Your infection comes back in the same area or different area.  You have a puffy (swollen) bump in the infected area.  You have new symptoms. MAKE SURE YOU:   Understand these instructions.  Will watch your condition.  Will get help right away if you are not doing well or get worse. Document Released: 06/17/2007 Document Revised: 06/30/2011 Document Reviewed: 03/16/2011 [facility] Patient Information 2014 [place], [place].

## 2013-06-08 ENCOUNTER — Telehealth: Payer: Self-pay | Admitting: Family Medicine

## 2013-06-08 DIAGNOSIS — M797 Fibromyalgia: Secondary | ICD-10-CM

## 2013-06-08 MED ORDER — PREGABALIN 225 MG PO CAPS: 225.0000 mg | ORAL_CAPSULE | Freq: Two times a day (BID) | ORAL | Status: DC

## 2013-06-08 NOTE — Telephone Encounter (Signed)
LM for patient to call back.  Please make her aware that a new application for medication will be mailed to her and that she will need to have her part filled out with proof of income before dropping it off.  Thanks [employer]

## 2013-06-08 NOTE — Telephone Encounter (Signed)
Please inform patient Faxed new lyrica rx to [facility] fax # [phone], 90 day supply. I was informed that patient's current application will expire on 06/27/13.  A new application is needed yearly to reassess eligibility. She will be mailed a new application.  Advise patient drop off the application with her part completed and documentation of income, so I can complete my part and send it in.  I was informed that patient does not have to provide ID as [employer] has her ID on file.

## 2013-06-10 ENCOUNTER — Emergency Department (INDEPENDENT_AMBULATORY_CARE_PROVIDER_SITE_OTHER)
Admission: EM | Admit: 2013-06-10 | Discharge: 2013-06-10 | Disposition: A | Discharge status: Home / Self Care | Payer: Self-pay | Source: Home / Self Care

## 2013-06-10 ENCOUNTER — Encounter (HOSPITAL_COMMUNITY): Payer: Self-pay | Admitting: Emergency Medicine

## 2013-06-10 DIAGNOSIS — L02619 Cutaneous abscess of unspecified foot: Secondary | ICD-10-CM

## 2013-06-10 DIAGNOSIS — L03039 Cellulitis of unspecified toe: Secondary | ICD-10-CM

## 2013-06-10 DIAGNOSIS — L03031 Cellulitis of right toe: Secondary | ICD-10-CM

## 2013-06-10 MED ORDER — SILVER SULFADIAZINE 1 % EX CREA: TOPICAL_CREAM | Freq: Once | CUTANEOUS | Status: DC

## 2013-06-10 MED ORDER — CIPROFLOXACIN HCL 500 MG PO TABS: 500.0000 mg | ORAL_TABLET | Freq: Two times a day (BID) | ORAL | Status: DC

## 2013-06-10 MED ORDER — CEPHALEXIN 500 MG PO CAPS: 500.0000 mg | ORAL_CAPSULE | Freq: Four times a day (QID) | ORAL | Status: DC

## 2013-06-10 NOTE — Discharge Instructions (Signed)
Cellulitis Cellulitis is an infection of the skin and the tissue beneath it. The infected area is usually red and tender. Cellulitis occurs most often in the arms and lower legs.  CAUSES  Cellulitis is caused by bacteria that enter the skin through cracks or cuts in the skin. The most common types of bacteria that cause cellulitis are Staphylococcus and Streptococcus. SYMPTOMS   Redness and warmth.  Swelling.  Tenderness or pain.  Fever. DIAGNOSIS  Your caregiver can usually determine what is wrong based on a physical exam. Blood tests may also be done. TREATMENT  Treatment usually involves taking an antibiotic medicine. HOME CARE INSTRUCTIONS   Take your antibiotics as directed. Finish them even if you start to feel better.  Keep the infected arm or leg elevated to reduce swelling.  Apply a warm cloth to the affected area up to 4 times per day to relieve pain. OR SOAK IN WARM SALT WATER 3-4 TIMES A DAY   Only take over-the-counter or prescription medicines for pain, discomfort, or fever as directed by your caregiver.  Keep all follow-up appointments as directed by your caregiver. SEEK MEDICAL CARE IF:   You notice red streaks coming from the infected area.  Your red area gets larger or turns dark in color.  Your bone or joint underneath the infected area becomes painful after the skin has healed.  Your infection returns in the same area or another area.  You notice a swollen bump in the infected area.  You develop new symptoms. SEEK IMMEDIATE MEDICAL CARE IF:   You have a fever.  You feel very sleepy.  You develop vomiting or diarrhea.  You have a general ill feeling (malaise) with muscle aches and pains. MAKE SURE YOU:   Understand these instructions.  Will watch your condition.  Will get help right away if you are not doing well or get worse. Document Released: 10/08/2004 Document Revised: 06/30/2011 Document Reviewed: 03/16/2011 [facility] Patient  Information 2014 [place].

## 2013-06-10 NOTE — ED Provider Notes (Signed)
Medical screening examination/treatment/procedure(s) were performed by resident physician or non-physician practitioner and as supervising physician I was immediately available for consultation/collaboration.   Pauline Good MD.   Billy Fischer, MD 06/10/13 [phone]

## 2013-06-10 NOTE — ED Provider Notes (Signed)
CSN: [id number]     Arrival date & time 06/10/13  0919 History   First MD Initiated Contact with Patient 06/10/13 [phone]     Chief Complaint  Patient presents with  . Blister   (Consider location/radiation/quality/duration/timing/severity/associated sxs/prior Treatment) HPI Comments: Possibly one week ago this 58 year old female developed a small blister to the right fourth toe associated with cellulitis. She had been wearing a boot while working in the yard and this apparently had been rubbing on her toes and calls to blister followed by saline this. She saw her PCP who prescribed Septra. She returns 5 days later with no apparent improvement the vesicle of the right fourth toe is intact there is erythema to the toe as well as the distal forefoot. It is painful as it was before. She does not see any improvement.   Past Medical History  Diagnosis Date  . Chronic pain   . Diabetes mellitus   . Hypertension   . Fibromyalgia   . Cocaine abuse 09/09/2012    Cocaine positive on UDS 09/09/2012.     Past Surgical History  Procedure Laterality Date  . Oophorectomy      ectopic   Family History  Problem Relation Age of Onset  . Cancer Maternal Aunt     colon cancer  . Hypertension Mother    History  Substance Use Topics  . Smoking status: Current Every Day Smoker -- 0.50 packs/day  . Smokeless tobacco: Never Used     Comment: as of 05/13/11 about 3 cig a day  . Alcohol Use: No   OB History   Grav Para Term Preterm Abortions TAB SAB Ect Mult Living   6 3 2 1 3 2 1  0 0 3     Review of Systems  Constitutional: Negative.   Skin: Positive for wound.       As per history of present illness  All other systems reviewed and are negative.   Allergies  Penicillins  Home Medications   Prior to Admission medications   Medication Sig Start Date End Date Taking? Authorizing Provider  hydrochlorothiazide (HYDRODIURIL) 25 MG tablet Take 0.5 tablets (12.5 mg total) by mouth daily. 05/29/13  Yes  Josalyn C Funches, MD  lisinopril (PRINIVIL,ZESTRIL) 40 MG tablet Take 1 tablet (40 mg total) by mouth daily. 03/20/13  Yes Josalyn C Funches, MD  metFORMIN (GLUCOPHAGE) 850 MG tablet Take 1 tablet (850 mg total) by mouth 2 (two) times daily with a meal. 03/20/13  Yes Josalyn C Funches, MD  pregabalin (LYRICA) 225 MG capsule Take 1 capsule (225 mg total) by mouth 2 (two) times daily. 06/08/13  Yes Josalyn C Funches, MD  rosuvastatin (CRESTOR) 20 MG tablet Take 1 tablet (20 mg total) by mouth daily. 03/20/13  Yes Josalyn C Funches, MD  sulfamethoxazole-trimethoprim (BACTRIM DS) 800-160 MG per tablet Take 1 tablet by mouth 2 (two) times daily. 06/06/13 06/13/13 Yes Andrena Mews, MD  cephALEXin (KEFLEX) 500 MG capsule Take 1 capsule (500 mg total) by mouth 4 (four) times daily. 06/10/13   Janne Napoleon, NP  ciprofloxacin (CIPRO) 500 MG tablet Take 1 tablet (500 mg total) by mouth 2 (two) times daily. 06/10/13   Janne Napoleon, NP  cyclobenzaprine (FLEXERIL) 10 MG tablet Take 1 tablet (10 mg total) by mouth 3 (three) times daily as needed for muscle spasms. 03/20/13   Josalyn C Funches, MD  omeprazole (PRILOSEC) 20 MG capsule Take 2 capsules (40 mg total) by mouth daily. 11/07/12   Josalyn C Funches,  MD   BP 112/67  Pulse 86  Temp(Src) 98.3 F (36.8 C) (Oral)  Resp 16  SpO2 93% Physical Exam  Nursing note and vitals reviewed. Constitutional: She is oriented to person, place, and time. She appears well-developed and well-nourished. No distress.  Pulmonary/Chest: Effort normal. No respiratory distress.  Neurological: She is alert and oriented to person, place, and time.  Skin: Skin is warm and dry.  Vesicle to the dorsal aspect of the R 4th toe intact with associated toe erythema and swelling. Erythema extending into the forefoot. Local tenderness. Pedal pulse 2 +. No lymphangitis.   Psychiatric: She has a normal mood and affect.    ED Course  Procedures (including critical care time) Labs Review Labs Reviewed  - No data to display  Imaging Review No results found.   MDM   1. Cellulitis of toe of right foot    bETADINE SOAK AFTER rupture of blister Silvadene dressing Warm salt water soaks Stop the septra Start keflex and cipro.     Consulted with Dr Juventino Slovak See your PCP next week. Pending culture     Janne Napoleon, NP 06/10/13 1024

## 2013-06-10 NOTE — ED Notes (Signed)
Pt triaged and assessed by provider.   Provider in before nurse. 

## 2013-06-12 NOTE — ED Notes (Signed)
Culture report pending

## 2013-06-14 LAB — CULTURE, ROUTINE-ABSCESS
CULTURE: NO GROWTH
GRAM STAIN: NONE SEEN

## 2013-06-22 ENCOUNTER — Encounter: Payer: Self-pay | Admitting: Family Medicine

## 2013-06-22 NOTE — Progress Notes (Signed)
Patient dropped off medication assistance papers to be filled out.  Please call her when completed.

## 2013-06-26 NOTE — Progress Notes (Signed)
Placed in MDs box. Kristine Atkinson  

## 2013-06-27 ENCOUNTER — Other Ambulatory Visit: Payer: Self-pay | Admitting: Family Medicine

## 2013-06-27 DIAGNOSIS — M797 Fibromyalgia: Secondary | ICD-10-CM

## 2013-06-27 MED ORDER — PREGABALIN 225 MG PO CAPS: 225.0000 mg | ORAL_CAPSULE | Freq: Two times a day (BID) | ORAL | Status: DC

## 2013-06-27 NOTE — Progress Notes (Signed)
Patient ID: Kristine Atkinson, female   DOB: 1955/04/04, 58 y.o.   MRN: [id number] Form completed with Dr. Verlon Au information and signed by her since I have only two weeks left at this practice and will not be responsible for her medication after 07/11/13.

## 2013-06-27 NOTE — Progress Notes (Signed)
Pt informed that form is completed and ready for pick up.  Derl Barrow, RN

## 2013-06-27 NOTE — Progress Notes (Signed)
I will manage her lyrica as Dr Adrian Blackwater is leaving the practice and she needs someone to agree for next year in order to get it through the company. i have signed forma and sent Rx.

## 2013-07-05 ENCOUNTER — Ambulatory Visit: Payer: No Typology Code available for payment source

## 2013-08-03 ENCOUNTER — Emergency Department (HOSPITAL_COMMUNITY)
Admission: EM | Admit: 2013-08-03 | Discharge: 2013-08-03 | Disposition: A | Discharge status: Home / Self Care | Payer: No Typology Code available for payment source | Attending: Emergency Medicine | Admitting: Emergency Medicine

## 2013-08-03 ENCOUNTER — Encounter (HOSPITAL_COMMUNITY): Payer: Self-pay | Admitting: Emergency Medicine

## 2013-08-03 DIAGNOSIS — Z880 Allergy status to penicillin: Secondary | ICD-10-CM | POA: Insufficient documentation

## 2013-08-03 DIAGNOSIS — IMO0001 Reserved for inherently not codable concepts without codable children: Secondary | ICD-10-CM | POA: Insufficient documentation

## 2013-08-03 DIAGNOSIS — Z79899 Other long term (current) drug therapy: Secondary | ICD-10-CM | POA: Insufficient documentation

## 2013-08-03 DIAGNOSIS — R55 Syncope and collapse: Secondary | ICD-10-CM | POA: Insufficient documentation

## 2013-08-03 DIAGNOSIS — I952 Hypotension due to drugs: Secondary | ICD-10-CM

## 2013-08-03 DIAGNOSIS — G8929 Other chronic pain: Secondary | ICD-10-CM | POA: Insufficient documentation

## 2013-08-03 DIAGNOSIS — I10 Essential (primary) hypertension: Secondary | ICD-10-CM | POA: Insufficient documentation

## 2013-08-03 DIAGNOSIS — F17200 Nicotine dependence, unspecified, uncomplicated: Secondary | ICD-10-CM | POA: Insufficient documentation

## 2013-08-03 DIAGNOSIS — I959 Hypotension, unspecified: Secondary | ICD-10-CM | POA: Insufficient documentation

## 2013-08-03 DIAGNOSIS — N179 Acute kidney failure, unspecified: Secondary | ICD-10-CM | POA: Insufficient documentation

## 2013-08-03 DIAGNOSIS — T465X5A Adverse effect of other antihypertensive drugs, initial encounter: Secondary | ICD-10-CM | POA: Insufficient documentation

## 2013-08-03 DIAGNOSIS — E119 Type 2 diabetes mellitus without complications: Secondary | ICD-10-CM | POA: Insufficient documentation

## 2013-08-03 LAB — CBC WITH DIFFERENTIAL/PLATELET
BASOS PCT: 0 % (ref 0–1)
Basophils Absolute: 0 10*3/uL (ref 0.0–0.1)
Eosinophils Absolute: 0.3 10*3/uL (ref 0.0–0.7)
Eosinophils Relative: 2 % (ref 0–5)
HCT: 43.3 % (ref 36.0–46.0)
Hemoglobin: 14.1 g/dL (ref 12.0–15.0)
LYMPHS PCT: 38 % (ref 12–46)
Lymphs Abs: 4.1 10*3/uL — ABNORMAL HIGH (ref 0.7–4.0)
MCH: 29.9 pg (ref 26.0–34.0)
MCHC: 32.6 g/dL (ref 30.0–36.0)
MCV: 91.9 fL (ref 78.0–100.0)
Monocytes Absolute: 0.8 10*3/uL (ref 0.1–1.0)
Monocytes Relative: 7 % (ref 3–12)
NEUTROS PCT: 53 % (ref 43–77)
Neutro Abs: 5.8 10*3/uL (ref 1.7–7.7)
PLATELETS: 292 10*3/uL (ref 150–400)
RBC: 4.71 MIL/uL (ref 3.87–5.11)
RDW: 16.5 % — AB (ref 11.5–15.5)
WBC: 10.9 10*3/uL — ABNORMAL HIGH (ref 4.0–10.5)

## 2013-08-03 LAB — BASIC METABOLIC PANEL
ANION GAP: 12 (ref 5–15)
BUN: 20 mg/dL (ref 6–23)
CHLORIDE: 106 meq/L (ref 96–112)
CO2: 22 meq/L (ref 19–32)
CREATININE: 1.89 mg/dL — AB (ref 0.50–1.10)
Calcium: 8.3 mg/dL — ABNORMAL LOW (ref 8.4–10.5)
GFR calc non Af Amer: 29 mL/min — ABNORMAL LOW (ref >90)
GFR, EST AFRICAN AMERICAN: 33 mL/min — AB (ref >90)
Glucose, Bld: 165 mg/dL — ABNORMAL HIGH (ref 70–99)
POTASSIUM: 3.8 meq/L (ref 3.7–5.3)
SODIUM: 140 meq/L (ref 137–147)

## 2013-08-03 LAB — CBG MONITORING, ED: GLUCOSE-CAPILLARY: 147 mg/dL — AB (ref 70–99)

## 2013-08-03 LAB — I-STAT TROPONIN, ED: TROPONIN I, POC: 0 ng/mL (ref 0.00–0.08)

## 2013-08-03 MED ORDER — SODIUM CHLORIDE 0.9 % IV BOLUS (SEPSIS)
1000.0000 mL | Freq: Once | INTRAVENOUS | Status: AC
  Administered 2013-08-03: 1000 mL via INTRAVENOUS

## 2013-08-03 NOTE — ED Notes (Signed)
Lauren, PA back in to speak with pt.

## 2013-08-03 NOTE — ED Notes (Signed)
States she took her BP medicine this morning at 0500 and took another 1/2 tablet because they won't let her donate plasma if her BP is high.

## 2013-08-03 NOTE — ED Notes (Signed)
CBG of 147

## 2013-08-03 NOTE — Discharge Planning (Signed)
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 This liaison made a follow up appointment with the pts pcp for Monday Aug 3,2015 at 10:15, pt verbalized understanding of the upcoming appointment. [facility] case manager will follow up with the patient once discharged. My contact information provided to the pt for any future questions or concerns. No other community liaison needs identified at this time.

## 2013-08-03 NOTE — ED Provider Notes (Signed)
CSN: [id number]     Arrival date & time 08/03/13  1346 History   First MD Initiated Contact with Patient 08/03/13 1351     No chief complaint on file.    (Consider location/radiation/quality/duration/timing/severity/associated sxs/prior Treatment) HPI Comments: The patient is a 58 year old female past medical history of hypertension, diabetes, presenting emergency room chief complaint of lightheadedness since today. The patient reports taking 1.5 tablets of her lisinopril at approximately 5 AM this morning. The patient reports she was donating plasma and has been turned away in the past due to elevated blood pressure readings. She reports lightheadedness with generalized hot flashes.  Denies syncope or loss of consciousness or fall. The patient denies fever, chills, chest pain, palpitations, shortness of breath, weakness, tingling, nausea, vomiting.  Patient is a 58 y.o. female presenting with near-syncope. The history is provided by the patient. No language interpreter was used.  Near Syncope Pertinent negatives include no abdominal pain, chest pain, chills, fever, headaches, nausea, vomiting or weakness.    Past Medical History  Diagnosis Date  . Chronic pain   . Diabetes mellitus   . Hypertension   . Fibromyalgia   . Cocaine abuse 09/09/2012    Cocaine positive on UDS 09/09/2012.     Past Surgical History  Procedure Laterality Date  . Oophorectomy      ectopic   Family History  Problem Relation Age of Onset  . Cancer Maternal Aunt     colon cancer  . Hypertension Mother    History  Substance Use Topics  . Smoking status: Current Every Day Smoker -- 0.50 packs/day  . Smokeless tobacco: Never Used     Comment: as of 05/13/11 about 3 cig a day  . Alcohol Use: No   OB History   Grav Para Term Preterm Abortions TAB SAB Ect Mult Living   6 3 2 1 3 2 1  0 0 3     Review of Systems  Constitutional: Negative for fever and chills.  Eyes: Negative for visual disturbance.   Respiratory: Negative for shortness of breath.   Cardiovascular: Positive for near-syncope. Negative for chest pain and palpitations.  Gastrointestinal: Negative for nausea, vomiting, abdominal pain and diarrhea.  Neurological: Positive for light-headedness. Negative for dizziness, syncope, weakness and headaches.      Allergies  Penicillins  Home Medications   Prior to Admission medications   Medication Sig Start Date End Date Taking? Authorizing Provider  cyclobenzaprine (FLEXERIL) 10 MG tablet Take 1 tablet (10 mg total) by mouth 3 (three) times daily as needed for muscle spasms. 03/20/13   Josalyn C Funches, MD  hydrochlorothiazide (HYDRODIURIL) 25 MG tablet Take 0.5 tablets (12.5 mg total) by mouth daily. 05/29/13   Josalyn C Funches, MD  lisinopril (PRINIVIL,ZESTRIL) 40 MG tablet Take 1 tablet (40 mg total) by mouth daily. 03/20/13   Minerva Ends, MD  metFORMIN (GLUCOPHAGE) 850 MG tablet Take 1 tablet (850 mg total) by mouth 2 (two) times daily with a meal. 03/20/13   Josalyn C Funches, MD  omeprazole (PRILOSEC) 20 MG capsule Take 2 capsules (40 mg total) by mouth daily. 11/07/12   Josalyn C Funches, MD  pregabalin (LYRICA) 225 MG capsule Take 1 capsule (225 mg total) by mouth 2 (two) times daily. 06/27/13   Dickie La, MD  rosuvastatin (CRESTOR) 20 MG tablet Take 1 tablet (20 mg total) by mouth daily. 03/20/13   Minerva Ends, MD   There were no vitals taken for this visit. Physical Exam  Nursing note and vitals reviewed. Constitutional: She is oriented to person, place, and time. She appears well-developed and well-nourished.  Non-toxic appearance. She does not have a sickly appearance. She does not appear ill. No distress.  HENT:  Head: Normocephalic and atraumatic.  Eyes: EOM are normal. Pupils are equal, round, and reactive to light. Right eye exhibits no discharge. Left eye exhibits no discharge. No scleral icterus.  Neck: Normal range of motion. Neck supple.   Cardiovascular: Normal rate and regular rhythm.   No murmur heard. No lower extremity edema  Pulmonary/Chest: Effort normal and breath sounds normal. She has no wheezes. She has no rales. She exhibits no tenderness.  Abdominal: Soft. Bowel sounds are normal. She exhibits no distension. There is no tenderness. There is no rebound and no guarding.  Musculoskeletal: Normal range of motion. She exhibits no edema.  Neurological: She is alert and oriented to person, place, and time. No cranial nerve deficit. Coordination normal.  Speech is clear and goal oriented, follows commands Cranial nerves III - XII grossly intact, no facial droop Normal strength in upper and lower extremities bilaterally, strong and equal grip strength Sensation normal to light touch Moves all 4 extremities without ataxia, coordination intact Normal finger to nose and rapid alternating movements No pronator drift  Skin: Skin is warm and dry. No rash noted. She is not diaphoretic.  Psychiatric: She has a normal mood and affect. Her behavior is normal.    ED Course  Procedures (including critical care time) Labs Review Labs Reviewed - No data to display  Results for orders placed during the hospital encounter of 08/03/13  CBC WITH DIFFERENTIAL      Result Value Ref Range   WBC 10.9 (*) 4.0 - 10.5 K/uL   RBC 4.71  3.87 - 5.11 MIL/uL   Hemoglobin 14.1  12.0 - 15.0 g/dL   HCT 43.3  36.0 - 46.0 %   MCV 91.9  78.0 - 100.0 fL   MCH 29.9  26.0 - 34.0 pg   MCHC 32.6  30.0 - 36.0 g/dL   RDW 16.5 (*) 11.5 - 15.5 %   Platelets 292  150 - 400 K/uL   Neutrophils Relative % 53  43 - 77 %   Neutro Abs 5.8  1.7 - 7.7 K/uL   Lymphocytes Relative 38  12 - 46 %   Lymphs Abs 4.1 (*) 0.7 - 4.0 K/uL   Monocytes Relative 7  3 - 12 %   Monocytes Absolute 0.8  0.1 - 1.0 K/uL   Eosinophils Relative 2  0 - 5 %   Eosinophils Absolute 0.3  0.0 - 0.7 K/uL   Basophils Relative 0  0 - 1 %   Basophils Absolute 0.0  0.0 - 0.1 K/uL  BASIC  METABOLIC PANEL      Result Value Ref Range   Sodium 140  137 - 147 mEq/L   Potassium 3.8  3.7 - 5.3 mEq/L   Chloride 106  96 - 112 mEq/L   CO2 22  19 - 32 mEq/L   Glucose, Bld 165 (*) 70 - 99 mg/dL   BUN 20  6 - 23 mg/dL   Creatinine, Ser 1.89 (*) 0.50 - 1.10 mg/dL   Calcium 8.3 (*) 8.4 - 10.5 mg/dL   GFR calc non Af Amer 29 (*) >90 mL/min   GFR calc Af Amer 33 (*) >90 mL/min   Anion gap 12  5 - 15  CBG MONITORING, ED  Result Value Ref Range   Glucose-Capillary 147 (*) 70 - 99 mg/dL  I-STAT TROPOININ, ED      Result Value Ref Range   Troponin i, poc 0.00  0.00 - 0.08 ng/mL   Comment 3            No results found.   Imaging Review No results found.   EKG Interpretation   Date/Time:  Thursday August 03 2013 13:55:37 EDT Ventricular Rate:  89 PR Interval:  174 QRS Duration: 78 QT Interval:  408 QTC Calculation: 496 R Axis:   78 Text Interpretation:  Sinus rhythm Consider left atrial enlargement  Borderline ST elevation, lateral leads Borderline prolonged QT interval  Baseline wander in lead(s) I III aVL ED PHYSICIAN INTERPRETATION AVAILABLE  IN [facility] Confirmed by TEST, Record ([id number]) on 08/05/2013 7:11:02  AM      MDM   Final diagnoses:  Hypotension due to drugs  Acute kidney injury   Patient presents with lightheadedness, blood pressure on arrival 75/52. Hypotensive likely do to increase medication in order to donate plasma. No neurologic deficits on exam. Patient was immediately placed in Trendelenburg and  fluid boluses ordered. The patient reported moderate resolution of symptoms in Trendelenburg. Per pharmacy lisinopril take 6-7 hours, hypotension approximately 7 hours, half-life 12 hours.  Patient time past 7 hour mark will continue to hydrate and reevaluate. 1535 reevaluation patient resting comfortably in room. Reports resolution of symptoms, requesting to go home and eat in ED. Blood pressure 135/97. SpO2 is 100% on room air, denies shortness of  breath. Dr. Tawnya Crook also evaluated the patient during this encounter.  Discussed lab results, and treatment plan with the patient. Advised pt to f/u with PCP for renal function testing and to only take prescribed hypertension medications.Return precautions given. Reports understanding and no other concerns at this time.  Patient is stable for discharge at this time.  Meds given in ED:  Medications  sodium chloride 0.9 % bolus 1,000 mL (0 mLs Intravenous Stopped 08/03/13 1453)    New Prescriptions   No medications on file     Lorrine Kin, PA-C 08/05/13 1117

## 2013-08-03 NOTE — Discharge Instructions (Signed)
Call for a follow up appointment with a Family or Primary Care Provider for repeat kidney function blood test.  Return if Symptoms worsen.   Take medication as prescribed, do not take more Lisinopril (blood pressure medication), or any other medication than you are prescribed.

## 2013-08-03 NOTE — ED Notes (Signed)
Pt was brought downstairs by staff while visiting her husband states she didn't feel good. Pt reports feeling like she was going to pass out earlier and having some diffuse abd pain. Pt is diabetic and reports eating something a little before all of this happened.

## 2013-08-03 NOTE — ED Notes (Signed)
Lauren, PA at the bedside.

## 2013-08-10 ENCOUNTER — Encounter: Payer: Self-pay | Admitting: Family Medicine

## 2013-08-10 ENCOUNTER — Ambulatory Visit: Payer: No Typology Code available for payment source | Admitting: *Deleted

## 2013-08-10 VITALS — BP 90/66 | HR 100

## 2013-08-10 DIAGNOSIS — I959 Hypotension, unspecified: Secondary | ICD-10-CM

## 2013-08-10 NOTE — ED Provider Notes (Signed)
Medical screening examination/treatment/procedure(s) were conducted as a shared visit with non-physician practitioner(s) and myself.  I personally evaluated the patient during the encounter.   EKG Interpretation   Date/Time:  Thursday August 03 2013 13:55:37 EDT Ventricular Rate:  89 PR Interval:  174 QRS Duration: 78 QT Interval:  408 QTC Calculation: 496 R Axis:   78 Text Interpretation:  Sinus rhythm Consider left atrial enlargement  Borderline ST elevation, lateral leads Borderline prolonged QT interval  Baseline wander in lead(s) I III aVL ED PHYSICIAN INTERPRETATION AVAILABLE  IN [facility] Confirmed by TEST, Record ([id number]) on 08/05/2013 7:11:02  AM      Pt presents w/ lightheadedness and hypotension after taking extra lisinopril to donate plasma. On my PE, Pt still hypotensive, but otherwise in NAD, mentaing well. Cardiopulm exam benign. Will treat w/ IVF, monitor until BP normalizes.   Neta Ehlers, MD 08/10/13 [id number]

## 2013-08-10 NOTE — Patient Instructions (Signed)
Kristine Atkinson precepted patient with me, I recommended patient to be seen today, d/c HCTZ and reduce Lisinopril to 20 mg from 40mg . If unable to see here today since she is symptomatic, I recommended sending her to the ED. Rudi Rummage will try to schedule her to see a resident today.

## 2013-08-10 NOTE — Progress Notes (Signed)
Patient in today for BP check. Complains of bp running low recently. Blood pressure taken manually in the let arm was 90/66. Patient complains of being tired but refuses same day appt. Current medications for BP include lisinopril and HCTZ. Precepted with Dr Gwendlyn Deutscher who recommends that patient stop the HCTZ and push fluids with close follow up. Patient come back tomm for follow up, patient also has follow up with PCP on 8/3. FYI to PCP.

## 2013-08-14 ENCOUNTER — Ambulatory Visit (INDEPENDENT_AMBULATORY_CARE_PROVIDER_SITE_OTHER): Payer: No Typology Code available for payment source | Admitting: Family Medicine

## 2013-08-14 ENCOUNTER — Ambulatory Visit (HOSPITAL_COMMUNITY)
Admission: RE | Admit: 2013-08-14 | Discharge: 2013-08-14 | Disposition: A | Discharge status: Home / Self Care | Payer: No Typology Code available for payment source | Source: Ambulatory Visit | Attending: Family Medicine | Admitting: Family Medicine

## 2013-08-14 ENCOUNTER — Encounter: Payer: Self-pay | Admitting: Family Medicine

## 2013-08-14 VITALS — BP 128/78 | HR 70 | Temp 98.1°F | Wt 197.0 lb

## 2013-08-14 DIAGNOSIS — IMO0001 Reserved for inherently not codable concepts without codable children: Secondary | ICD-10-CM

## 2013-08-14 DIAGNOSIS — Z720 Tobacco use: Secondary | ICD-10-CM

## 2013-08-14 DIAGNOSIS — R825 Elevated urine levels of drugs, medicaments and biological substances: Secondary | ICD-10-CM

## 2013-08-14 DIAGNOSIS — R799 Abnormal finding of blood chemistry, unspecified: Secondary | ICD-10-CM

## 2013-08-14 DIAGNOSIS — M797 Fibromyalgia: Secondary | ICD-10-CM

## 2013-08-14 DIAGNOSIS — F1410 Cocaine abuse, uncomplicated: Secondary | ICD-10-CM

## 2013-08-14 DIAGNOSIS — R05 Cough: Secondary | ICD-10-CM

## 2013-08-14 DIAGNOSIS — R55 Syncope and collapse: Secondary | ICD-10-CM

## 2013-08-14 DIAGNOSIS — R059 Cough, unspecified: Secondary | ICD-10-CM

## 2013-08-14 DIAGNOSIS — F17200 Nicotine dependence, unspecified, uncomplicated: Secondary | ICD-10-CM

## 2013-08-14 DIAGNOSIS — I10 Essential (primary) hypertension: Secondary | ICD-10-CM

## 2013-08-14 DIAGNOSIS — R7989 Other specified abnormal findings of blood chemistry: Secondary | ICD-10-CM

## 2013-08-14 DIAGNOSIS — R892 Abnormal level of other drugs, medicaments and biological substances in specimens from other organs, systems and tissues: Secondary | ICD-10-CM

## 2013-08-14 HISTORY — DX: Syncope and collapse: R55

## 2013-08-14 HISTORY — DX: Other specified abnormal findings of blood chemistry: R79.89

## 2013-08-14 LAB — BASIC METABOLIC PANEL
BUN: 15 mg/dL (ref 6–23)
CALCIUM: 9.1 mg/dL (ref 8.4–10.5)
CO2: 26 meq/L (ref 19–32)
CREATININE: 0.93 mg/dL (ref 0.50–1.10)
Chloride: 103 mEq/L (ref 96–112)
Glucose, Bld: 81 mg/dL (ref 70–99)
Potassium: 4.6 mEq/L (ref 3.5–5.3)
SODIUM: 138 meq/L (ref 135–145)

## 2013-08-14 LAB — GLUCOSE, CAPILLARY: Glucose-Capillary: 77 mg/dL (ref 70–99)

## 2013-08-14 LAB — POCT UA - MICROSCOPIC ONLY

## 2013-08-14 LAB — POCT URINALYSIS DIPSTICK
Bilirubin, UA: NEGATIVE
Blood, UA: NEGATIVE
GLUCOSE UA: NEGATIVE
Ketones, UA: NEGATIVE
Leukocytes, UA: NEGATIVE
Nitrite, UA: NEGATIVE
PROTEIN UA: NEGATIVE
SPEC GRAV UA: 1.015
Urobilinogen, UA: 0.2
pH, UA: 5.5

## 2013-08-14 MED ORDER — PREGABALIN 225 MG PO CAPS: 225.0000 mg | ORAL_CAPSULE | Freq: Two times a day (BID) | ORAL | Status: DC

## 2013-08-14 MED ORDER — CYCLOBENZAPRINE HCL 10 MG PO TABS: 10.0000 mg | ORAL_TABLET | Freq: Three times a day (TID) | ORAL | Status: DC | PRN

## 2013-08-14 MED ORDER — ALBUTEROL SULFATE HFA 108 (90 BASE) MCG/ACT IN AERS: 2.0000 | INHALATION_SPRAY | Freq: Four times a day (QID) | RESPIRATORY_TRACT | Status: DC | PRN

## 2013-08-14 NOTE — Assessment & Plan Note (Signed)
Serum creatinine elevated on 08/03/13. -recheck BMP and UA

## 2013-08-14 NOTE — Assessment & Plan Note (Signed)
Nonproductive cough intermittently for 6 months. Suspect due to tobacco abuse.  -Advised smoking cessation -attempt trial of albuterol PRN cough -consider PFT's if cough persists

## 2013-08-14 NOTE — Assessment & Plan Note (Addendum)
Patient currently on Lyrica and Flexeril for Fibromyalgia pain. Suspect that these medications may be contributing to syncope/low blood pressure. Patient counseled to continue Lyrica but use Flexeril sparingly as this could have contributed to her symptoms. -Will discuss pain in more detail at future visits.  -Patient also requested refill of Lyrica, attempted to provide paper refill however patient stated that she needed it sent to [facility], please see conversation in overview section, it appears that she is not due for a refill. Suspect that she is using more than she is being prescribed. Will call and discuss with the patient once other labs have come back.

## 2013-08-14 NOTE — Patient Instructions (Addendum)
It was nice to meet you today.  Dr. Ree Kida will call you with your lab and urine results results.   Cough - please start to use albuterol every 6 hours as needed for cough.   You will be scheduled for an Echocardiogram (ultrasound of your heart) - your appointment is this coming Thursday August 6 at 3 pm, please go to the main office of the hospital and tell them that you are scheduled for an ultrasound of your heart.   I think that your Flexeril may be contributing to your symptoms, I have given you a small supply and we will discuss your pain in more detail at upcoming visits.   Please schedule follow up for 2 weeks.

## 2013-08-14 NOTE — Progress Notes (Signed)
   Subjective:    Patient ID: Kristine Atkinson, female    DOB: September 25, 1955, 58 y.o.   MRN: [id number]  HPI 58 y/o female presents for follow up of hypotension, patient was seen in the ED on 08/03/13 and found to be hypotensive after taking extra Lisinopril, she was planning to donate plasma and stated that she has been rejected before for elevated blood pressure, her BP improved before discharge from the ED after IVF, she was seen again for nurse visit on 08/10/13, she again was hypotensive and lightheaded, declined to be seen by a physician at that time, she was counseled to hold her HCTZ at that time, she has continued to take both her Lisinopril and HCTZ  Syncopal episode - patient was at her husbands doctors appointment on 7/31 and had a syncopal episode, she was not taken to the hospital as it was known that she has follow up with her PCP, no further dizziness/lightheadedness since that episode  Social - lives with husband, her husband drives and pays the bills, history of cocaine abuse   Review of Systems  Constitutional: Positive for chills. Negative for fever.  Respiratory: Positive for cough. Negative for chest tightness and shortness of breath.   Cardiovascular: Negative for chest pain.  Gastrointestinal: Positive for nausea. Negative for vomiting.  Neurological: Positive for headaches.  Diarrhea from Metformin Reports increased urination, no dysuria, no hematuria Cough for past 6 months, nonproductive  She reports "crippling" of her left hand occsionally that limits her ability to eat and care for self     Objective:   Physical Exam Vitals: reviewed; Orthostatics negative Gen: pleasant AAF, NAD Resp: diminished breath sounds at the lung bases Cardiac: RRR, S1 and S2 present, no murmurs, no carotid bruits Psych: alert and oriented X3, many flight of ideas during exam Neuro: CN 2-12 intact, strength 5/5 in all extremities, sensation to light touch grossly intact,  Patellar/Brachial/BR reflexes 2+ bilaterally, normal gait   Labwork from 08/03/13 BMP unremarkable except Cr elevated to 1.89 CBC was wnl except mildly elevated WBC 10.9, HBG 14.1 Troponin negative  EKG (08/14/13) - NSR, Twave inversion in V1, (unchanged from previous), no acute ischemic changes  No previous Echocardiogram      Assessment & Plan:  Please see problem specific assessment and plan.

## 2013-08-14 NOTE — Assessment & Plan Note (Signed)
Patient with recent hypotension. Blood pressure currently in normal limits. Orthostatics negative. -no change in therapy today

## 2013-08-14 NOTE — Assessment & Plan Note (Signed)
Patient has history of cocaine use.  -check UDS to rule out cocaine as cause of syncopal episode

## 2013-08-14 NOTE — Assessment & Plan Note (Signed)
Patient reports syncopal episode on 7/31 while accompanying husband to doctor's appointment. Patient currently asymptomatic. Cardiac and Neuro examination unremarkable. EKG and orthostatics wnl. No reported seizure activity.  -check 2D echocardiogram in setting of previous cocaine use

## 2013-08-14 NOTE — Assessment & Plan Note (Signed)
Advised patient on smoking cessation.

## 2013-08-15 LAB — DRUG SCREEN URINE W/ALC, NO CONF
AMPHETAMINE SCRN UR: NEGATIVE
Barbiturate Quant, Ur: NEGATIVE
Benzodiazepines.: NEGATIVE
Cocaine Metabolites: POSITIVE — AB
Creatinine,U: 83.4 mg/dL
Ethyl Alcohol: <10 mg/dL (ref <10)
MARIJUANA METABOLITE: NEGATIVE
METHADONE: NEGATIVE
Opiate Screen, Urine: NEGATIVE
Phencyclidine (PCP): NEGATIVE
Propoxyphene: NEGATIVE

## 2013-08-16 ENCOUNTER — Telehealth: Payer: Self-pay | Admitting: Family Medicine

## 2013-08-16 ENCOUNTER — Other Ambulatory Visit: Payer: Self-pay | Admitting: *Deleted

## 2013-08-16 MED ORDER — OMEPRAZOLE 20 MG PO CPDR: 20.0000 mg | DELAYED_RELEASE_CAPSULE | Freq: Two times a day (BID) | ORAL | Status: DC

## 2013-08-16 NOTE — Telephone Encounter (Signed)
Discussed results with patient

## 2013-08-16 NOTE — Telephone Encounter (Signed)
Called an discussed recent lab results with patient including Positive drug screen for cocaine. She was given prescription for Flexeril at last visit however in light of positive cocaine will need to reconsider refills of this medication.   I regards to medications. Lisinopril - has one refill listed on bottle, told to call pharmacy for refill HCTZ - also has refills Flexeril - prescription provided at recent visit, she said that she picked this up Omeprazole - refill provided Metformin - has available refills Lyrica - taking 3 times daily, does have some medication left, told to decrease to BID which is what is prescribed, see Overview note (fibromyalgia) from 08/15/13

## 2013-08-16 NOTE — Telephone Encounter (Signed)
Spoke with patient, reminder her that she received a paper rx for flexeril and albuterol on 8/3, Also informed patient that MD was working on getting her Lyrica rx to [facility]. Patient still very confused, patient husband then got on the phone and states that she needs HCTZ, Lisinopril and omeprazole sent to [facility]. Will forward to PCP.

## 2013-08-16 NOTE — Telephone Encounter (Signed)
Need refills sent to [facility].  Listed below are the meds to go there:  Lisinopril Hctz Flexeril Omeprazole  Need the Metformin to be sent to [facility] on [address].  Her Lyrica need to be sent to [place].  That is a mail order.

## 2013-08-17 ENCOUNTER — Encounter: Payer: Self-pay | Admitting: Family Medicine

## 2013-08-17 ENCOUNTER — Ambulatory Visit (HOSPITAL_COMMUNITY)
Admission: RE | Admit: 2013-08-17 | Discharge: 2013-08-17 | Disposition: A | Discharge status: Home / Self Care | Payer: Self-pay | Source: Ambulatory Visit | Attending: Family Medicine | Admitting: Family Medicine

## 2013-08-17 DIAGNOSIS — R55 Syncope and collapse: Secondary | ICD-10-CM | POA: Insufficient documentation

## 2013-08-17 DIAGNOSIS — I517 Cardiomegaly: Secondary | ICD-10-CM

## 2013-08-17 DIAGNOSIS — I5189 Other ill-defined heart diseases: Secondary | ICD-10-CM | POA: Insufficient documentation

## 2013-08-17 NOTE — Progress Notes (Signed)
  Echocardiogram 2D Echocardiogram has been performed.  Kristine Atkinson 08/17/2013, 3:21 PM

## 2013-08-18 ENCOUNTER — Encounter: Payer: Self-pay | Admitting: Family Medicine

## 2013-09-04 ENCOUNTER — Encounter: Payer: Self-pay | Admitting: Family Medicine

## 2013-09-04 ENCOUNTER — Ambulatory Visit (INDEPENDENT_AMBULATORY_CARE_PROVIDER_SITE_OTHER): Payer: No Typology Code available for payment source | Admitting: Family Medicine

## 2013-09-04 VITALS — BP 114/72 | HR 86 | Temp 98.0°F | Wt 195.0 lb

## 2013-09-04 DIAGNOSIS — H6123 Impacted cerumen, bilateral: Secondary | ICD-10-CM

## 2013-09-04 DIAGNOSIS — R55 Syncope and collapse: Secondary | ICD-10-CM

## 2013-09-04 DIAGNOSIS — E119 Type 2 diabetes mellitus without complications: Secondary | ICD-10-CM

## 2013-09-04 DIAGNOSIS — H918X9 Other specified hearing loss, unspecified ear: Secondary | ICD-10-CM

## 2013-09-04 DIAGNOSIS — F1410 Cocaine abuse, uncomplicated: Secondary | ICD-10-CM

## 2013-09-04 DIAGNOSIS — R059 Cough, unspecified: Secondary | ICD-10-CM

## 2013-09-04 DIAGNOSIS — I10 Essential (primary) hypertension: Secondary | ICD-10-CM

## 2013-09-04 DIAGNOSIS — R05 Cough: Secondary | ICD-10-CM

## 2013-09-04 DIAGNOSIS — I519 Heart disease, unspecified: Secondary | ICD-10-CM

## 2013-09-04 DIAGNOSIS — E785 Hyperlipidemia, unspecified: Secondary | ICD-10-CM

## 2013-09-04 DIAGNOSIS — I5189 Other ill-defined heart diseases: Secondary | ICD-10-CM

## 2013-09-04 DIAGNOSIS — H6120 Impacted cerumen, unspecified ear: Secondary | ICD-10-CM

## 2013-09-04 DIAGNOSIS — E1165 Type 2 diabetes mellitus with hyperglycemia: Secondary | ICD-10-CM

## 2013-09-04 MED ORDER — OLMESARTAN MEDOXOMIL 20 MG PO TABS: 20.0000 mg | ORAL_TABLET | Freq: Every day | ORAL | Status: DC

## 2013-09-04 MED ORDER — LISINOPRIL 40 MG PO TABS: 40.0000 mg | ORAL_TABLET | Freq: Every day | ORAL | Status: DC

## 2013-09-04 MED ORDER — LISINOPRIL 40 MG PO TABS
40.0000 mg | ORAL_TABLET | Freq: Every day | ORAL | Status: DC
Start: 2013-09-04 — End: 2013-09-04

## 2013-09-04 NOTE — Assessment & Plan Note (Signed)
Discussed diagnosis of diastolic dysfunction -patient on ACE-I (changing to ARB) -consider BB in future however not added today as BP well controlled

## 2013-09-04 NOTE — Assessment & Plan Note (Signed)
No further symptoms. Recent Echo identified diastolic dysfunction. -monitor clinically for further symptoms.

## 2013-09-04 NOTE — Assessment & Plan Note (Signed)
Persistent dry cough not relieved with albuterol -will change Lisinopril to Benicar -schedule patient for PFT's to evaluate lung function

## 2013-09-04 NOTE — Assessment & Plan Note (Signed)
Patient has bilateral cerumen impaction. Water Pic performed by nursing staff in office today.

## 2013-09-04 NOTE — Patient Instructions (Addendum)
It was nice to see you today.  High blood pressure - well controlled today, refill for Lisinopril sent to [facility] (this should be on the $4 list), continue current dose of HCTZ (after visit decided to stop Lisinopril due to cough, called in Benicar to [facility], patient notified)  Diastolic heart failure - your heart is not relaxing as well as it should, the best treatment for this is to control your blood pressure  Cholesterol - I would recommend that you are on a cholesterol lowering medication, if you change your mind and would like to start one please let me know  Kidney Function - your kidneys are working well  Please schedule an appointment with Dr. Valentina Lucks to have Pulmonary Function Testing (test your lung strength).  Schedule an appointment with Dr. Ree Kida in one month to follow up on your diabetes. I have referred you to diabetic education (you should receive a call to schedule an appointment). You do not need to check you blood sugars unless you feel that your sugar is low. I have also referred you to the ophthalmologist (eye doctor).  I have also referred you to [facility] (you will receive a call from their office).  [address] [place], [place] [address]

## 2013-09-04 NOTE — Assessment & Plan Note (Signed)
Blood pressure well controlled today -change Lisinopril to Benicar due to dry cough (Benicar covered through MAP program) -change was done after patient visit, she was called an notified

## 2013-09-04 NOTE — Assessment & Plan Note (Signed)
Discussed high ASCVD risk score with patient. She is hesitant to start Statin at this time. -patient to inform MD if she wishes to start statin

## 2013-09-04 NOTE — Assessment & Plan Note (Signed)
Recent UDS positive for cocaine once again -discussed stopping cocaine -referred to [facility] at her request -controlled substance contract completed in office today, will be scanned into chart

## 2013-09-04 NOTE — Progress Notes (Signed)
   Subjective:    Patient ID: Kristine Atkinson, female    DOB: April 19, 1955, 57 y.o.   MRN: [id number]  HPI 58 y/o female presents for routine follow up.   Syncope - patient reports no further syncopal episodes, has Echo cardiogram that showed diastolic dysfunction, no new chest pain/lighthededness/sob  HTN - reports compliance with Lisinopril and HCTZ, would like Lisinopril sent to a different pharmacy (has to pay $6 at [facility]), no chest pain, no vision changes  Dry cough - still reports dry cough,no relieved with albuterol  History of HLD - not currently on statin therapy  Cocaine Abuse - recent drug screen was once again positive for cocaine, patients denies use, states that she is "around people who do that"  Ear wax - reports hearing loss bilateral, wants ears checks, suspects ear wax, was given ear drops in the past however was unable to afford  Social - tobacco use, currently 3 cigs/day, previously up to 10 cigs/day, lives with husband  Patient requests referral to ophthalmology/diabetic education/[facility]. I was not able to fully evaluate her need for behavioral health referral today.    Review of Systems  Constitutional: Negative for fever, chills and fatigue.  Respiratory: Positive for cough. Negative for shortness of breath.   Cardiovascular: Negative for chest pain and leg swelling.  Gastrointestinal: Negative for nausea, vomiting, abdominal pain and diarrhea.       Objective:   Physical Exam Vitals: reviewed Gen: pleasant female, accompanied by husband HEENT: normocephalic, PERRL, EOMI, bilateral TM's obscured by cerumen, nasal septum midline, MMM, uvula midline Cardiac: RRR, S1 and S2 present, no murmurs, no heaves/thrills Resp: CTAB, normal effort Ext: no edema, 2+ radial pulses bilaterally   Echo - normal EF, grade I diastolic dysfunction Recent BMP wnl    Assessment & Plan:  Please see problem specific assessment and plan.

## 2013-09-09 NOTE — Addendum Note (Signed)
Addended by: Lupita Dawn on: 09/09/2013 02:46 PM   Modules accepted: Orders

## 2013-09-28 ENCOUNTER — Ambulatory Visit (INDEPENDENT_AMBULATORY_CARE_PROVIDER_SITE_OTHER): Payer: No Typology Code available for payment source | Admitting: Pharmacist

## 2013-09-28 ENCOUNTER — Encounter: Payer: Self-pay | Admitting: Pharmacist

## 2013-09-28 VITALS — BP 131/54 | HR 102 | Ht 62.0 in | Wt 193.6 lb

## 2013-09-28 DIAGNOSIS — R059 Cough, unspecified: Secondary | ICD-10-CM

## 2013-09-28 DIAGNOSIS — R05 Cough: Secondary | ICD-10-CM

## 2013-09-28 DIAGNOSIS — F17200 Nicotine dependence, unspecified, uncomplicated: Secondary | ICD-10-CM

## 2013-09-28 DIAGNOSIS — Z720 Tobacco use: Secondary | ICD-10-CM

## 2013-09-28 NOTE — Progress Notes (Signed)
S:    Patient arrives with husband. Presents for lung function evaluation.  Patient reports breathing has been difficult, feels short of breath. Answered questions with the assistance of her husband. Reports getting tired from the exam itself and endorses chronic fatigue. Reports unable to take walks due to her breathing difficulties, but can walk at least three blocks before having to stop to catch her breath. Patient is very distraught about her cough and not being able to sleep and became tearful after hearing results of the exam today. Patient feels we are withholding treatment that will help with her cough.   Age when started using tobacco on a daily basis 23. Number of Cigarettes per day 5-6. Most recent quit attempt 1 year ago Triggers to use tobacco include; stress/anger   O: CAT score= 36 See Documentation Flowsheet - CAT/COPD for complete symptom scoring.  See "scanned report" or Documentation Flowsheet (discrete results - PFTs) for  Spirometry results. Patient provided good effort while attempting spirometry.  Initial O2 sat prior to ambulation was 95% and post-ambulation was 90% Lung Age = 85 Albuterol Neb  Lot# [id number]    Exp. 4/17  A/P:  Spirometry evaluation reveals Moderate restrictive lung disease. Post nebulized albuterol tx revealed no significant change. Patient has been experiencing cough for 10 months and taking albuterol without relief. Frustrated and angry with chronic cough and does not feel tobacco cessation will help.  Provided information on 1 800-QUIT NOW support program. Reviewed results of pulmonary function tests.  Pt verbalized understanding of results and education.  Written pt instructions provided.  F/U Clinic visit with Dr. Ree Kida.  Moderate Nicotine Dependence of 40 years duration in a patient who is fair candidate for success b/c of triggers including stress and anger. Total time in face to face counseling 60 minutes.  Patient seen with Gloriajean Dell, PharmD  Candidate;  Fuller Canada,  PharmD Resident .

## 2013-09-28 NOTE — Assessment & Plan Note (Signed)
Spirometry evaluation reveals Moderate restrictive lung disease. Post nebulized albuterol tx revealed no significant change. Patient has been experiencing cough for 10 months and taking albuterol without relief. Frustrated and angry with chronic cough and does not feel tobacco cessation will help.  Provided information on 1 800-QUIT NOW support program. Reviewed results of pulmonary function tests.  Pt verbalized understanding of results and education.  Written pt instructions provided.  F/U Clinic visit with Dr. Ree Kida.  Moderate Nicotine Dependence of 40 years duration in a patient who is fair candidate for success b/c of triggers including stress and anger.

## 2013-09-28 NOTE — Patient Instructions (Addendum)
We performed a pulmonary lung function test today and revealed moderate lung restriction After albuterol treatment the pulmonary lung function test revealed similar results Consider use of Delsym for control of cough Follow-up with Dr. Ree Kida in 10 days as scheduled May contact 1-800-QUIT-NOW for counseling when trying to quit smoking

## 2013-09-28 NOTE — Assessment & Plan Note (Signed)
Mild to moderate tobacco use. Currently contemplative about quit attempt

## 2013-09-28 NOTE — Progress Notes (Signed)
Patient ID: Kristine Atkinson, female   DOB: 12-29-1955, 58 y.o.   MRN: [id number] Reviewed: Agree with Dr. Graylin Shiver documentation and management.

## 2013-10-02 ENCOUNTER — Other Ambulatory Visit: Payer: Self-pay | Admitting: *Deleted

## 2013-10-02 DIAGNOSIS — M797 Fibromyalgia: Secondary | ICD-10-CM

## 2013-10-04 MED ORDER — CYCLOBENZAPRINE HCL 10 MG PO TABS: 10.0000 mg | ORAL_TABLET | Freq: Three times a day (TID) | ORAL | Status: DC | PRN

## 2013-10-04 NOTE — Telephone Encounter (Signed)
Note to nursing staff - please call in Flexeril 10 TID PRN muscle spasm, dispense #30, refill #0, thanks

## 2013-10-09 ENCOUNTER — Other Ambulatory Visit: Payer: Self-pay | Admitting: *Deleted

## 2013-10-10 ENCOUNTER — Encounter: Payer: Self-pay | Admitting: Family Medicine

## 2013-10-10 ENCOUNTER — Ambulatory Visit (INDEPENDENT_AMBULATORY_CARE_PROVIDER_SITE_OTHER): Payer: No Typology Code available for payment source | Admitting: Family Medicine

## 2013-10-10 VITALS — BP 139/90 | HR 90 | Temp 97.7°F | Wt 195.0 lb

## 2013-10-10 DIAGNOSIS — R059 Cough, unspecified: Secondary | ICD-10-CM

## 2013-10-10 DIAGNOSIS — R05 Cough: Secondary | ICD-10-CM

## 2013-10-10 DIAGNOSIS — K219 Gastro-esophageal reflux disease without esophagitis: Secondary | ICD-10-CM

## 2013-10-10 DIAGNOSIS — J984 Other disorders of lung: Secondary | ICD-10-CM

## 2013-10-10 MED ORDER — ALBUTEROL SULFATE HFA 108 (90 BASE) MCG/ACT IN AERS: 2.0000 | INHALATION_SPRAY | Freq: Four times a day (QID) | RESPIRATORY_TRACT | Status: DC | PRN

## 2013-10-10 MED ORDER — E-Z SPACER DEVI: Status: DC

## 2013-10-10 MED ORDER — OLMESARTAN MEDOXOMIL 20 MG PO TABS: 20.0000 mg | ORAL_TABLET | Freq: Every day | ORAL | Status: DC

## 2013-10-10 NOTE — Assessment & Plan Note (Signed)
Multifactorial from tobacco use, restrictive lung disease, grade I diastolic dysfunction. Improved from recent visit for Spirometry. -continue albuterol at home as needed (patient counseled on correct use of albuterol) -prescription for spacer provided

## 2013-10-10 NOTE — Telephone Encounter (Signed)
Nursing staff - please call in olmesartan(Benicar) 20 mg 90 tablets, one refill, thanks

## 2013-10-10 NOTE — Assessment & Plan Note (Signed)
Moderate restrictive lung disease based on spirometry. -no improvement of LFT's with albuterol during spirometry however patient noticed a difference with albuterol nebulizer -has albuterol at home (patient counseled on correct usage during office visit), patient to use as needed

## 2013-10-10 NOTE — Progress Notes (Signed)
   Subjective:    Patient ID: Kristine Atkinson, female    DOB: 11-11-1955, 58 y.o.   MRN: [id number]  HPI 58 y/o female presents for follow up of cough and Spirometry  Cough/mild restrictive lung disease - patient recently had in office spirometry which identified mild restrictive lung disease, she reports that she has improvement of her cough after the albuterol nebulizer given in office at time of testing, she does have an albuterol inhaler but states that this does not help her cough  GERD - history of acid reflux, takes omeprazole daily, no recent heart burn  Tobacco abuse - she has cut down to one cigarette per day, she is motivated to quit as she has seen a decrease in her cough, declines pharmacotherapy   Review of Systems  Constitutional: Negative for fever, chills and fatigue.  Respiratory: Positive for cough. Negative for shortness of breath.   Cardiovascular: Negative for chest pain.       Objective:   Physical Exam Vitals: reviewed Gen: pleasant female, accompanied by husband Cardiac: RRR, S1 and S2 present, no murmurs, no heaves/thrills Resp: CTAB, normal effort     Assessment & Plan:  Please see problem specific assessment and plan.

## 2013-10-10 NOTE — Patient Instructions (Addendum)
I am glad to hear that your cough is improved.  I have prescribed you a spacer to use with your albuterol. Please use the albuterol as needed for cough.  Congratulations on cutting down on smoking, please attempt to quit completely. You have set a quit date for New Years.

## 2013-10-10 NOTE — Assessment & Plan Note (Signed)
Controlled currently with omeprazole daily.  - no changes to pharmacotherapy

## 2013-10-11 ENCOUNTER — Other Ambulatory Visit: Payer: Self-pay | Admitting: Family Medicine

## 2013-10-11 DIAGNOSIS — Z1231 Encounter for screening mammogram for malignant neoplasm of breast: Secondary | ICD-10-CM

## 2013-10-11 NOTE — Telephone Encounter (Signed)
Rx called in 

## 2013-10-23 ENCOUNTER — Ambulatory Visit (INDEPENDENT_AMBULATORY_CARE_PROVIDER_SITE_OTHER): Payer: Self-pay | Admitting: *Deleted

## 2013-10-23 DIAGNOSIS — Z23 Encounter for immunization: Secondary | ICD-10-CM

## 2013-11-08 ENCOUNTER — Other Ambulatory Visit: Payer: Self-pay | Admitting: *Deleted

## 2013-11-08 DIAGNOSIS — M797 Fibromyalgia: Secondary | ICD-10-CM

## 2013-11-09 MED ORDER — CYCLOBENZAPRINE HCL 10 MG PO TABS: 10.0000 mg | ORAL_TABLET | Freq: Three times a day (TID) | ORAL | Status: DC | PRN

## 2013-11-09 NOTE — Telephone Encounter (Signed)
Rx called in 

## 2013-11-09 NOTE — Telephone Encounter (Signed)
Note to nursing staff - please call in Flexeril 10 mg TID PRN muscle spasm, dispense #30, refill #0, thanks

## 2013-11-13 ENCOUNTER — Encounter: Payer: Self-pay | Admitting: Family Medicine

## 2013-11-20 ENCOUNTER — Ambulatory Visit (HOSPITAL_COMMUNITY)
Admission: RE | Admit: 2013-11-20 | Discharge: 2013-11-20 | Disposition: A | Discharge status: Home / Self Care | Payer: No Typology Code available for payment source | Source: Ambulatory Visit | Attending: Family Medicine | Admitting: Family Medicine

## 2013-11-20 DIAGNOSIS — Z1231 Encounter for screening mammogram for malignant neoplasm of breast: Secondary | ICD-10-CM

## 2013-11-24 ENCOUNTER — Other Ambulatory Visit: Payer: Self-pay | Admitting: Family Medicine

## 2013-11-28 ENCOUNTER — Other Ambulatory Visit: Payer: Self-pay | Admitting: Family Medicine

## 2013-11-28 ENCOUNTER — Ambulatory Visit: Payer: No Typology Code available for payment source

## 2013-11-28 DIAGNOSIS — M797 Fibromyalgia: Secondary | ICD-10-CM

## 2013-11-28 MED ORDER — PREGABALIN 225 MG PO CAPS: 225.0000 mg | ORAL_CAPSULE | Freq: Two times a day (BID) | ORAL | Status: DC

## 2013-11-28 NOTE — Telephone Encounter (Signed)
Patient requests refill for Lyrica 225 mg. Patient has [facility] as medication provider.

## 2014-01-19 ENCOUNTER — Telehealth: Payer: Self-pay | Admitting: *Deleted

## 2014-01-19 ENCOUNTER — Other Ambulatory Visit: Payer: Self-pay | Admitting: Family Medicine

## 2014-01-19 DIAGNOSIS — M797 Fibromyalgia: Secondary | ICD-10-CM

## 2014-01-19 MED ORDER — CYCLOBENZAPRINE HCL 10 MG PO TABS: 10.0000 mg | ORAL_TABLET | Freq: Three times a day (TID) | ORAL | Status: DC | PRN

## 2014-01-19 NOTE — Telephone Encounter (Signed)
Pt needs a refill on her lyrica, has been out for a week. [facility] uses [facility].

## 2014-01-19 NOTE — Telephone Encounter (Signed)
Order for Flexeril placed. Agree with follow up appointment. Will await forms from [place] to reorder Lyrica.

## 2014-01-19 NOTE — Telephone Encounter (Signed)
Sent in 3 month supply with 1 refill 7 weeks ago. Patient should not require refill.   Note to nursing staff - please call patient and inform her that she should not need a refill from [facility], she can call [facility] to get refill

## 2014-01-19 NOTE — Addendum Note (Signed)
Addended by: Lupita Dawn on: 01/19/2014 01:30 PM   Modules accepted: Orders

## 2014-01-19 NOTE — Telephone Encounter (Signed)
[employer] pathways form placed in MD box.

## 2014-01-19 NOTE — Telephone Encounter (Signed)
Spoke with patient and scheduled an appointment for her to see PCP on 2/2 at 11am, also called in rx for Flexeril to [facility] as written #30 with no refills per Dr. Ree Kida. FYI to PCP

## 2014-01-22 ENCOUNTER — Other Ambulatory Visit: Payer: Self-pay | Admitting: Family Medicine

## 2014-01-22 DIAGNOSIS — M797 Fibromyalgia: Secondary | ICD-10-CM

## 2014-01-22 MED ORDER — PREGABALIN 225 MG PO CAPS: 225.0000 mg | ORAL_CAPSULE | Freq: Two times a day (BID) | ORAL | Status: DC

## 2014-01-22 NOTE — Telephone Encounter (Signed)
Completed medication change request form for [employer]. Will fax prescription for 6 month supply of Lyrica and form to [employer].

## 2014-01-23 NOTE — Telephone Encounter (Signed)
Completed [employer] pathways form, faxed along with prescription for 6 month supply of Lyrica

## 2014-02-13 ENCOUNTER — Ambulatory Visit: Payer: No Typology Code available for payment source | Admitting: Family Medicine

## 2014-02-27 ENCOUNTER — Ambulatory Visit: Payer: No Typology Code available for payment source | Admitting: Family Medicine

## 2014-02-28 ENCOUNTER — Ambulatory Visit: Payer: No Typology Code available for payment source | Admitting: Family Medicine

## 2014-03-06 ENCOUNTER — Ambulatory Visit: Payer: No Typology Code available for payment source | Admitting: Family Medicine

## 2014-03-06 ENCOUNTER — Ambulatory Visit (INDEPENDENT_AMBULATORY_CARE_PROVIDER_SITE_OTHER): Payer: No Typology Code available for payment source | Admitting: Family Medicine

## 2014-03-06 ENCOUNTER — Encounter: Payer: Self-pay | Admitting: Family Medicine

## 2014-03-06 VITALS — BP 91/49 | HR 109 | Temp 97.6°F | Ht 62.0 in | Wt 187.0 lb

## 2014-03-06 DIAGNOSIS — E785 Hyperlipidemia, unspecified: Secondary | ICD-10-CM

## 2014-03-06 DIAGNOSIS — R938 Abnormal findings on diagnostic imaging of other specified body structures: Secondary | ICD-10-CM

## 2014-03-06 DIAGNOSIS — R9389 Abnormal findings on diagnostic imaging of other specified body structures: Secondary | ICD-10-CM

## 2014-03-06 DIAGNOSIS — K219 Gastro-esophageal reflux disease without esophagitis: Secondary | ICD-10-CM

## 2014-03-06 DIAGNOSIS — E119 Type 2 diabetes mellitus without complications: Secondary | ICD-10-CM

## 2014-03-06 DIAGNOSIS — I10 Essential (primary) hypertension: Secondary | ICD-10-CM

## 2014-03-06 LAB — POCT GLYCOSYLATED HEMOGLOBIN (HGB A1C): Hemoglobin A1C: 6.1

## 2014-03-06 MED ORDER — ATORVASTATIN CALCIUM 40 MG PO TABS: 40.0000 mg | ORAL_TABLET | Freq: Every day | ORAL | Status: DC

## 2014-03-06 MED ORDER — METFORMIN HCL 850 MG PO TABS: 850.0000 mg | ORAL_TABLET | Freq: Every day | ORAL | Status: DC

## 2014-03-06 NOTE — Assessment & Plan Note (Signed)
Patient reports noncompliance with Lipitor however would like to start taking again. -Refill provided -Check lipid profile in 6 months

## 2014-03-06 NOTE — Assessment & Plan Note (Signed)
Patient currently hypotensive however asymptomatic. Patient reports fluctuations of blood pressure when checking in the outpatient setting. -Will schedule for 24-hour blood pressure monitoring -In the meantime continue current blood pressure regimen however will likely need to titrate down current therapy

## 2014-03-06 NOTE — Progress Notes (Signed)
   Subjective:    Patient ID: CARYSSA ELZEY, female    DOB: 1955/04/03, 59 y.o.   MRN: [id number]  HPI 59 year old female presents for routine follow-up.  Non-insulin-dependent type 2 diabetes-patient currently on metformin 550 mg twice daily, no side effects, patient continues to have numbness in her feet related to peripheral neuropathy, takes Lyrica daily, she has started an exercise regimen has lost approximately 18 pounds, patient requests referral to podiatry, she has not seen a ophthalmologist since 2014  Hypertension-patient currently on HCTZ 25 mg daily and Benicar 20 mg daily, denies orthostatic hypotension, patient states that she has wide fluctuations of her blood pressures when checked outside the office, no current chest pain, no current vision changes, no current headache  Hyperlipidemia-patient requests refill for Lipitor, this was previously prescribed for her however she was resistant to starting this medication, she has since decided to start taking this  Acid reflux-patient reports control of symptoms with daily Prilosec  Social-current smoker, lives with husband   Review of Systems  Constitutional: Negative for fever, chills and fatigue.  Respiratory: Negative for cough and shortness of breath.   Cardiovascular: Negative for chest pain.  Gastrointestinal: Negative for nausea, vomiting and diarrhea.       Objective:   Physical Exam Vitals: Reviewed next line  Gen.: Pleasant African-American female, no acute distress, accompanied by her husband HEENT: Normocephalic, pupils equal round and reactive to light, extraocular movements are intact, no scleral icterus, moist mucous membranes Cardiac: Regular rate and rhythm, S1 and S2 present, no murmurs, no heaves or thrills Respiratory: Clear to station bilaterally, normal effort Abdomen: Soft, nontender, bowel sounds present Extremities: No edema Skin: Equally Metrix for foot examination  Point-of-care A1c  6.1 Reviewed lab work from past 12 months     Assessment & Plan:  Please see problem specific assessment and plan.

## 2014-03-06 NOTE — Assessment & Plan Note (Signed)
Per patient she was seen and worked up by [employer].

## 2014-03-06 NOTE — Assessment & Plan Note (Signed)
Type 2 diabetes is well controlled. Current A1c is 6.1. -Patient counseled to decrease metformin to 850 mg once daily -Referral to podiatry made -Number for ophthalmology provided -Encourage continued lifestyle modifications

## 2014-03-06 NOTE — Patient Instructions (Addendum)
High Blood Pressure - please make an appointment with Dr. Valentina Lucks for 25 hour blood pressure monitor. For the time being please continue HCTZ and Benicar.  Diabetes - well controlled, please decrease Metformin 850 mg  once daily, a referral to podiatry has been placed, please make an appointment with an eye doctor (I recommend the office of Dr. Katy Fitch or Dr. Gershon Crane) [phone]  Cholesterol - prescription for Lipitor has been sent to the [facility] program  Acid Reflux - continue omeprazole  Cholesterol Cholesterol is a white, waxy, fat-like substance needed by your body in small amounts. The liver makes all the cholesterol you need. Cholesterol is carried from the liver by the blood through the blood vessels. Deposits of cholesterol (plaque) may build up on blood vessel walls. These make the arteries narrower and stiffer. Cholesterol plaques increase the risk for heart attack and stroke.  You cannot feel your cholesterol level even if it is very high. The only way to know it is high is with a blood test. Once you know your cholesterol levels, you should keep a record of the test results. Work with your health care provider to keep your levels in the desired range.  WHAT DO THE RESULTS MEAN?  Total cholesterol is a rough measure of all the cholesterol in your blood.   LDL is the so-called bad cholesterol. This is the type that deposits cholesterol in the walls of the arteries. You want this level to be low.   HDL is the good cholesterol because it cleans the arteries and carries the LDL away. You want this level to be high.  Triglycerides are fat that the body can either burn for energy or store. High levels are closely linked to heart disease.  WHAT ARE THE DESIRED LEVELS OF CHOLESTEROL?  Total cholesterol below 200.   LDL below 100 for people at risk, below 70 for those at very high risk.   HDL above 50 is good, above 60 is best.   Triglycerides below 150.  HOW CAN I LOWER MY  CHOLESTEROL?  Diet. Follow your diet programs as directed by your health care provider.   Choose fish or white meat chicken and [place], roasted or baked. Limit fatty cuts of red meat, fried foods, and processed meats, such as sausage and lunch meats.   Eat lots of fresh fruits and vegetables.  Choose whole grains, beans, pasta, potatoes, and cereals.   Use only small amounts of olive, corn, or canola oils.   Avoid butter, mayonnaise, shortening, or palm kernel oils.  Avoid foods with trans fats.   Drink skim or nonfat milk and eat low-fat or nonfat yogurt and cheeses. Avoid whole milk, cream, ice cream, egg yolks, and full-fat cheeses.   Healthy desserts include angel food cake, ginger snaps, animal crackers, hard candy, popsicles, and low-fat or nonfat frozen yogurt. Avoid pastries, cakes, pies, and cookies.   Exercise. Follow your exercise programs as directed by your health care provider.   A regular program helps decrease LDL and raise HDL.   A regular program helps with weight control.   Do things that increase your activity level like gardening, walking, or taking the stairs. Ask your health care provider about how you can be more active in your daily life.   Medicine. Take medicine only as directed by your health care provider.   Medicine may be prescribed by your health care provider to help lower cholesterol and decrease the risk for heart disease.   If you have  several risk factors, you may need medicine even if your levels are normal. Document Released: 09/23/2000 Document Revised: 05/15/2013 Document Reviewed: 10/12/2012 [facility] Patient Information 2015 [place], [place]. This information is not intended to replace advice given to you by your health care provider. Make sure you discuss any questions you have with your health care provider.

## 2014-03-06 NOTE — Assessment & Plan Note (Signed)
Controlled with daily Prilosec.

## 2014-03-08 ENCOUNTER — Encounter: Payer: Self-pay | Admitting: Pharmacist

## 2014-03-08 ENCOUNTER — Ambulatory Visit (INDEPENDENT_AMBULATORY_CARE_PROVIDER_SITE_OTHER): Payer: No Typology Code available for payment source | Admitting: Pharmacist

## 2014-03-08 VITALS — BP 137/76 | HR 93 | Ht 62.0 in | Wt 188.0 lb

## 2014-03-08 DIAGNOSIS — Z720 Tobacco use: Secondary | ICD-10-CM

## 2014-03-08 DIAGNOSIS — I10 Essential (primary) hypertension: Secondary | ICD-10-CM

## 2014-03-08 DIAGNOSIS — E785 Hyperlipidemia, unspecified: Secondary | ICD-10-CM

## 2014-03-08 NOTE — Patient Instructions (Addendum)
Thank you for taking your blood pressure readings.   Please change your Olmesartan to 4 in the afternoon.   Please start carvedilol 3.125mg  twice daily. (4 in the morning and 4 in the afternoon)  This new medicine should be available at [facility].   Take your fluid pill hydrochlorothiazide (HCTZ) - at 4 in the afternoon.    4 AM medications:   Carvedilol, Metformin, Hydrochlorothiazide, Lyrica   4 PM medications:   Carvedilol,  Olmesartan, Lipitor, Lyrica  Continue all other medications as you have taken previously.   Next follow up with Dr. Ree Kida in 1 month.

## 2014-03-08 NOTE — Progress Notes (Signed)
S:    Patient arrives to pharmacy clinic for ambulatory blood pressure evaluation with her husband, Gwyndolyn Saxon.  Diagnosed with Hypertension 1 1/2 years ago.   Medication compliance is reported to be "good".  Discussed procedure for wearing the monitor and gave patient written instructions. Monitor was placed on non-dominant arm with instructions to return in the morning.   Current BP Medications include: Olmesartan 20mg  daily, HCTZ 12.5mg  daily  Patient currently reports smoking about 3 cigarettes a day. The most she has smoked in the past has been 6-7 cigarettes a day. Congratulated patient on cutting cigarette use in half. Reports no other smokers in the house and readiness to cut back even more on her cigarette use.  Patient returned to the clinic and reported that she did not sleep well last night due to the BP cuff.  She did wear the monitor while exercising without issue.   O:   Last 3 Office BP readings: BP Readings from Last 3 Encounters:  03/08/14 137/76  03/06/14 91/49  10/10/13 139/90    Today's Office BP reading:137/76 mmHg   ABPM Study Data: Arm Placement right arm   Overall Mean 24hr BP:   137/77 mmHg HR: 103   Daytime Mean BP:  137/78 mmHg HR: 102   Nighttime Mean BP:  140/72 mmHg HR: 106   Dipping Pattern: No.  Sys:  -1.9%   Dia: 8.2%   [normal dipping ~10-20%]   Non-hypertensive ABPM thresholds: daytime BP <135/85 mmHg, sleeptime BP <120/70 mmHg NICE Hypertension Guidelines ([place]) using ABPM: Stage I: >135/85 mmHg, Stage 2: >150/95 mmHg)   BMET    Component Value Date/Time   NA 138 08/14/2013 1114   K 4.6 08/14/2013 1114   CL 103 08/14/2013 1114   CO2 26 08/14/2013 1114   GLUCOSE 81 08/14/2013 1114   BUN 15 08/14/2013 1114   CREATININE 0.93 08/14/2013 1114   CREATININE 1.89* 08/03/2013 1403   CALCIUM 9.1 08/14/2013 1114   GFRNONAA 29* 08/03/2013 1403   GFRAA 33* 08/03/2013 1403    A/P: History of hypertension diagnosed within the last 2 years.  Given 24-hour ambulatory blood pressure demonstrates mild hypertension with an average blood pressure of 139/78 mmHg, and a lack of nocturnal dipping. However, patient reports that she did not sleep well last night. Pulse was elevated above 100 throughout the 24 hour period. Patient is concerned about her pulse and wants to lower it. Encouraged that smoking cessation will help to lower pulse. Changes to medications include: addition of carvedilol 3.125mg  BID and shifting olmesartan to evening to help force dipping pattern.  Following extensive counseling on medication timing patient verbalized understanding of new regimen.    Smoking cessation: patient currently smoking 3 cigarettes a day, down from 6-7 per day. Patient willing to cut back on smoking even more, educated that this would help to reduce her pulse as well. Follow up at next visit.  Results reviewed and written information provided.   F/U Clinic Visit with Dr. Ree Kida in 1 month.  Total time in face-to-face counseling 60 minutes.  Patient seen with Fuller Canada, PharmD resident.

## 2014-03-09 ENCOUNTER — Encounter: Payer: Self-pay | Admitting: Pharmacist

## 2014-03-09 MED ORDER — CARVEDILOL 3.125 MG PO TABS: 3.1250 mg | ORAL_TABLET | Freq: Two times a day (BID) | ORAL | Status: DC

## 2014-03-09 NOTE — Progress Notes (Signed)
Patient ID: Kristine Atkinson, female   DOB: 1955-07-31, 59 y.o.   MRN: [id number] Reviewed: Agree with Dr. Graylin Shiver documentation and management.

## 2014-03-09 NOTE — Assessment & Plan Note (Signed)
  Tobacco Abuse longstanding and currently smoking 3 cigarettes a day, down from 6-7 per day. Patient willing to cut back on smoking even more, educated that this would help to reduce her pulse as well. Follow up at next visit.

## 2014-03-09 NOTE — Assessment & Plan Note (Signed)
f °

## 2014-03-21 ENCOUNTER — Encounter: Payer: Self-pay | Admitting: Podiatry

## 2014-03-21 ENCOUNTER — Ambulatory Visit: Payer: No Typology Code available for payment source | Admitting: Podiatry

## 2014-03-21 VITALS — BP 105/64 | HR 86 | Resp 12

## 2014-03-21 DIAGNOSIS — L84 Corns and callosities: Secondary | ICD-10-CM

## 2014-03-21 DIAGNOSIS — E0842 Diabetes mellitus due to underlying condition with diabetic polyneuropathy: Secondary | ICD-10-CM

## 2014-03-21 NOTE — Patient Instructions (Signed)
Wear a gelcap toe sleeves on right and left big toes daily if they are comfortable Replace your walking shoes After showering use a pumice stone or callus file to gently stand calluses on the edges of the big toes  Diabetes and Foot Care Diabetes may cause you to have problems because of poor blood supply (circulation) to your feet and legs. This may cause the skin on your feet to become thinner, break easier, and heal more slowly. Your skin may become dry, and the skin may peel and crack. You may also have nerve damage in your legs and feet causing decreased feeling in them. You may not notice minor injuries to your feet that could lead to infections or more serious problems. Taking care of your feet is one of the most important things you can do for yourself.  HOME CARE INSTRUCTIONS  Wear shoes at all times, even in the house. Do not go barefoot. Bare feet are easily injured.  Check your feet daily for blisters, cuts, and redness. If you cannot see the bottom of your feet, use a mirror or ask someone for help.  Wash your feet with warm water (do not use hot water) and mild soap. Then pat your feet and the areas between your toes until they are completely dry. Do not soak your feet as this can dry your skin.  Apply a moisturizing lotion or petroleum jelly (that does not contain alcohol and is unscented) to the skin on your feet and to dry, brittle toenails. Do not apply lotion between your toes.  Trim your toenails straight across. Do not dig under them or around the cuticle. File the edges of your nails with an emery board or nail file.  Do not cut corns or calluses or try to remove them with medicine.  Wear clean socks or stockings every day. Make sure they are not too tight. Do not wear knee-high stockings since they may decrease blood flow to your legs.  Wear shoes that fit properly and have enough cushioning. To break in new shoes, wear them for just a few hours a day. This prevents you  from injuring your feet. Always look in your shoes before you put them on to be sure there are no objects inside.  Do not cross your legs. This may decrease the blood flow to your feet.  If you find a minor scrape, cut, or break in the skin on your feet, keep it and the skin around it clean and dry. These areas may be cleansed with mild soap and water. Do not cleanse the area with peroxide, alcohol, or iodine.  When you remove an adhesive bandage, be sure not to damage the skin around it.  If you have a wound, look at it several times a day to make sure it is healing.  Do not use heating pads or hot water bottles. They may burn your skin. If you have lost feeling in your feet or legs, you may not know it is happening until it is too late.  Make sure your health care provider performs a complete foot exam at least annually or more often if you have foot problems. Report any cuts, sores, or bruises to your health care provider immediately. SEEK MEDICAL CARE IF:   You have an injury that is not healing.  You have cuts or breaks in the skin.  You have an ingrown nail.  You notice redness on your legs or feet.  You feel burning or  tingling in your legs or feet.  You have pain or cramps in your legs and feet.  Your legs or feet are numb.  Your feet always feel cold. SEEK IMMEDIATE MEDICAL CARE IF:   There is increasing redness, swelling, or pain in or around a wound.  There is a red line that goes up your leg.  Pus is coming from a wound.  You develop a fever or as directed by your health care provider.  You notice a bad smell coming from an ulcer or wound. Document Released: 12/27/1999 Document Revised: 08/31/2012 Document Reviewed: 06/07/2012 [facility] Patient Information 2015 [place], [place]. This information is not intended to replace advice given to you by your health care provider. Make sure you discuss any questions you have with your health care provider.

## 2014-03-21 NOTE — Progress Notes (Signed)
   Subjective:    Patient ID: Kristine Atkinson, female    DOB: June 23, 1955, 59 y.o.   MRN: [id number]  HPI  N-SORE L-B/L GREAT TOENAIL D-1 MONTH C-WORSE AT NIGHT O-SLOWLY A-PRESSURE T-SOAK WITH BETADINE   N-THICK, SORE L-B/L GREAT TOE/BALL OF THGE FOOT D-1 YEAR C-THICKER O-SLOWLY A-PRESSURE T-SOAK  Patient denies any history of foot ulceration or claudication History of abscess in the fourth right toe Patient is able to walk 2 miles at a time  Review of Systems  HENT: Positive for sinus pressure.   Musculoskeletal: Positive for myalgias and back pain.  Skin: Positive for rash.  Allergic/Immunologic: Positive for environmental allergies.  Neurological: Positive for weakness, numbness and headaches.  Hematological: Bruises/bleeds easily.  All other systems reviewed and are negative.      Objective:   Physical Exam  Orientated 3 patient presents with her husband in the treatment room today  Vascular: DP pulses 2/4 bilaterally PT pulses 2/4 bilaterally Capillary reflex immediate bilaterally  Neurological: Ankle reflexes equal and reactive bilaterally Vibratory sensation diminished or nonreactive bilaterally (patient somewhat confused and responding is ( Sensation to 10 g monofilament wire intact 3/5 right and 4/5 left  Dermatological: Texture and turgor within normal limits Keratoses medial hallux bilaterally Plantar keratoses fifth MPJ bilaterally Small keratoses dorsal fourth right toe The hallux medial toenail margins are slightly tender to direct palpation, without any erythema, edema, drainage. The margins are not obviously incurvated.      Assessment & Plan:   Assessment: Satisfactory vascular status Diabetic peripheral neuropathy Minimal callus right and left that needs no debridement Hallux nail margins may be tender just from direct shoe pressure, without any clinical sign of infection or deformity  Plan: Discuss results of diabetic foot  exam with patient today Dispensed to gelcap covers to apply to the right and left hallux toes to reduce pressure on the hallux nail margins I also recommended the use of pumice stone or callus file to gently smoothly plantar calluses on the right and left feet after showering  Reappoint yearly for diabetic foot exam or sooner if patient has concern

## 2014-03-26 ENCOUNTER — Ambulatory Visit: Payer: Self-pay

## 2014-03-26 ENCOUNTER — Ambulatory Visit: Payer: No Typology Code available for payment source

## 2014-04-04 ENCOUNTER — Telehealth: Payer: Self-pay | Admitting: *Deleted

## 2014-04-04 MED ORDER — CYCLOBENZAPRINE HCL 10 MG PO TABS: 10.0000 mg | ORAL_TABLET | Freq: Three times a day (TID) | ORAL | Status: DC | PRN

## 2014-04-04 NOTE — Telephone Encounter (Signed)
Received fax from [facility].  Patient states she is in pain and requesting refill of cyclobenzaprine 10 mg #30 tabs--take 1 tab TID prn muscle spasm.  Will route request to Dr. Ree Kida.  Burna Forts, BSN, RN-BC

## 2014-04-04 NOTE — Telephone Encounter (Signed)
I a covering for Dr. Ree Kida in his absence and will provide 1 month's worth.

## 2014-04-11 ENCOUNTER — Other Ambulatory Visit: Payer: Self-pay | Admitting: *Deleted

## 2014-04-11 DIAGNOSIS — I10 Essential (primary) hypertension: Secondary | ICD-10-CM

## 2014-04-11 MED ORDER — OLMESARTAN MEDOXOMIL 20 MG PO TABS: 20.0000 mg | ORAL_TABLET | Freq: Every day | ORAL | Status: DC

## 2014-04-11 NOTE — Telephone Encounter (Signed)
Dawn from [facility] called to request refill for Benicar.  Last dispense 01/10/14.  Pt is out of medication.  Derl Barrow, RN

## 2014-05-24 ENCOUNTER — Encounter (HOSPITAL_COMMUNITY): Payer: Self-pay | Admitting: Emergency Medicine

## 2014-05-24 ENCOUNTER — Emergency Department (INDEPENDENT_AMBULATORY_CARE_PROVIDER_SITE_OTHER)
Admission: EM | Admit: 2014-05-24 | Discharge: 2014-05-24 | Disposition: A | Discharge status: Home / Self Care | Payer: No Typology Code available for payment source | Source: Home / Self Care | Attending: Family Medicine | Admitting: Family Medicine

## 2014-05-24 DIAGNOSIS — T148XXA Other injury of unspecified body region, initial encounter: Secondary | ICD-10-CM

## 2014-05-24 DIAGNOSIS — T148 Other injury of unspecified body region: Secondary | ICD-10-CM

## 2014-05-24 NOTE — Discharge Instructions (Signed)
Thank you for coming in today. Return as needed.    Contusion A contusion is a deep bruise. Contusions happen when an injury causes bleeding under the skin. Signs of bruising include pain, puffiness (swelling), and discolored skin. The contusion may turn blue, purple, or yellow. HOME CARE   Put ice on the injured area.  Put ice in a plastic bag.  Place a towel between your skin and the bag.  Leave the ice on for 15-20 minutes, 03-04 times a day.  Only take medicine as told by your doctor.  Rest the injured area.  If possible, raise (elevate) the injured area to lessen puffiness. GET HELP RIGHT AWAY IF:   You have more bruising or puffiness.  You have pain that is getting worse.  Your puffiness or pain is not helped by medicine. MAKE SURE YOU:   Understand these instructions.  Will watch your condition.  Will get help right away if you are not doing well or get worse. Document Released: 06/17/2007 Document Revised: 03/23/2011 Document Reviewed: 11/03/2010 [facility] Patient Information 2015 [place], [place]. This information is not intended to replace advice given to you by your health care provider. Make sure you discuss any questions you have with your health care provider.

## 2014-05-24 NOTE — ED Provider Notes (Signed)
Kristine Atkinson is a 59 y.o. female who presents to Urgent Care today for right upper arm contusion. Patient fell at home 5 days ago and has a slight bruise on her upper arm. She donates plasma regularly and notes that she will need a note to be able to donate plasma tomorrow. She feels well. No symptoms no radiating pain weakness or numbness fevers or chills.   Past Medical History  Diagnosis Date  . Chronic pain   . Diabetes mellitus   . Hypertension   . Fibromyalgia   . Cocaine abuse 09/09/2012    Cocaine positive on UDS 09/09/2012.    Marland Kitchen Hyperlipidemia    Past Surgical History  Procedure Laterality Date  . Oophorectomy      ectopic   History  Substance Use Topics  . Smoking status: Current Every Day Smoker -- 0.20 packs/day for 41 years    Types: Cigarettes    Start date: 01/12/1973  . Smokeless tobacco: Never Used     Comment: smokes about 3-4 cigs per day  as of  02/2014  . Alcohol Use: No   ROS as above Medications: No current facility-administered medications for this encounter.   Current Outpatient Prescriptions  Medication Sig Dispense Refill  . atorvastatin (LIPITOR) 40 MG tablet Take 1 tablet (40 mg total) by mouth daily. 90 tablet 1  . carvedilol (COREG) 3.125 MG tablet Take 1 tablet (3.125 mg total) by mouth 2 (two) times daily. 60 tablet 3  . cyclobenzaprine (FLEXERIL) 10 MG tablet Take 1 tablet (10 mg total) by mouth 3 (three) times daily as needed for muscle spasms. 30 tablet 0  . hydrochlorothiazide (HYDRODIURIL) 25 MG tablet Take 0.5 tablets (12.5 mg total) by mouth daily. 90 tablet 1  . ibuprofen (ADVIL,MOTRIN) 200 MG tablet Take 200 mg by mouth every 6 (six) hours as needed.    . metFORMIN (GLUCOPHAGE) 850 MG tablet Take 1 tablet (850 mg total) by mouth daily with breakfast. 180 tablet 1  . olmesartan (BENICAR) 20 MG tablet Take 1 tablet (20 mg total) by mouth at bedtime. 90 tablet 1  . omeprazole (PRILOSEC) 20 MG capsule Take 1 capsule (20 mg total) by  mouth 2 (two) times daily. 60 capsule 3  . pregabalin (LYRICA) 225 MG capsule Take 1 capsule (225 mg total) by mouth 2 (two) times daily. 180 capsule 1  . Spacer/Aero-Holding Chambers (E-Z SPACER) inhaler Use as instructed 1 each 0   Allergies  Allergen Reactions  . Penicillins Other (See Comments)    Itching     Exam:  BP 113/48 mmHg  Pulse 90  Temp(Src) 98.2 F (36.8 C) (Oral)  Resp 16  SpO2 97% Gen: Well NAD HEENT: EOMI,  MMM Lungs: Normal work of breathing. CTABL Heart: RRR no MRG Abd: NABS, Soft. Nondistended, Nontender Exts: Brisk capillary refill, warm and well perfused.  Skin: Slight hematoma and very small abrasion of the upper arm.  Arm is nontender with normal motion As is capillary refill sensation are intact right upper extremity  No results found for this or any previous visit (from the past 24 hour(s)). No results found.  Assessment and Plan: 59 y.o. female with contusion. Okay to donate plasma. Return as needed.  Discussed warning signs or symptoms. Please see discharge instructions. Patient expresses understanding.     Gregor Hams, MD 05/24/14 1101

## 2014-05-24 NOTE — ED Notes (Signed)
C/o falling and hurting arm See physician note

## 2014-05-29 ENCOUNTER — Encounter: Payer: Self-pay | Admitting: Family Medicine

## 2014-05-29 ENCOUNTER — Ambulatory Visit (INDEPENDENT_AMBULATORY_CARE_PROVIDER_SITE_OTHER): Payer: Self-pay | Admitting: Family Medicine

## 2014-05-29 ENCOUNTER — Ambulatory Visit: Payer: No Typology Code available for payment source

## 2014-05-29 VITALS — BP 112/72 | HR 88 | Temp 97.9°F | Ht 62.0 in | Wt 189.2 lb

## 2014-05-29 DIAGNOSIS — M797 Fibromyalgia: Secondary | ICD-10-CM

## 2014-05-29 DIAGNOSIS — K219 Gastro-esophageal reflux disease without esophagitis: Secondary | ICD-10-CM

## 2014-05-29 DIAGNOSIS — M7062 Trochanteric bursitis, left hip: Secondary | ICD-10-CM

## 2014-05-29 DIAGNOSIS — I10 Essential (primary) hypertension: Secondary | ICD-10-CM

## 2014-05-29 DIAGNOSIS — M25562 Pain in left knee: Secondary | ICD-10-CM

## 2014-05-29 HISTORY — DX: Trochanteric bursitis, left hip: M70.62

## 2014-05-29 MED ORDER — CYCLOBENZAPRINE HCL 10 MG PO TABS: 10.0000 mg | ORAL_TABLET | Freq: Three times a day (TID) | ORAL | Status: DC | PRN

## 2014-05-29 MED ORDER — OMEPRAZOLE 20 MG PO CPDR: 20.0000 mg | DELAYED_RELEASE_CAPSULE | Freq: Two times a day (BID) | ORAL | Status: DC

## 2014-05-29 MED ORDER — METFORMIN HCL 850 MG PO TABS: 850.0000 mg | ORAL_TABLET | Freq: Every day | ORAL | Status: DC

## 2014-05-29 MED ORDER — METHYLPREDNISOLONE ACETATE 40 MG/ML IJ SUSP
40.0000 mg | Freq: Once | INTRAMUSCULAR | Status: AC
  Administered 2014-05-29: 40 mg via INTRA_ARTICULAR

## 2014-05-29 MED ORDER — HYDROCHLOROTHIAZIDE 25 MG PO TABS: 12.5000 mg | ORAL_TABLET | Freq: Every day | ORAL | Status: DC

## 2014-05-29 NOTE — Assessment & Plan Note (Signed)
Controlled on Coreg/HCTZ/Benicar -refills provided

## 2014-05-29 NOTE — Assessment & Plan Note (Signed)
Stable on omeprazole -refill provided

## 2014-05-29 NOTE — Assessment & Plan Note (Signed)
Patient requests refill of Lyrica. Last prescription was in January 2016 for 6 month supply. Spoke with [facility], patient has one refill remaining and they will send the medication. Patients enrollment is expiring in July, patient needs to reapply I informed the patient and her husband of the above

## 2014-05-29 NOTE — Patient Instructions (Addendum)
Hydrochlorothiazide (for blood pressure): printed prescription for refill Cyclobenzaprine (for back spasm): printed prescription for refill  Carvedilol (for your pulse): you have 2 refills left, so take it to the pharmacy for a refill Metformin (for diabetes): printed prescription for refill Omeprazole (for acid reflux): you have 1 refill left, so take it to the pharmacy for a refill. We also printed a prescription you can take to the pharmacy.  Olmesartan (for blood pressure): you have 1 refill left,so take it to the pharmacy for a refill  You have Trochanteric Bursitis in your left hip. We will inject a steroid into that space. Please let Dr. Ree Kida know if you do not have pain relief in 1-2 week.

## 2014-05-29 NOTE — Assessment & Plan Note (Signed)
Patient presents for evaluation of left lateral hip pain consistent with greater trochanteric bursitis. -steroid injection performed (see procedure note).

## 2014-05-29 NOTE — Progress Notes (Signed)
   Subjective:    Patient ID: Kristine Atkinson, female    DOB: 1955-09-22, 59 y.o.   MRN: [id number]  HPI  59 y/o female presents for medication refill and evaluation of left leg pain  Left leg pain - left lateral leg pain, started 3 months ago, progressively worse, now 10/10, burning/stabbing pain with associated numbness, reports toe tingling in the left lower extremity (not a new symptom), no associated injury, no associated back or groin pain.   HTN - taking Coreg, HCTZ, and olmesartan, reports compliance, needs refills, no chest pain or dizziness  GERD - patient reports good control of symptoms on omeprazole, no current abdominal pain/nausea/emesis.   Social - lives with husband, current smoker  Review of Systems  Constitutional: Negative for fever and chills.  Respiratory: Negative for chest tightness and shortness of breath.   Gastrointestinal: Negative for nausea, vomiting, abdominal pain and diarrhea.       Objective:   Physical Exam Vitals: reviewed Gen: pleasant female, NAD, accompanied by husband HEENT: normocephalic, PERRL, EOMI, no scleral icterus, MMM, uvula midline Cardiac: RRR, S1 and S2 present, no murmur, no heaves/thrills Resp: CTAB, normal effort MSK: tenderness to palpation over lateral left hip/leg (over greater trochanter), no back tenderness Skin: no rash, no cyanosis, no bruising Neuro: strength 5/5 to hip flexion and knee flexion/extension  Procedure Note:  Greater Trochanteric Bursa Injection. Written consent obtained, discussed risks and benefits, point of maximal tenderness over left greater trochanter identified, area prepped in sterile fashion, 40 mg of DepoMedrol and 5 cc of 1% Lidocaine without epi injected into the area, patient tolerated well, no bleeding, bandaid applied.      Assessment & Plan:  Please see problem specific assessment and plan.

## 2014-06-18 ENCOUNTER — Telehealth: Payer: Self-pay | Admitting: Family Medicine

## 2014-06-18 DIAGNOSIS — M797 Fibromyalgia: Secondary | ICD-10-CM

## 2014-06-18 MED ORDER — PREGABALIN 225 MG PO CAPS: 225.0000 mg | ORAL_CAPSULE | Freq: Two times a day (BID) | ORAL | Status: DC

## 2014-06-18 NOTE — Telephone Encounter (Signed)
Completed yearly enrollment form for [facility] (for Lyrica), hard copy of Lyrica printed (3 month supply + 1 refill).   Nursing staff - please call patient for pickup, placed in front office

## 2014-06-18 NOTE — Telephone Encounter (Signed)
Patient informed that paperwork and script was ready to be picked up, expressed understanding.

## 2014-06-18 NOTE — Telephone Encounter (Signed)
Separate medication refill encounter created, completed [employer] pathways form, script for Lyrica printed, placed Rx and form in front office for patient pickup, instructions to nurse in other note to call patient for pickup

## 2014-06-18 NOTE — Telephone Encounter (Signed)
Clinical portion done, placed in MD box for completion.

## 2014-08-15 ENCOUNTER — Other Ambulatory Visit: Payer: Self-pay | Admitting: *Deleted

## 2014-08-15 DIAGNOSIS — I10 Essential (primary) hypertension: Secondary | ICD-10-CM

## 2014-08-15 MED ORDER — CARVEDILOL 3.125 MG PO TABS: 3.1250 mg | ORAL_TABLET | Freq: Two times a day (BID) | ORAL | Status: DC

## 2014-08-20 ENCOUNTER — Ambulatory Visit: Payer: No Typology Code available for payment source | Admitting: Family Medicine

## 2014-08-27 ENCOUNTER — Ambulatory Visit (INDEPENDENT_AMBULATORY_CARE_PROVIDER_SITE_OTHER): Payer: No Typology Code available for payment source | Admitting: Family Medicine

## 2014-08-27 ENCOUNTER — Encounter: Payer: Self-pay | Admitting: Family Medicine

## 2014-08-27 VITALS — BP 154/77 | HR 86 | Temp 98.3°F | Wt 181.0 lb

## 2014-08-27 DIAGNOSIS — I10 Essential (primary) hypertension: Secondary | ICD-10-CM

## 2014-08-27 DIAGNOSIS — E119 Type 2 diabetes mellitus without complications: Secondary | ICD-10-CM

## 2014-08-27 DIAGNOSIS — Z0000 Encounter for general adult medical examination without abnormal findings: Secondary | ICD-10-CM

## 2014-08-27 DIAGNOSIS — K0889 Other specified disorders of teeth and supporting structures: Secondary | ICD-10-CM | POA: Insufficient documentation

## 2014-08-27 DIAGNOSIS — E785 Hyperlipidemia, unspecified: Secondary | ICD-10-CM

## 2014-08-27 DIAGNOSIS — K088 Other specified disorders of teeth and supporting structures: Secondary | ICD-10-CM

## 2014-08-27 LAB — COMPREHENSIVE METABOLIC PANEL
ALBUMIN: 3.3 g/dL — AB (ref 3.6–5.1)
ALT: 10 U/L (ref 6–29)
AST: 12 U/L (ref 10–35)
Alkaline Phosphatase: 50 U/L (ref 33–130)
BILIRUBIN TOTAL: 0.2 mg/dL (ref 0.2–1.2)
BUN: 12 mg/dL (ref 7–25)
CHLORIDE: 107 mmol/L (ref 98–110)
CO2: 26 mmol/L (ref 20–31)
CREATININE: 1.07 mg/dL — AB (ref 0.50–1.05)
Calcium: 8.9 mg/dL (ref 8.6–10.4)
GLUCOSE: 77 mg/dL (ref 65–99)
Potassium: 3.9 mmol/L (ref 3.5–5.3)
SODIUM: 144 mmol/L (ref 135–146)
Total Protein: 5.3 g/dL — ABNORMAL LOW (ref 6.1–8.1)

## 2014-08-27 LAB — CBC
HEMATOCRIT: 43.9 % (ref 36.0–46.0)
Hemoglobin: 14.3 g/dL (ref 12.0–15.0)
MCH: 29.9 pg (ref 26.0–34.0)
MCHC: 32.6 g/dL (ref 30.0–36.0)
MCV: 91.8 fL (ref 78.0–100.0)
MPV: 8.9 fL (ref 8.6–12.4)
PLATELETS: 368 10*3/uL (ref 150–400)
RBC: 4.78 MIL/uL (ref 3.87–5.11)
RDW: 15.9 % — AB (ref 11.5–15.5)
WBC: 10.3 10*3/uL (ref 4.0–10.5)

## 2014-08-27 LAB — LIPID PANEL
CHOL/HDL RATIO: 4.8 ratio (ref <=5.0)
Cholesterol: 203 mg/dL — ABNORMAL HIGH (ref 125–200)
HDL: 42 mg/dL — AB (ref >=46)
LDL CALC: 135 mg/dL — AB (ref <130)
Triglycerides: 131 mg/dL (ref <150)
VLDL: 26 mg/dL (ref <30)

## 2014-08-27 LAB — POCT GLYCOSYLATED HEMOGLOBIN (HGB A1C): Hemoglobin A1C: 5.8

## 2014-08-27 MED ORDER — CLINDAMYCIN HCL 300 MG PO CAPS: 300.0000 mg | ORAL_CAPSULE | Freq: Four times a day (QID) | ORAL | Status: DC

## 2014-08-27 MED ORDER — IBUPROFEN 600 MG PO TABS: 600.0000 mg | ORAL_TABLET | Freq: Three times a day (TID) | ORAL | Status: DC | PRN

## 2014-08-27 MED ORDER — METFORMIN HCL 850 MG PO TABS: 850.0000 mg | ORAL_TABLET | Freq: Every day | ORAL | Status: DC

## 2014-08-27 MED ORDER — ASPIRIN 81 MG PO TABS: 81.0000 mg | ORAL_TABLET | Freq: Every day | ORAL | Status: DC

## 2014-08-27 MED ORDER — CARVEDILOL 3.125 MG PO TABS: 3.1250 mg | ORAL_TABLET | Freq: Two times a day (BID) | ORAL | Status: DC

## 2014-08-27 MED ORDER — HYDROCHLOROTHIAZIDE 25 MG PO TABS: 12.5000 mg | ORAL_TABLET | Freq: Every day | ORAL | Status: DC

## 2014-08-27 MED ORDER — CYCLOBENZAPRINE HCL 10 MG PO TABS: 10.0000 mg | ORAL_TABLET | Freq: Three times a day (TID) | ORAL | Status: DC | PRN

## 2014-08-27 NOTE — Assessment & Plan Note (Signed)
Screen for HIV and Hep C

## 2014-08-27 NOTE — Progress Notes (Signed)
Subjective:     Patient ID: Kristine Atkinson, female   DOB: [birth year], 59 y.o.   MRN: [id number]  HPI Documentation performed by Kara Dies Pisanie MS3  GUM PAIN  Pain constant severe pain which started last week, getting slightly worse, throbbing. Pain is located over all her gums but it is worse on the right upper, right lower, and bottom front gums. She also has associated ear pain on the right side. Cannot eat or brush her teeth due to the pain. The patient states that she is hungry. She denies any fevers but has had hot flashes. She has also had intermittent headaches. Negative for hearing loss, discharge from ear, discharge from gums and visual disturbances/change. Patient does not use dentures and she has had her upper and her back lower teeth removed. Patient does not have a dental home and says it will be difficult to see one. The patient says that she does grind her teeth. Last seen by a dentist 3 years ago. No fevers.   HTN  Patient taking Coreg/HCTZ/Losartan, needs refills, no side effects, no chest pain  Social - lives with husband.   Review of Systems Pertinent ROS as per HPI    Objective:   Physical Exam  General: Well appearing female NAD. HEENT: Normocephalic, atraumatic, no lymphadenopathy. Right ear slight erythema of the canal. Unable to fully visualize both TM. No pain to palpation over the lower mandible or maxilla but tenderness over the right gum (upper and lower) and lower front gum. Teeth only present at front portion of the mouth on the bottom other teeth have been extracted. Decay and gingivitis noted at the front gum but no discharge or active bleeding. No other lesions noted on gums. Tongue normal, no tonsillar exudate or hypertrophy. No pain on jaw movement or palpation of TMJ.  CV: RRR, nl S1, S2 no m/r/g Pulm: NWOB, lungs CTAB, no w/c/r Neuro: AOx3, CN2-8 intact 9-12 not tested.      Assessment and Plan     GUM PAIN: Differential includes infection,  chronic inflammation/gingivitis, teeth grinding. Most likely gingivitis, due to poor dental hygiene and inflammation of the gums on exam. Infection cannot be effectively ruled out, but is less likely due to absent history of fevers, pain over the bone or signs of discharge/ abscess formation. Teeth grinding may be contributing to the pain, however there is inflammation of the lower front gum making gingivitis the larger contributer.   - Advised patient to go see dentist and she was amenable and confirmed that she will - Clindamycin 300mg  4/day for 7 days  - Advised patient to use listerine if it hurt to brush her teeth - Ibuprofen 600mg  Q8h  I personally saw and evaluated the patient. I have reviewed the medical student note and agree with the documentation. I personally completed the physical exam. Additions to the medical student note are made in blue.   Dossie Arbour MD

## 2014-08-27 NOTE — Assessment & Plan Note (Addendum)
Blood pressure mildly elevated today on exam. Likely due to pain from teeth/gums. -check lab work -refills of medications provided -started ASA 81 mg daily for secondary stroke prevention

## 2014-08-27 NOTE — Assessment & Plan Note (Signed)
Clinically suspect gingivitis. No clear infection. -empirically start Clindamycin 300 mg QID for 7 days (PCN allergy) -ibuprofen 600 mg TID prn pain -encouraged patient to see a dentist

## 2014-08-27 NOTE — Patient Instructions (Addendum)
It was nice to see you today.  I have provided some refills for you.  Flexeril - I have printed a script for you to take to your pharmacy Coreg - sent to health department HCTZ - sent to [facility] Metformin - refill sent to [facility]  I would like to start you on Aspirin 81 mg daily, you have been provided with a paper script to take to your pharamcy  Dr. Ree Kida has given you a prescription for ibuprofen for pain and Clindamycin(antibiotic), please make an appointment to see a dentist as soon as possible.   Dr. Ree Kida will call you/send you a letter with your lab results.

## 2014-08-28 ENCOUNTER — Telehealth: Payer: Self-pay | Admitting: Family Medicine

## 2014-08-28 DIAGNOSIS — E785 Hyperlipidemia, unspecified: Secondary | ICD-10-CM

## 2014-08-28 LAB — HIV ANTIBODY (ROUTINE TESTING W REFLEX): HIV 1&2 Ab, 4th Generation: NONREACTIVE

## 2014-08-28 LAB — HEPATITIS C ANTIBODY: HCV AB: NEGATIVE

## 2014-08-28 MED ORDER — ATORVASTATIN CALCIUM 40 MG PO TABS: 40.0000 mg | ORAL_TABLET | Freq: Every day | ORAL | Status: DC

## 2014-08-28 NOTE — Assessment & Plan Note (Signed)
Patient continues to not take Lipitor (states that she wasn't aware that she should be on this medication). Need refills.  -sent refill to pharmacy -recheck lipid profile in 6 months

## 2014-08-28 NOTE — Telephone Encounter (Signed)
Called patient to discuss lab work. HIV/HCV negative. Cholesterol elevated (patient not taking Lipitor and needs refill), CBC/CMP mostly unremarkable. Started baby aspirin yesterday. Tooth pain improved with Clindamycin and Ibuprofen.

## 2014-09-26 ENCOUNTER — Ambulatory Visit: Payer: No Typology Code available for payment source

## 2014-09-27 ENCOUNTER — Ambulatory Visit: Payer: No Typology Code available for payment source

## 2014-10-01 ENCOUNTER — Other Ambulatory Visit: Payer: Self-pay | Admitting: *Deleted

## 2014-10-01 DIAGNOSIS — I10 Essential (primary) hypertension: Secondary | ICD-10-CM

## 2014-10-01 MED ORDER — OLMESARTAN MEDOXOMIL 20 MG PO TABS: 20.0000 mg | ORAL_TABLET | Freq: Every day | ORAL | Status: DC

## 2014-11-15 ENCOUNTER — Ambulatory Visit: Payer: No Typology Code available for payment source | Admitting: Family Medicine

## 2014-11-16 ENCOUNTER — Ambulatory Visit: Payer: No Typology Code available for payment source | Admitting: Family Medicine

## 2014-11-16 ENCOUNTER — Ambulatory Visit: Payer: No Typology Code available for payment source | Admitting: Internal Medicine

## 2014-11-16 ENCOUNTER — Ambulatory Visit: Payer: No Typology Code available for payment source

## 2014-11-16 ENCOUNTER — Ambulatory Visit (INDEPENDENT_AMBULATORY_CARE_PROVIDER_SITE_OTHER): Payer: No Typology Code available for payment source | Admitting: *Deleted

## 2014-11-16 DIAGNOSIS — Z23 Encounter for immunization: Secondary | ICD-10-CM

## 2014-11-23 ENCOUNTER — Ambulatory Visit (INDEPENDENT_AMBULATORY_CARE_PROVIDER_SITE_OTHER): Payer: Self-pay | Admitting: Family Medicine

## 2014-11-23 ENCOUNTER — Encounter: Payer: Self-pay | Admitting: Family Medicine

## 2014-11-23 VITALS — BP 121/66 | HR 93 | Temp 98.1°F | Ht 62.0 in | Wt 176.0 lb

## 2014-11-23 DIAGNOSIS — E1143 Type 2 diabetes mellitus with diabetic autonomic (poly)neuropathy: Secondary | ICD-10-CM | POA: Insufficient documentation

## 2014-11-23 DIAGNOSIS — H6123 Impacted cerumen, bilateral: Secondary | ICD-10-CM

## 2014-11-23 DIAGNOSIS — E1142 Type 2 diabetes mellitus with diabetic polyneuropathy: Secondary | ICD-10-CM

## 2014-11-23 DIAGNOSIS — H6120 Impacted cerumen, unspecified ear: Secondary | ICD-10-CM | POA: Insufficient documentation

## 2014-11-23 DIAGNOSIS — I10 Essential (primary) hypertension: Secondary | ICD-10-CM

## 2014-11-23 DIAGNOSIS — E785 Hyperlipidemia, unspecified: Secondary | ICD-10-CM

## 2014-11-23 MED ORDER — HYDROCHLOROTHIAZIDE 25 MG PO TABS: 12.5000 mg | ORAL_TABLET | Freq: Every day | ORAL | Status: DC

## 2014-11-23 MED ORDER — CYCLOBENZAPRINE HCL 10 MG PO TABS: 10.0000 mg | ORAL_TABLET | Freq: Three times a day (TID) | ORAL | Status: DC | PRN

## 2014-11-23 MED ORDER — CAPSAICIN 0.1 % EX CREA: TOPICAL_CREAM | CUTANEOUS | Status: DC

## 2014-11-23 MED ORDER — CARBAMIDE PEROXIDE 6.5 % OT SOLN: 5.0000 [drp] | Freq: Two times a day (BID) | OTIC | Status: DC

## 2014-11-23 MED ORDER — ATORVASTATIN CALCIUM 40 MG PO TABS: 40.0000 mg | ORAL_TABLET | Freq: Every day | ORAL | Status: DC

## 2014-11-23 NOTE — Assessment & Plan Note (Signed)
Patient has bilateral cerumen impaction which may be contributing to her current ear symptoms. No evidence of infection. -Attempt trial of debrox drops to bilateral ears

## 2014-11-23 NOTE — Progress Notes (Signed)
   Subjective:    Patient ID: Kristine Atkinson, female    DOB: 14-Nov-1955, 59 y.o.   MRN: [id number]  HPI 59 year old female presents for routine follow-up.  Hyperlipidemia - patient not currently taking Lipitor as prescribed, she states that it is too expensive at [facility] and would like this prescribed at the [facility]  Hypertension- currently on Coreg 3.125 mg twice a day, HCTZ 12.5 mg twice a day, and Benicar 20 mg daily; compliant with medications, no chest pain, no vision changes, no headache  Bilateral leg pain (left > right) - describes as a burning type sensation with occasional cramping in bilateral feet/shins/calves, no injury, no redness, no swelling, patient is currently on Lyrica 225 mg twice a day which does provide some relief of her symptoms  Bilateral ear pain - in her ear pain of uncertain duration, no fevers, no ear drainage  Social-current smoker  Review of Systems  Constitutional: Negative for fever, chills and fatigue.  Respiratory: Negative for cough, chest tightness and shortness of breath.   Gastrointestinal: Negative for nausea, vomiting and diarrhea.       Objective:   Physical Exam Vitals: Reviewed Gen.: Pleasant female, no acute distress, accompanied by her husband HEENT: Normocephalic, pupils equal round and reactive to light, extraocular movements are intact, no scleral icterus, moist mucous membranes, neck was supple, no anterior/posterior cervical lymphadenopathy, no pain to bilateral tragus/mastoid/TM joint, bilateral TMs obscured by cerumen Cardiac: Regular rate and rhythm, S1 and S2 present, no murmurs, no heaves or thrills Respiratory: Clear to auscultation bilaterally, normal effort Extremities: Trace edema in bilateral extremities, no erythema, no swelling, no calf tenderness Skin: See quality metrics for foot examination MSK: No ankle tenderness, ankle range of motion is full      Assessment & Plan:  Essential  hypertension, benign Blood pressure control today -Continue current regimen of Coreg, HCTZ, and Benicar  Hyperlipidemia with target LDL less than 100 Patient not compliant with Lipitor -Printed paper prescription of Lipitor to take to [facility] health Department  Peripheral autonomic neuropathy due to diabetes mellitus [facility]) Patient presents for evaluation of bilateral lower extremity pain that is most consistent with diabetic peripheral neuropathy. -Continue Lyrica 225 mg twice a day -Attempt trial of capsaicin cream to affected areas  Cerumen impaction Patient has bilateral cerumen impaction which may be contributing to her current ear symptoms. No evidence of infection. -Attempt trial of debrox drops to bilateral ears

## 2014-11-23 NOTE — Assessment & Plan Note (Signed)
Patient presents for evaluation of bilateral lower extremity pain that is most consistent with diabetic peripheral neuropathy. -Continue Lyrica 225 mg twice a day -Attempt trial of capsaicin cream to affected areas

## 2014-11-23 NOTE — Patient Instructions (Signed)
It was nice to see you today.   HCTZ (blood pressure) - sent new refill to [facility] Lipitor (cholesterol) - take to [facility] Cyclobenzaprine - printed refill and take to [facility] Metformin - multiple prescriptions at the pharmacy  Ear pain - likely due to ear wax, start Debrox drops (will send to health department).  Foot pain - attempt a trial of Capsaicin cream

## 2014-11-23 NOTE — Assessment & Plan Note (Signed)
Blood pressure control today -Continue current regimen of Coreg, HCTZ, and Benicar

## 2014-11-23 NOTE — Assessment & Plan Note (Signed)
Patient not compliant with Lipitor -Printed paper prescription of Lipitor to take to Kristine Atkinson

## 2015-01-08 ENCOUNTER — Telehealth: Payer: Self-pay

## 2015-01-08 NOTE — Telephone Encounter (Signed)
Kristine Atkinson [phone]  [facility] to say that she is out of her metformin and that she can no longer get it free at the drug store and she needs to know what to do. She is completely out. Please call her as soon as posible.

## 2015-01-09 ENCOUNTER — Telehealth: Payer: Self-pay | Admitting: Family Medicine

## 2015-01-09 MED ORDER — METFORMIN HCL 850 MG PO TABS: 850.0000 mg | ORAL_TABLET | Freq: Every day | ORAL | Status: DC

## 2015-01-09 NOTE — Telephone Encounter (Signed)
Sent via "fax" button in Epic.

## 2015-01-09 NOTE — Telephone Encounter (Signed)
Pt called and needs her Metformin to be sent to the [facility] now since she has to pay at [facility]. jw

## 2015-01-12 NOTE — Telephone Encounter (Signed)
See other note for details.  Sent to [facility].

## 2015-01-30 ENCOUNTER — Other Ambulatory Visit: Payer: Self-pay | Admitting: Family Medicine

## 2015-01-30 DIAGNOSIS — M797 Fibromyalgia: Secondary | ICD-10-CM

## 2015-01-30 NOTE — Telephone Encounter (Signed)
Nursing Staff: Please contact [facility] (she get Lyrica from [facility]) pt ID [id number]  Phone # [phone] Fax # [phone]  Confirm that she is still in there system, confirm last refill date, if she needs a refill and able to please if give verbal order for Lyrica 225 mg BID, dispense #180, 1 refill; if unable to give verbal confirm fax and best method to send prescription. Thanks.

## 2015-01-30 NOTE — Telephone Encounter (Signed)
Pt called and is very sick and needs her Lyrica called in today. jw

## 2015-01-31 MED ORDER — PREGABALIN 225 MG PO CAPS: 225.0000 mg | ORAL_CAPSULE | Freq: Two times a day (BID) | ORAL | Status: DC

## 2015-01-31 NOTE — Addendum Note (Signed)
Addended by: Lupita Dawn on: 01/31/2015 01:36 PM   Modules accepted: Orders

## 2015-01-31 NOTE — Telephone Encounter (Signed)
[facility], patient rx was list filled in august and her refill expired on 12/18/2014. A new rx will be needed, rx must be faxed to [phone] Attn: Dorothea Ogle. Rx needs to include patient name, date of birth, and address.

## 2015-01-31 NOTE — Telephone Encounter (Signed)
Printed prescription. Placed in fax box.  RN staff - please call patient and inform her of this, please offer appointment if she is not feeling well.   Thanks.

## 2015-02-04 NOTE — Telephone Encounter (Signed)
Pt informed and she made an appt for next week. Kristine Atkinson Holter, CMA

## 2015-02-14 ENCOUNTER — Encounter: Payer: Self-pay | Admitting: Family Medicine

## 2015-02-14 ENCOUNTER — Ambulatory Visit (INDEPENDENT_AMBULATORY_CARE_PROVIDER_SITE_OTHER): Payer: Self-pay | Admitting: Family Medicine

## 2015-02-14 VITALS — BP 161/83 | HR 78 | Temp 98.2°F | Ht 62.0 in | Wt 188.0 lb

## 2015-02-14 DIAGNOSIS — F419 Anxiety disorder, unspecified: Secondary | ICD-10-CM

## 2015-02-14 DIAGNOSIS — M25512 Pain in left shoulder: Secondary | ICD-10-CM

## 2015-02-14 DIAGNOSIS — M79602 Pain in left arm: Secondary | ICD-10-CM

## 2015-02-14 DIAGNOSIS — F418 Other specified anxiety disorders: Secondary | ICD-10-CM

## 2015-02-14 DIAGNOSIS — L299 Pruritus, unspecified: Secondary | ICD-10-CM

## 2015-02-14 DIAGNOSIS — I10 Essential (primary) hypertension: Secondary | ICD-10-CM

## 2015-02-14 DIAGNOSIS — F329 Major depressive disorder, single episode, unspecified: Secondary | ICD-10-CM | POA: Insufficient documentation

## 2015-02-14 DIAGNOSIS — F32A Depression, unspecified: Secondary | ICD-10-CM

## 2015-02-14 MED ORDER — CYCLOBENZAPRINE HCL 10 MG PO TABS: 10.0000 mg | ORAL_TABLET | Freq: Three times a day (TID) | ORAL | Status: DC | PRN

## 2015-02-14 MED ORDER — KETOROLAC TROMETHAMINE 60 MG/2ML IM SOLN
60.0000 mg | Freq: Once | INTRAMUSCULAR | Status: AC
  Administered 2015-02-14: 60 mg via INTRAMUSCULAR

## 2015-02-14 NOTE — Assessment & Plan Note (Signed)
Blood pressure elevated likely due to acute pain. -no changes in pharmacotherapy today -check at next visit in 2 weeks

## 2015-02-14 NOTE — Assessment & Plan Note (Signed)
One month history of left shoulder pain. Etiology unclear as patient not able to tolerate (and was noncompliant) with shoulder exam.  -Toradol 60 mg given in office -Continue Flexeril 10 mg TID at home to relieve muscle spasm -Schedule Ibuprofen 400 mg TID -Apply heat multiple times per day -Return in 2 weeks to repeat examination.

## 2015-02-14 NOTE — Progress Notes (Signed)
   Subjective:    Patient ID: Kristine Atkinson, female    DOB: 1955/08/10, 60 y.o.   MRN: [id number]  HPI   Anxiety/Depression - recently admitted to [facility]/Inpatient psych  Left Arm Pain/Shoulder - present for one month, no injury/trauma, some improvement with movement, constant "burning pain" "like a tooth ache", to improvement with Lyrica, some improvement with Ibuprofen two 200 mg tablets, some chronic numbness - worse with arm pain; no new weakness  Rash on chest - itchy, not improved with daily moisturizers,  No recent changes in detergent or fabric softener.  Social-lives with husband,  History of cocaine abuse  Review of Systems  Constitutional: Negative for chills and fatigue.  Respiratory: Negative for chest tightness and shortness of breath.   Gastrointestinal: Negative for nausea and vomiting.  Musculoskeletal: Positive for back pain and arthralgias.  Neurological: Positive for numbness.       Objective:   Physical Exam Filed Vitals:   02/14/15 1052  BP: 161/83  Pulse: 78  Temp: 98.2 F (36.8 C)   Gen.: Pleasant female, no acute distress, accompanied by husband MSK: cervical spine -  No midline or paraspinal tenderness,  Mild pain of the left trapezius muscle with flexion and extension of the neck:  Left shoulder -  Exam was technically difficult due to patient discomfort and noncompliance with examination, tenderness noted of left trapezius and deltoid, no erythema, Globally afford flexion and abduction were decreased, Hawkings negative, Neer's negative, Empty can/lift off/ER strength 4/5 Neuro: strength bilateral UE 4/5, sensation to light touch diminished on the left Skin: no rash noted of anterior chest Psych: appropriately dressed, affect is outgoing, flight of ideas present, thought process was tangential  MRI Cervical Spine 2009 - multilevel DDD worst at C6-C7, no spinal cord compression     Assessment & Plan:  Left shoulder pain One month history of  left shoulder pain. Etiology unclear as patient not able to tolerate (and was noncompliant) with shoulder exam.  -Toradol 60 mg given in office -Continue Flexeril 10 mg TID at home to relieve muscle spasm -Schedule Ibuprofen 400 mg TID -Apply heat multiple times per day -Return in 2 weeks to repeat examination.   Essential hypertension, benign Blood pressure elevated likely due to acute pain. -no changes in pharmacotherapy today -check at next visit in 2 weeks  Itchy skin Patient reports pruritis of skin. Mostly of chest. No rash present. -continue daily moisturizers (counseled to avoid ones with fragrance) -consider changes in soaps/detergents  Anxiety and depression Patient admitted to inpatient psych in 01/2015. Reviewed discharge summary and updated medications. -sent for records at [facility] -patient encouraged to follow up with counselor/psychiatrist at [facility]

## 2015-02-14 NOTE — Patient Instructions (Addendum)
It was nice to see you today.  Left shoulder pain - likely due to muscle spasm but I can't say 100% that it is not your rotator cuff, attempt trial of Flexeril 10 mg TID, give Toradaol shot today, continue to take Ibuprofen 400 mg three times a day. Return in 2 weeks to re-evaluate. Apply heat to affected area 3 times a day for 15 minutes.   Make an appointment for 2 weks

## 2015-02-14 NOTE — Assessment & Plan Note (Signed)
Patient reports pruritis of skin. Mostly of chest. No rash present. -continue daily moisturizers (counseled to avoid ones with fragrance) -consider changes in soaps/detergents

## 2015-02-14 NOTE — Assessment & Plan Note (Signed)
Patient admitted to inpatient psych in 01/2015. Reviewed discharge summary and updated medications. -sent for records at [facility] -patient encouraged to follow up with counselor/psychiatrist at [facility]

## 2015-02-15 ENCOUNTER — Encounter: Payer: Self-pay | Admitting: Family Medicine

## 2015-02-15 NOTE — Progress Notes (Signed)
Reviewed Discharge Note from Inpatient Hospitalization at [facility] (01/31/15-02/04/15). Updated medication list in EPIC. Started on Celexa 10 mg daily.  Admitted for Depression/Anxiety/Suicidal thoughts.

## 2015-02-28 ENCOUNTER — Ambulatory Visit: Payer: No Typology Code available for payment source | Admitting: Family Medicine

## 2015-03-01 ENCOUNTER — Ambulatory Visit: Payer: No Typology Code available for payment source | Admitting: Family Medicine

## 2015-03-22 ENCOUNTER — Ambulatory Visit (INDEPENDENT_AMBULATORY_CARE_PROVIDER_SITE_OTHER): Payer: Self-pay | Admitting: Family Medicine

## 2015-03-22 ENCOUNTER — Encounter: Payer: Self-pay | Admitting: Family Medicine

## 2015-03-22 VITALS — BP 140/65 | HR 83 | Temp 98.0°F | Ht 62.0 in | Wt 199.2 lb

## 2015-03-22 DIAGNOSIS — M25511 Pain in right shoulder: Secondary | ICD-10-CM | POA: Insufficient documentation

## 2015-03-22 DIAGNOSIS — I10 Essential (primary) hypertension: Secondary | ICD-10-CM

## 2015-03-22 DIAGNOSIS — M25529 Pain in unspecified elbow: Secondary | ICD-10-CM

## 2015-03-22 DIAGNOSIS — M25531 Pain in right wrist: Secondary | ICD-10-CM

## 2015-03-22 DIAGNOSIS — K0889 Other specified disorders of teeth and supporting structures: Secondary | ICD-10-CM

## 2015-03-22 DIAGNOSIS — M25512 Pain in left shoulder: Secondary | ICD-10-CM

## 2015-03-22 DIAGNOSIS — M79629 Pain in unspecified upper arm: Secondary | ICD-10-CM

## 2015-03-22 MED ORDER — IBUPROFEN 600 MG PO TABS: 600.0000 mg | ORAL_TABLET | Freq: Three times a day (TID) | ORAL | Status: DC | PRN

## 2015-03-22 MED ORDER — METHYLPREDNISOLONE ACETATE 40 MG/ML IJ SUSP
40.0000 mg | Freq: Once | INTRAMUSCULAR | Status: AC
  Administered 2015-03-22: 40 mg via INTRA_ARTICULAR

## 2015-03-22 NOTE — Assessment & Plan Note (Signed)
Controlled. -continue home Coreg 3.125 mg BID, Benicar 20 mg daily, and HCTZ 25 mg daily.

## 2015-03-22 NOTE — Assessment & Plan Note (Signed)
Exam consistent with Dequervain's Tenosynovitis. -trial of Ibuprofen 600 mg TID -wrist brace as needed

## 2015-03-22 NOTE — Progress Notes (Signed)
   Subjective:    Patient ID: Kristine Atkinson, female    DOB: 08-22-1955, 60 y.o.   MRN: [id number]  HPI 60 y/o female presents for follow up of left shoulder pain. Accompanied by her husband.   Left shoulder pain - completely resolved, patient denies have left shoulder pain and that her pain was always in her right shoulder  Right shoulder pain - for the past 3-4 months, Can't raise arm, struggles to comb hair. "20"/10 pain. Thinks surgery was scheduled for arm pain (this was not discussed at last visit). Has difficulty sleeping due to pain. Some relief with flexeril, not taking ibuprofen regularly, applied heat intermittently which provides some relief, associated right lateral wrist pain, no known injury  HTN - elevated at last visit, on coreg/Benicar/HCTZ  Social - non-smoker  Review of Systems  Constitutional: Negative for fever and fatigue.  Respiratory: Negative for cough and shortness of breath.   Cardiovascular: Negative for chest pain.  Gastrointestinal: Negative for nausea, vomiting and diarrhea.  Musculoskeletal: Positive for myalgias and arthralgias.       Objective:   Physical Exam BP 140/65 mmHg  Pulse 83  Temp(Src) 98 F (36.7 C) (Oral)  Ht 5\' 2"  (1.575 m)  Wt 199 lb 3.2 oz (90.357 kg)  BMI 36.43 kg/m2  Gen: pleasant AAF, NAD Cardiac: RRR, S1 and S2 present, no murmur Resp: CTAB, normal effort MSK: Cervical spine - ROM is full, no tenderness; Right Shoulder - forward flexion limited to 90 degrees (145 degrees with passive motion), abduction limited to 90 degrees (130 degrees with passive motion), hawkin's negative, neer's positive, Rotator cuff testing very limited due to patient not able to tolerate/participate in exam due to pain, empty can strength 4/5, lift off test 5/5, ER strength 4/5; Left shoulder - ROM is full; Right wrist - tenderness over lateral wrist (EPB and APL). Positive Finkelstein maneuver. Mild positive Tinnel and negative Phalen.    Procedure Note: Subacromial Shoulder Injection (Right) The risks and benefits of the procedure were discussed with the patient. Written consent was obtained. The posterior shoulder was prepped in a sterile fashion. A total of 40 mg DepoMedrol and 5 cc Lidocaine without Epi were injected into the subacromial space using a 25 gauge 1.5 inch need. The patient tolerate the procedure well. Hemostasis achieved. BandAid applied.      Assessment & Plan:  Left shoulder pain Resolved. No further workup planned.   Right shoulder pain Few month history of right shoulder pain. DDX. Rotator cuff tear/tendinopathy vs muscle spasm of deltoid/trapezius vs subacromial bursitis.  -trial of Ibuprofen 600 mg TID -heat/ice to affected area -Subacromial injection performed in office today. -referral to PT  Right wrist pain Exam consistent with Dequervain's Tenosynovitis. -trial of Ibuprofen 600 mg TID -wrist brace as needed   Essential hypertension, benign Controlled. -continue home Coreg 3.125 mg BID, Benicar 20 mg daily, and HCTZ 25 mg daily.

## 2015-03-22 NOTE — Assessment & Plan Note (Signed)
Few month history of right shoulder pain. DDX. Rotator cuff tear/tendinopathy vs muscle spasm of deltoid/trapezius vs subacromial bursitis.  -trial of Ibuprofen 600 mg TID -heat/ice to affected area -Subacromial injection performed in office today. -referral to PT

## 2015-03-22 NOTE — Assessment & Plan Note (Signed)
Resolved. No further workup planned.

## 2015-03-22 NOTE — Patient Instructions (Signed)
It was nice to see you today.  Shoulder pain - likely due to inflammation of the rotator cuff, take Ibuprofen 600 mg three times per day, apply heat/ice as needed, You have been referred to Physical Therapy. Please call to schedule an appointment.  Right Wrist - you have inflammation of one of the tendons, wear wrist brace as needed for the next week, perform home exercises as prescribed by physical therapy.

## 2015-03-28 ENCOUNTER — Other Ambulatory Visit: Payer: Self-pay | Admitting: *Deleted

## 2015-03-28 DIAGNOSIS — I10 Essential (primary) hypertension: Secondary | ICD-10-CM

## 2015-03-29 MED ORDER — OLMESARTAN MEDOXOMIL 20 MG PO TABS
20.0000 mg | ORAL_TABLET | Freq: Every day | ORAL | Status: DC
Start: 2015-03-29 — End: 2015-09-23

## 2015-04-02 ENCOUNTER — Ambulatory Visit: Payer: Self-pay

## 2015-04-22 ENCOUNTER — Ambulatory Visit: Payer: No Typology Code available for payment source | Attending: Family Medicine | Admitting: Physical Therapy

## 2015-05-07 ENCOUNTER — Encounter: Payer: Self-pay | Admitting: Family Medicine

## 2015-05-07 DIAGNOSIS — F69 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: Secondary | ICD-10-CM | POA: Insufficient documentation

## 2015-05-20 ENCOUNTER — Other Ambulatory Visit: Payer: Self-pay | Admitting: *Deleted

## 2015-05-20 ENCOUNTER — Telehealth: Payer: Self-pay

## 2015-05-20 NOTE — Telephone Encounter (Signed)
Rec'd from [facility] forward 10 pages to [place] Provider

## 2015-05-21 ENCOUNTER — Other Ambulatory Visit: Payer: Self-pay | Admitting: Family Medicine

## 2015-05-21 ENCOUNTER — Ambulatory Visit (INDEPENDENT_AMBULATORY_CARE_PROVIDER_SITE_OTHER): Payer: Self-pay | Admitting: Licensed Clinical Social Worker

## 2015-05-21 DIAGNOSIS — F32A Depression, unspecified: Secondary | ICD-10-CM

## 2015-05-21 DIAGNOSIS — F329 Major depressive disorder, single episode, unspecified: Secondary | ICD-10-CM

## 2015-05-21 MED ORDER — CYCLOBENZAPRINE HCL 10 MG PO TABS: 10.0000 mg | ORAL_TABLET | Freq: Three times a day (TID) | ORAL | Status: DC | PRN

## 2015-05-21 NOTE — Progress Notes (Signed)
Dear Dema Severin Team I am putting this on your desk Can u fax to [facility]? THANKS! Dorcas Mcmurray
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
## 2015-06-04 ENCOUNTER — Ambulatory Visit (INDEPENDENT_AMBULATORY_CARE_PROVIDER_SITE_OTHER): Payer: Self-pay | Admitting: Licensed Clinical Social Worker

## 2015-06-04 DIAGNOSIS — F32A Depression, unspecified: Secondary | ICD-10-CM

## 2015-06-04 DIAGNOSIS — F329 Major depressive disorder, single episode, unspecified: Secondary | ICD-10-CM
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
## 2015-06-07 ENCOUNTER — Ambulatory Visit (HOSPITAL_COMMUNITY)
Admission: RE | Admit: 2015-06-07 | Discharge: 2015-06-07 | Disposition: A | Discharge status: Home / Self Care | Payer: No Typology Code available for payment source | Source: Ambulatory Visit | Attending: Family Medicine | Admitting: Family Medicine

## 2015-06-07 ENCOUNTER — Telehealth: Payer: Self-pay | Admitting: Family Medicine

## 2015-06-07 ENCOUNTER — Encounter: Payer: Self-pay | Admitting: Family Medicine

## 2015-06-07 ENCOUNTER — Ambulatory Visit (INDEPENDENT_AMBULATORY_CARE_PROVIDER_SITE_OTHER): Payer: No Typology Code available for payment source | Admitting: Family Medicine

## 2015-06-07 VITALS — BP 119/72 | HR 82 | Temp 97.6°F | Ht 62.0 in | Wt 204.6 lb

## 2015-06-07 DIAGNOSIS — R079 Chest pain, unspecified: Secondary | ICD-10-CM

## 2015-06-07 DIAGNOSIS — E119 Type 2 diabetes mellitus without complications: Secondary | ICD-10-CM

## 2015-06-07 DIAGNOSIS — K219 Gastro-esophageal reflux disease without esophagitis: Secondary | ICD-10-CM

## 2015-06-07 DIAGNOSIS — I10 Essential (primary) hypertension: Secondary | ICD-10-CM

## 2015-06-07 DIAGNOSIS — E1142 Type 2 diabetes mellitus with diabetic polyneuropathy: Secondary | ICD-10-CM

## 2015-06-07 DIAGNOSIS — R0789 Other chest pain: Secondary | ICD-10-CM

## 2015-06-07 DIAGNOSIS — E785 Hyperlipidemia, unspecified: Secondary | ICD-10-CM

## 2015-06-07 DIAGNOSIS — M797 Fibromyalgia: Secondary | ICD-10-CM

## 2015-06-07 HISTORY — DX: Chest pain, unspecified: R07.9

## 2015-06-07 LAB — CBC
HCT: 40.6 % (ref 36.0–46.0)
Hemoglobin: 12.8 g/dL (ref 12.0–15.0)
MCH: 28.9 pg (ref 26.0–34.0)
MCHC: 31.5 g/dL (ref 30.0–36.0)
MCV: 91.6 fL (ref 78.0–100.0)
PLATELETS: 415 10*3/uL — AB (ref 150–400)
RBC: 4.43 MIL/uL (ref 3.87–5.11)
RDW: 16.8 % — AB (ref 11.5–15.5)
WBC: 9.2 10*3/uL (ref 4.0–10.5)

## 2015-06-07 LAB — COMPREHENSIVE METABOLIC PANEL
ALT: 14 U/L (ref 14–54)
AST: 16 U/L (ref 15–41)
Albumin: 3.9 g/dL (ref 3.5–5.0)
Alkaline Phosphatase: 96 U/L (ref 38–126)
Anion gap: 6 (ref 5–15)
BUN: 13 mg/dL (ref 6–20)
CHLORIDE: 103 mmol/L (ref 101–111)
CO2: 30 mmol/L (ref 22–32)
Calcium: 9.4 mg/dL (ref 8.9–10.3)
Creatinine, Ser: 0.89 mg/dL (ref 0.44–1.00)
GFR calc Af Amer: >60 mL/min (ref >60)
Glucose, Bld: 86 mg/dL (ref 65–99)
POTASSIUM: 4.5 mmol/L (ref 3.5–5.1)
Sodium: 139 mmol/L (ref 135–145)
Total Bilirubin: 0.4 mg/dL (ref 0.3–1.2)
Total Protein: 7.1 g/dL (ref 6.5–8.1)

## 2015-06-07 LAB — TROPONIN I

## 2015-06-07 LAB — POCT GLYCOSYLATED HEMOGLOBIN (HGB A1C): Hemoglobin A1C: 6.2

## 2015-06-07 MED ORDER — OMEPRAZOLE 20 MG PO CPDR: 20.0000 mg | DELAYED_RELEASE_CAPSULE | Freq: Two times a day (BID) | ORAL | Status: DC

## 2015-06-07 MED ORDER — ATORVASTATIN CALCIUM 40 MG PO TABS: 40.0000 mg | ORAL_TABLET | Freq: Every day | ORAL | Status: DC

## 2015-06-07 MED ORDER — CARVEDILOL 3.125 MG PO TABS: 3.1250 mg | ORAL_TABLET | Freq: Two times a day (BID) | ORAL | Status: DC

## 2015-06-07 MED ORDER — ASPIRIN 81 MG PO TABS: 81.0000 mg | ORAL_TABLET | Freq: Every day | ORAL | Status: AC

## 2015-06-07 MED ORDER — CYCLOBENZAPRINE HCL 10 MG PO TABS: 10.0000 mg | ORAL_TABLET | Freq: Three times a day (TID) | ORAL | Status: DC | PRN

## 2015-06-07 MED ORDER — CITALOPRAM HYDROBROMIDE 10 MG PO TABS: 10.0000 mg | ORAL_TABLET | Freq: Every day | ORAL | Status: DC

## 2015-06-07 MED ORDER — CAPSAICIN 0.1 % EX CREA: TOPICAL_CREAM | CUTANEOUS | Status: DC

## 2015-06-07 MED ORDER — ASPIRIN 325 MG PO TABS
325.0000 mg | ORAL_TABLET | Freq: Once | ORAL | Status: AC
  Administered 2015-06-07: 325 mg via ORAL

## 2015-06-07 MED ORDER — HYDROCHLOROTHIAZIDE 25 MG PO TABS: 12.5000 mg | ORAL_TABLET | Freq: Every day | ORAL | Status: DC

## 2015-06-07 NOTE — Assessment & Plan Note (Signed)
Patient has symptoms consistent with GERD. Has been out of PPI. -refill of Omeprazole sent to pharmacy.

## 2015-06-07 NOTE — Assessment & Plan Note (Addendum)
Patient presents with 4-5 days of substernal chest pain. EKG showed no ischemia. I advised that the patient be evaluated in the ED acutely however she refused. I offered stat labs work including Troponin/CBC/CMP. Given ASA 325 mg in office.  -will contact patient with results -GERD may also be in the differential and will restart Omeprazole -no previous Stress Test in Epic, consider cardiology referral if chest pain persists and labs negative

## 2015-06-07 NOTE — Progress Notes (Signed)
Subjective:     Patient ID: Kristine Atkinson, female   DOB: [birth year], 60 y.o.   MRN: [id number]  HPI  60 y/o female presents for routine follow up. History is very difficult to interpret as patient has flight of ideas and tangential thoughts throughout the exam.   Diabetes: She is taking her metformin but thinks she is gaining weight on metformin. Also says she feels really bloated when she takes metformin. She has gained 5 pounds since her last visit on 03/22/2015.Has difficulty taking Lyrica because of her chronic pain. Diet consists of Koolaid, says she eats out once a month. Eats home-cooked meals, snacks, potato chips, occasional burger.  Hypertension:  Prescribed Coreg 3.125 mg BID, HCTZ 25 mg Daily, and Benicar 20 mg daily, she reports that has run out of hypertension meds and needs refills, unable to say how long she has been out of meds. Has headaches infrequently.   Chest Pain: She reports 4-5 days of substernal chest pain, no radiation, continuous, no relation to exertion, some burning and some sharp pain, she is heart attacks. Her chest pain sometimes feels like she has a lot of gas built up in her chest. Had similar symptoms last week.   Chronic Pain:  Recently evaluated for Disability and declined. Currently seeking SSI. Says meds are not helping and was advised by lawyer to get documentation of pain at every clinical visit. Patient reports that her psychiatrist said "your doctor is not giving you pain medications that are strong enough".   GERD:  Out of Omeprazole for months, reports frequent belching.   Social - patient has history of cocaine use, denies current use  Patient requested that I contact her lawyer and psychiatrist; Lawyer: Truddie Hidden, [place] (Social and Disability Law). [phone] Psychiatrist: [place]: [phone]  Review of Systems  Constitutional: Negative for fever, chills and fatigue.  Respiratory: Negative for  cough and shortness of breath.   Cardiovascular: Positive for chest pain.  Gastrointestinal: Negative for nausea and diarrhea.       Objective:   Physical Exam BP 119/72 mmHg  Pulse 82  Temp(Src) 97.6 F (36.4 C) (Oral)  Ht 5\' 2"  (1.575 m)  Wt 204 lb 9.6 oz (92.806 kg)  BMI 37.41 kg/m2 Gen: AAM, very irritated at times Cardiac: RRR, S1 and S2 present, no murmur Resp: CTAB, normal effort Abd: soft, no tenderness, NBS MSK: mild substernal chest tenderness Psych: appropriately groomed, affect outgoing/irritated, tangential thoughts and flight of ideas present.   A1C 6.2 EKG: NSR, mild Twave inversions V1 (unchanged from previous), no other acute changes.     Assessment:     60 y/o female presents for follow up of chronic problems and reports acute chest pain.      Plan:     Essential hypertension, benign Blood pressure controlled.  -refills of medications sent to pharmacy  Diabetes type 2, controlled Controlled. A1C 6.2%. -stop metformin -recheck in 3 months -patient informed to get yearly eye exams  Chest pain Patient presents with 4-5 days of substernal chest pain. EKG showed no ischemia. I advised that the patient be evaluated in the ED acutely however she refused. I offered stat labs work including Troponin/CBC/CMP. Given ASA 325 mg in office.  -will contact patient with results -GERD may also be in the differential and will restart Omeprazole -no previous Stress Test in Epic, consider cardiology referral if chest pain persists and labs negative   Fibromyalgia Patient has chronic pain of multiple  locations. 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 I feel that she is a poor candidate for narcotic pain medications as she is a high risk for abuse.  -referral to pain management specialist -I will continue to see her for acute pain issues  -[facility], patient had 1 refill of  Lyrica available then they will process this request. She will need to re-apply to the program by the end of next month.   GERD (gastroesophageal reflux disease) Patient has symptoms consistent with GERD. Has been out of PPI. -refill of Omeprazole sent to pharmacy.    I did not have the patient sign a record release form today. I will have nursing staff complete forms and mail to patient. Once I have received the forms I will contact the patient's lawyer and psychiatrist.

## 2015-06-07 NOTE — Telephone Encounter (Signed)
Contacted patient and discussed negative Troponin. Informed her that [facility] will send refill of Lyrica (see progress note). Also told her that I would have nursing staff mail record request forms for lawyer and therapist.   RN staff - please mail record release forms to Ms. Durig for the following individuals.

## 2015-06-07 NOTE — Patient Instructions (Addendum)
It was nice to see you today.   Diabetes - your blood sugars are well controlled, stop Metformin as this may be causing bloating in your stomach. You are due for an eye exam. Please schedule an appointment to see your eye doctor.   Hypertension - well controlled, refills of blood pressure meds sent to pharmacy  Chest pain - your EKG was normal, I have ordered some STAT labs that I will call you about later today. If this comes back positive you will need to come back to the hospital immediately.   Chronic pain/Fibromyalgia - I will send you to a pain management physician. A referral has been made. I will call [facility] to refill your Lyrica.

## 2015-06-07 NOTE — Telephone Encounter (Signed)
Lawyer: Truddie Hidden, [place] (Social and Disability Law). [place]: [phone]

## 2015-06-07 NOTE — Assessment & Plan Note (Addendum)
Blood pressure controlled.  -refills of medications sent to pharmacy

## 2015-06-07 NOTE — Assessment & Plan Note (Addendum)
Patient has chronic pain of multiple locations. 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 I feel that she is a poor candidate for narcotic pain medications as she is a high risk for abuse.  -referral to pain management specialist -I will continue to see her for acute pain issues  -[facility], patient had 1 refill of Lyrica available then they will process this request. She will need to re-apply to the program by the end of next month.

## 2015-06-07 NOTE — Assessment & Plan Note (Signed)
Controlled. A1C 6.2%. -stop metformin -recheck in 3 months -patient informed to get yearly eye exams

## 2015-06-17 NOTE — Telephone Encounter (Signed)
ROI's mailed to patient by [place].

## 2015-06-20 ENCOUNTER — Other Ambulatory Visit: Payer: Self-pay | Admitting: Licensed Clinical Social Worker

## 2015-06-27 ENCOUNTER — Ambulatory Visit (INDEPENDENT_AMBULATORY_CARE_PROVIDER_SITE_OTHER): Payer: Self-pay | Admitting: Licensed Clinical Social Worker

## 2015-06-27 DIAGNOSIS — F32A Depression, unspecified: Secondary | ICD-10-CM

## 2015-06-27 DIAGNOSIS — F329 Major depressive disorder, single episode, unspecified: Secondary | ICD-10-CM
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
## 2015-06-28 ENCOUNTER — Encounter: Payer: Self-pay | Admitting: Family Medicine

## 2015-06-28 ENCOUNTER — Telehealth: Payer: Self-pay | Admitting: Family Medicine

## 2015-06-28 ENCOUNTER — Ambulatory Visit (INDEPENDENT_AMBULATORY_CARE_PROVIDER_SITE_OTHER): Payer: No Typology Code available for payment source | Admitting: Family Medicine

## 2015-06-28 VITALS — BP 143/79 | HR 65 | Temp 98.4°F | Ht 62.0 in | Wt 203.2 lb

## 2015-06-28 DIAGNOSIS — F329 Major depressive disorder, single episode, unspecified: Secondary | ICD-10-CM

## 2015-06-28 DIAGNOSIS — R0789 Other chest pain: Secondary | ICD-10-CM

## 2015-06-28 DIAGNOSIS — F418 Other specified anxiety disorders: Secondary | ICD-10-CM

## 2015-06-28 DIAGNOSIS — B349 Viral infection, unspecified: Secondary | ICD-10-CM

## 2015-06-28 DIAGNOSIS — F419 Anxiety disorder, unspecified: Secondary | ICD-10-CM

## 2015-06-28 DIAGNOSIS — H6123 Impacted cerumen, bilateral: Secondary | ICD-10-CM

## 2015-06-28 MED ORDER — CARBAMIDE PEROXIDE 6.5 % OT SOLN: 5.0000 [drp] | Freq: Two times a day (BID) | OTIC | Status: DC

## 2015-06-28 NOTE — Assessment & Plan Note (Signed)
Constellation of symptoms (cough/diarrhea/abdominal pain) most consistent with viral illness. Exam negative today for more severe etiology. -encouraged conservative management including Tylenol for pain.  -abdominal pain most likely GI in nature (no GYN exam completed as no other GYN symptoms); patient will be scheduled for annual exam/Pap at next visit.

## 2015-06-28 NOTE — Telephone Encounter (Signed)
RN staff please call the patient and address the following issues.  1. Needs to be scheduled for annual exam/Pap 2. Please inform her that I have referred her to Cardiology to follow up on her chest pain, our office will call her to notify her of the appointment 3. She informed me today that she has started Prozac and Seroquel however does not know the doses. Please ask her to provide the dose and frequency of these medications.   Dr. Ree Kida

## 2015-06-28 NOTE — Patient Instructions (Addendum)
It was nice seeing you today. I suspect your symptoms are due to a virus which should improve in the next 5-7 days. Please take Tylenol as needed for pain.   Please call [facility] with dosages of Prozac and Seroquel so we can put it into the system.

## 2015-06-28 NOTE — Progress Notes (Signed)
   Subjective:    Patient ID: Kristine Atkinson, female    DOB: Oct 03, 1955, 60 y.o.   MRN: [id number]  HPI  60 y/o female presents for follow up of chest pain.  Chest Pain - seen on 5/26, had acute chest pain at that time, patient declined evaluation in ED, EKG and lab work up negative, chest pain now resolved  Chest cold: Coughing up phlegm for 2 weeks. Getting a little bitter. No shortness of breath, No fever but some diarrhea and abd pain. Took Nyquil which helped with sleep. No more chest pain. Has ear pain bilaterally described as being itchy.   Abd pain: Present since the start of cough above, Pain 8/10 normally but right now 6/10. Worse prior to bowel movement but improves with bowel movement. Associated one week history of diarrhea that is starting to resolve. Pain bilateral lower quadrant, No diminished appetite. No vaginal discharge.  No pain or burning with urination.   Mental Health/Depression - seen at [facility], prescribed Prozac and Seroquel (unsure of doses).  Social - current smoker   Review of Systems See above    Objective:   Physical Exam  BP 143/79 mmHg  Pulse 65  Temp(Src) 98.4 F (36.9 C) (Oral)  Ht 5\' 2"  (1.575 m)  Wt 203 lb 3.2 oz (92.171 kg)  BMI 37.16 kg/m2  Gen: pleasant obese AAF HEENT: Normocephalic, PERRL, EOMI, no scleral icterus, no rhinorrhea, MMM, uvula midline, neck supple, no cervical adenopathy, bilateral TM's obscured by cerumen.  Pulm: CTAB, normal work of breathing CV: RRR, no MRG Abd: Soft, non-tender, normal bowel sounds     Assessment & Plan:  Chest pain Acute episode of chest pain from previous visit has resolved. Workup including EKG and cardiac enzymes negative. -will refer to cardiology for possible stress test   Viral illness Constellation of symptoms (cough/diarrhea/abdominal pain) most consistent with viral illness. Exam negative today for more severe etiology. -encouraged conservative management including Tylenol for  pain.  -abdominal pain most likely GI in nature (no GYN exam completed as no other GYN symptoms); patient will be scheduled for annual exam/Pap at next visit.   Anxiety and depression Patient following with [facility]. Transitioned to Prozac and Seroquel -patient to call office to provide doses of medications

## 2015-06-28 NOTE — Assessment & Plan Note (Signed)
Patient following with [facility]. Transitioned to Prozac and Seroquel -patient to call office to provide doses of medications

## 2015-06-28 NOTE — Assessment & Plan Note (Signed)
Acute episode of chest pain from previous visit has resolved. Workup including EKG and cardiac enzymes negative. -will refer to cardiology for possible stress test

## 2015-07-02 NOTE — Addendum Note (Signed)
Addended by: Lupita Dawn on: 07/02/2015 03:31 PM   Modules accepted: Medications

## 2015-07-02 NOTE — Telephone Encounter (Signed)
Pt scheduled for an annual exam and her husband gave me the doses of her meds. Fluoxitine 20mg  1 pill at night and the seroquel 100 mg 1 pill everyday. Deseree Kennon Holter, CMA

## 2015-07-09 ENCOUNTER — Ambulatory Visit: Payer: No Typology Code available for payment source

## 2015-07-15 ENCOUNTER — Ambulatory Visit (INDEPENDENT_AMBULATORY_CARE_PROVIDER_SITE_OTHER): Payer: No Typology Code available for payment source | Admitting: Cardiology

## 2015-07-15 ENCOUNTER — Encounter: Payer: Self-pay | Admitting: Cardiology

## 2015-07-15 VITALS — BP 118/80 | HR 82 | Ht 62.5 in | Wt 206.8 lb

## 2015-07-15 DIAGNOSIS — R079 Chest pain, unspecified: Secondary | ICD-10-CM

## 2015-07-15 NOTE — Patient Instructions (Signed)
Medication Instructions:  Your physician recommends that you continue on your current medications as directed. Please refer to the Current Medication list given to you today.  Labwork: None ordered  Testing/Procedures: None ordered  Follow-Up: No follow up is needed at this time with Dr. Curt Bears.  He will see you on an as needed basis.  Thank you for choosing [facility]!!

## 2015-07-15 NOTE — Progress Notes (Signed)
Cardiology Office Note   Date:  07/15/2015   ID:  Quashonda, Fantin 1956/01/06, MRN [id number]  PCP:  Uvaldo Rising, MD  Cardiologist:  Regan Lemming, MD    Chief Complaint  Patient presents with  . New Patient (Initial Visit)  . Chest Pain     History of Present Illness: Kristine Atkinson is a 60 y.o. female who presents today for cardiology evaluation.   She presents today for evaluation of chest pain. She says that the pain is been going on for the last month. She says that the pain has been consistently present and has never gone away. The pain is not associated with exertion. She says that when she lies flat the pain is worse. She also says that the pain is worse when eating onions and spicy foods. The pain does not get better when she rests. She says that she takes Maalox and Tums which improved the pain.   Today, she denies symptoms of palpitations, shortness of breath, orthopnea, PND, lower extremity edema, claudication, dizziness, presyncope, syncope, bleeding, or neurologic sequela. The patient is tolerating medications without difficulties and is otherwise without complaint today.    Past Medical History  Diagnosis Date  . Chronic pain   . Diabetes mellitus   . Hypertension   . Fibromyalgia   . Cocaine abuse 09/09/2012    Cocaine positive on UDS 09/09/2012.    Marland Kitchen Hyperlipidemia    Past Surgical History  Procedure Laterality Date  . Oophorectomy      ectopic     Current Outpatient Prescriptions  Medication Sig Dispense Refill  . aspirin 81 MG tablet Take 1 tablet (81 mg total) by mouth daily. 100 tablet 1  . atorvastatin (LIPITOR) 40 MG tablet Take 1 tablet (40 mg total) by mouth daily. 90 tablet 1  . Capsaicin 0.1 % CREA Apply to affected areas as needed twice daily. 56.6 g 2  . carbamide peroxide (DEBROX) 6.5 % otic solution Place 5 drops into both ears 2 (two) times daily. 15 mL 0  . carvedilol (COREG) 3.125 MG tablet Take 1 tablet (3.125 mg  total) by mouth 2 (two) times daily. 180 tablet 1  . cyclobenzaprine (FLEXERIL) 10 MG tablet Take 1 tablet (10 mg total) by mouth 3 (three) times daily as needed for muscle spasms. 30 tablet 0  . FLUoxetine (PROZAC) 20 MG capsule Take 20 mg by mouth daily.    . hydrochlorothiazide (HYDRODIURIL) 25 MG tablet Take 0.5 tablets (12.5 mg total) by mouth daily. 45 tablet 1  . ibuprofen (ADVIL,MOTRIN) 600 MG tablet Take 1 tablet (600 mg total) by mouth every 8 (eight) hours as needed. 60 tablet 1  . olmesartan (BENICAR) 20 MG tablet Take 1 tablet (20 mg total) by mouth at bedtime. 90 tablet 1  . omeprazole (PRILOSEC) 20 MG capsule Take 1 capsule (20 mg total) by mouth 2 (two) times daily. 180 capsule 1  . pregabalin (LYRICA) 225 MG capsule Take 1 capsule (225 mg total) by mouth 2 (two) times daily. 180 capsule 1  . QUEtiapine (SEROQUEL) 100 MG tablet Take 100 mg by mouth at bedtime.    Marland Kitchen Spacer/Aero-Holding Chambers (E-Z SPACER) inhaler Use as instructed 1 each 0   No current facility-administered medications for this visit.    Allergies:   Penicillins   Social History:  The patient  reports that she has been smoking Cigarettes.  She started smoking about 42 years ago. She has a 8.2 pack-year smoking  history. She has never used smokeless tobacco. She reports that she does not drink alcohol.   Family History:  The patient's family history includes Cancer in her maternal aunt; Hypertension in her mother.    ROS:  Please see the history of present illness.   Otherwise, review of systems is positive for Weight changes, chest pain, leg swelling, shortness of breath at night, shortness of breath lying down, leg pain, hearing loss, visual disturbance, cough, snoring, constipation, abdominal pain, depression, back pain, muscle pain, balance problems, dizziness.   All other systems are reviewed and negative.    PHYSICAL EXAM: VS:  BP 118/80 mmHg  Pulse 82  Ht 5' 2.5" (1.588 m)  Wt 206 lb 12.8 oz (93.804  kg)  BMI 37.20 kg/m2 , BMI Body mass index is 37.2 kg/(m^2). GEN: Well nourished, well developed, in no acute distress HEENT: normal Neck: no JVD, carotid bruits, or masses Cardiac: RRR; no murmurs, rubs, or gallops,no edema  Respiratory:  clear to auscultation bilaterally, normal work of breathing GI: soft, nontender, nondistended, + BS MS: no deformity or atrophy Skin: warm and dry Neuro:  Strength and sensation are intact Psych: euthymic mood, full affect  EKG:  EKG is ordered today. The ekg ordered today shows sinus rhythm, rate 82  Recent Labs: 06/07/2015: ALT 14; BUN 13; Creatinine, Ser 0.89; Hemoglobin 12.8; Platelets 415*; Potassium 4.5; Sodium 139    Lipid Panel     Component Value Date/Time   CHOL 203* 08/27/2014 0902   TRIG 131 08/27/2014 0902   HDL 42* 08/27/2014 0902   CHOLHDL 4.8 08/27/2014 0902   VLDL 26 08/27/2014 0902   LDLCALC 135* 08/27/2014 0902     Wt Readings from Last 3 Encounters:  07/15/15 206 lb 12.8 oz (93.804 kg)  06/28/15 203 lb 3.2 oz (92.171 kg)  06/07/15 204 lb 9.6 oz (92.806 kg)      Other studies Reviewed: Additional studies/ records that were reviewed today include: TTE 8/15  Review of the above records today demonstrates:  - Left ventricle: The cavity size was normal. Systolic function was vigorous. The estimated ejection fraction was in the range of 65% to 70%. Wall motion was normal; there were no regional wall motion abnormalities. Doppler parameters are consistent with abnormal left ventricular relaxation (grade 1 diastolic dysfunction). - Left atrium: The atrium was mildly dilated.   ASSESSMENT AND PLAN:  1.  Chest pain: At this time, it does not appear that her chest pain is due to cardiac causes. She tells me that the pain is worse with eating certain foods and laying down. It is better when taking antacids. Due to that it is likely that this is due to a GI source. EKG today did not show any evidence of ischemia.  We'll have her follow-up as needed.    Current medicines are reviewed at length with the patient today.   The patient does not have concerns regarding her medicines.  The following changes were made today:  none  Labs/ tests ordered today include:  No orders of the defined types were placed in this encounter.     Disposition:   FU with Reba Hulett PRN  Signed, Leodis Alcocer Jorja Loa, MD  07/15/2015 2:55 PM     [facility] [address] [place] [address] [phone] (office) [phone] (fax)

## 2015-07-18 ENCOUNTER — Ambulatory Visit (INDEPENDENT_AMBULATORY_CARE_PROVIDER_SITE_OTHER): Payer: No Typology Code available for payment source | Admitting: Family Medicine

## 2015-07-18 ENCOUNTER — Encounter: Payer: Self-pay | Admitting: Family Medicine

## 2015-07-18 ENCOUNTER — Other Ambulatory Visit (HOSPITAL_COMMUNITY)
Admission: RE | Admit: 2015-07-18 | Discharge: 2015-07-18 | Disposition: A | Discharge status: Home / Self Care | Payer: No Typology Code available for payment source | Source: Ambulatory Visit | Attending: Family Medicine | Admitting: Family Medicine

## 2015-07-18 VITALS — BP 140/73 | HR 88 | Temp 97.9°F | Ht 62.0 in | Wt 210.0 lb

## 2015-07-18 DIAGNOSIS — Z0000 Encounter for general adult medical examination without abnormal findings: Secondary | ICD-10-CM

## 2015-07-18 DIAGNOSIS — Z87891 Personal history of nicotine dependence: Secondary | ICD-10-CM

## 2015-07-18 DIAGNOSIS — Z124 Encounter for screening for malignant neoplasm of cervix: Secondary | ICD-10-CM

## 2015-07-18 DIAGNOSIS — Z01419 Encounter for gynecological examination (general) (routine) without abnormal findings: Secondary | ICD-10-CM | POA: Insufficient documentation

## 2015-07-18 DIAGNOSIS — E669 Obesity, unspecified: Secondary | ICD-10-CM

## 2015-07-18 DIAGNOSIS — Z1151 Encounter for screening for human papillomavirus (HPV): Secondary | ICD-10-CM | POA: Insufficient documentation

## 2015-07-18 NOTE — Assessment & Plan Note (Signed)
Patient presents for annual well women/preventative exam. -Up to date on immunizations -Pap smear completed -Information on Mammogram provided -Information on Living Will/POA provided -Encouraged healthy diet and regular exercise

## 2015-07-18 NOTE — Assessment & Plan Note (Addendum)
Patient quit smoking on 06/13/15.

## 2015-07-18 NOTE — Assessment & Plan Note (Signed)
Discussed lifestyle modifications.

## 2015-07-18 NOTE — Progress Notes (Signed)
60 y.o. year old female presents for well woman/preventative visit and annual GYN examination.  Acute Concerns: Chronic Pain - still waiting to hear from pain management, needs paperwork completed for [place] program to get Lyrica.   Diet: Breakfast - toast when she eats breakfast, also has "weenies"; Lunch - BBQ chicken; Dinner - liver/meat/peas; drinks Cranberry juice daily  Exercise: walks daily (main mode of transportation)  Sexual/Birth History: has sex with husband, mild dryness  Birth Control: Menopausal (last period roughly 2005)  POA/Living Will: None  Social:  Social History   Social History  . Marital Status: Married    Spouse Name: N/A  . Number of Children: N/A  . Years of Education: N/A   Social History Main Topics  . Smoking status: Former Smoker -- 0.20 packs/day for 41 years    Types: Cigarettes    Start date: 01/12/1973    Quit date: 06/13/2015  . Smokeless tobacco: Never Used     Comment: smokes about 3-4 cigs per day  as of  02/2014  . Alcohol Use: No  . Drug Use: None  . Sexual Activity: Not Asked   Other Topics Concern  . None   Social History Narrative   Lives with husband Panagiota Faul).  Has 3 grown children.  Does not work.  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  Stopped smoking one month ago.   Immunization: Immunization History  Administered Date(s) Administered  . Influenza,inj,Quad PF,36+ Mos 09/30/2012, 10/23/2013, 11/16/2014  . Pneumococcal Polysaccharide-23 07/14/2011  . Tdap 07/14/2011    Cancer Screening:  Pap Smear: normal in 2013, no HPV completed  Mammogram: 2015 ([facility] 1)  Colonoscopy: 2013 (poor prep, normal, repeat in 5 years)     Physical Exam: VITALS: Reviewed GEN: Pleasant female, NAD HEENT: Normocephalic, PERRL, EOMI, no scleral icterus, bilateral TM pearly grey, nasal septum midline, MMM, uvula midline, no anterior or posterior lymphadenopathy, no  thyromegaly CARDIAC:RRR, S1 and S2 present, no murmur, no heaves/thrills RESP: CTAB, normal effort BREAST:Not examined.  ABD: soft, no tenderness, normal bowel sounds GU/GYN:Exam performed in the presence of a chaperone. Normal external genitalia. Cervix unremarkable. Bimanual exam identified no masses. Pap smear completed.  EXT: No edema, 2+ radial and DP pulses SKIN: no rash  ASSESSMENT & PLAN: 60 y.o. female presents for annual well woman/preventative exam and GYN exam. Please see problem specific assessment and plan.   Health care maintenance Patient presents for annual well women/preventative exam. -Up to date on immunizations -Pap smear completed -Information on Mammogram provided -Information on Living Will/POA provided -Encouraged healthy diet and regular exercise  Obesity (BMI 30-39.9) Discussed lifestyle modifications.

## 2015-07-18 NOTE — Patient Instructions (Signed)
It was nice to see you today.  Congratulations on quitting smoking.  Dr. Ree Kida will call you with your Pap Smear Results. Please call to schedule your mammogram.

## 2015-07-19 LAB — CYTOLOGY - PAP

## 2015-07-22 ENCOUNTER — Encounter: Payer: Self-pay | Admitting: Family Medicine

## 2015-07-25 ENCOUNTER — Other Ambulatory Visit: Payer: Self-pay | Admitting: Licensed Clinical Social Worker

## 2015-08-13 ENCOUNTER — Ambulatory Visit: Payer: No Typology Code available for payment source

## 2015-08-28 ENCOUNTER — Telehealth: Payer: Self-pay | Admitting: Family Medicine

## 2015-08-28 NOTE — Telephone Encounter (Signed)
Patient informed, expressed understanding. 

## 2015-08-28 NOTE — Telephone Encounter (Signed)
Nursing staff - please call the patient and inform her that the pain clinic has denied to see her. I will discuss in more detail with her at our next appointment.

## 2015-08-29 ENCOUNTER — Other Ambulatory Visit: Payer: Self-pay | Admitting: Licensed Clinical Social Worker

## 2015-08-29 ENCOUNTER — Ambulatory Visit (INDEPENDENT_AMBULATORY_CARE_PROVIDER_SITE_OTHER): Payer: Self-pay | Admitting: Licensed Clinical Social Worker

## 2015-08-29 DIAGNOSIS — F329 Major depressive disorder, single episode, unspecified: Secondary | ICD-10-CM

## 2015-08-29 DIAGNOSIS — F32A Depression, unspecified: Secondary | ICD-10-CM

## 2015-08-30 NOTE — Progress Notes (Signed)
   THERAPY PROGRESS NOTE  Session Time: 24min  Participation Level: Active  Behavioral Response: Casual and DisheveledAlertDepressed  Type of Therapy: Individual Therapy  Treatment Goals addressed: Coping  Interventions: Other: EMDR  Summary: MARGREAT MCCLOSKEY is a 60 y.o. female who presents with a depressed mood and labile affect. Ms. Eckhardt reported that she has been in a lot of pain lately and very depressed. She reported that her sister was in the hospital and that while she visited her, she was feeling resentful because her sister would probably not visit her if the situation were reversed. She reported that her arm has been very painful lately and that she feels hopeless to ever find a doctor who can help her. Ms. Popham agreed to try EMDR therapy and identified her pain and hopelessness as the focus of treatment. She identified her negative core belief as "I'm unlovable, I'm worthless." She reported that the positive cognition she would like to have is "I'm lovable and worthy." She shared about past events that have caused her to feel her negative core belief. Ms. Hibbett then engaged in several rounds of restricted processing EMDR, briefly sharing insights in between rounds of bilateral stimulation. Her SUD (Subjective Unit of Distress) went from a 10 to a 4 by the end of the session.  Suicidal/Homicidal: Nowithout intent/plan  Therapist Response: LCSW utilized supportive counseling techniques throughout the session in order to validate emotions and encourage open expression of emotion. LCSW explained the philosophy and techniques of EMDR (Eye Movement De-sensitization and Reprocessing); LCSW obtained verbal consent to engage in this intervention. LCSW engaged Ms. Nichter in EMDR restricted processing (EMD) by first creating a targeting sequence plan. LCSW assisted Ms. Madura in identifying the presenting problem, the negative core belief associated with the problem, and the desired  positive cognition. LCSW noted present and past events that are related to the core belief, including the touchstone. LCSW scaled the Validity of Positive Belief and Subjective Unit of Disturbance. LCSW then completed multiple sets of bilateral stimulation for desensitization of the negative belief, and multiple sets of bilateral stimulation for installation of the positive cognition. LCSW and [name] processed about the experience.  Plan: Return again in 4 weeks.  Diagnosis: Axis I: See current hospital problem list    Axis II: No diagnosis    Metta Clines, LCSW 08/30/2015

## 2015-09-05 ENCOUNTER — Ambulatory Visit (HOSPITAL_COMMUNITY)
Admission: RE | Admit: 2015-09-05 | Discharge: 2015-09-05 | Disposition: A | Discharge status: Home / Self Care | Payer: No Typology Code available for payment source | Source: Ambulatory Visit | Attending: Family Medicine | Admitting: Family Medicine

## 2015-09-05 ENCOUNTER — Ambulatory Visit (INDEPENDENT_AMBULATORY_CARE_PROVIDER_SITE_OTHER): Payer: No Typology Code available for payment source | Admitting: Family Medicine

## 2015-09-05 ENCOUNTER — Encounter: Payer: Self-pay | Admitting: Family Medicine

## 2015-09-05 VITALS — BP 159/91 | HR 75 | Temp 98.0°F | Wt 206.6 lb

## 2015-09-05 DIAGNOSIS — M25512 Pain in left shoulder: Secondary | ICD-10-CM | POA: Insufficient documentation

## 2015-09-05 DIAGNOSIS — M797 Fibromyalgia: Secondary | ICD-10-CM

## 2015-09-05 DIAGNOSIS — E1142 Type 2 diabetes mellitus with diabetic polyneuropathy: Secondary | ICD-10-CM

## 2015-09-05 DIAGNOSIS — M19012 Primary osteoarthritis, left shoulder: Secondary | ICD-10-CM | POA: Insufficient documentation

## 2015-09-05 DIAGNOSIS — I10 Essential (primary) hypertension: Secondary | ICD-10-CM

## 2015-09-05 DIAGNOSIS — R21 Rash and other nonspecific skin eruption: Secondary | ICD-10-CM | POA: Insufficient documentation

## 2015-09-05 LAB — POCT SKIN KOH: Skin KOH, POC: NEGATIVE

## 2015-09-05 LAB — POCT GLYCOSYLATED HEMOGLOBIN (HGB A1C): Hemoglobin A1C: 6.8

## 2015-09-05 MED ORDER — CYCLOBENZAPRINE HCL 10 MG PO TABS: 10.0000 mg | ORAL_TABLET | Freq: Three times a day (TID) | ORAL | 0 refills | Status: DC | PRN

## 2015-09-05 MED ORDER — HYDROCHLOROTHIAZIDE 25 MG PO TABS: 25.0000 mg | ORAL_TABLET | Freq: Every day | ORAL | 1 refills | Status: DC

## 2015-09-05 NOTE — Assessment & Plan Note (Signed)
Intermittent Left Shoulder Pain. Rotator cuff testing intact. She does exhibit some impingement signs. -check xray -continue heat/ice as tolerated -if xray unremarkable consider steroid injection at future visits

## 2015-09-05 NOTE — Patient Instructions (Addendum)
It was nice to see you today.  Shoulder pain - check xray of the left shoulder today  Chronic Pain - Dr. Ree Kida will call [facility] to get refills of Lyrica, refills of Flexeril given today  Blood Pressure - increase fluid pill (HCTZ) to 25 mg daily.   Dr. Ree Kida will call you with the results of your skin test.

## 2015-09-05 NOTE — Assessment & Plan Note (Signed)
Patient continues to have chronic pain of multiple sites. History complicated by Personality disorder and history of cocaine abuse. [facility] declined to take her on as a patient due to history of substance abuse. -will call [facility] to refill Lyrica -refilled Flexeril as this seems to provide some relief of symptoms -patient continues to follow with [facility] for care of her behavioral health needs

## 2015-09-05 NOTE — Assessment & Plan Note (Signed)
Blood pressure elevated. Likely due to pain. -as controlled at last visit will not change current regimen -recheck next visit

## 2015-09-05 NOTE — Assessment & Plan Note (Signed)
Controlled. A1C 6.8 -continue yearly eye exam and foot exams

## 2015-09-05 NOTE — Assessment & Plan Note (Signed)
2 cm by 2.5 cm erythematous circular plaque posterior right calf, excoriations present; clinically most consistent with Tinea vs superficial bacterial infection -check KOH stain today -if negative will treat with topical antibiotics (patient given multiple packets of Triple Antibiotic cream today)

## 2015-09-05 NOTE — Progress Notes (Signed)
   Subjective:    Patient ID: Kristine Atkinson, female    DOB: July 30, 1955, 60 y.o.   MRN: [id number]  HPI 60 y/o female presents for routine visit.  Skin Rash - right posterior leg, present for one month, stated as small red "bite" and has grown, has not applied any topical meds, no fevers, she thinks that perhaps she was bitten by a spider  Chronic Pain - [facility] recently declined to take her on as a patient due to previous cocaine use, I notified patient of this denial. She reports chronic pain in multiple sites that "moves", out of Lyrica and needs refill sent to [place], her left shoulder is the point of most pain currently  Left shoulder pain - intermittent, unclear what exacerbates pain, improved with lyrica, no injury, does not apply heat/ice  DM - currently diet controlled, has not seen eye doctor in over a year  Social - former smoker   Review of Systems  Respiratory: Negative for cough, chest tightness and shortness of breath.   Cardiovascular: Negative for chest pain and leg swelling.       Objective:   Physical Exam  BP (!) 159/91   Pulse 75   Temp 98 F (36.7 C) (Oral)   Wt 206 lb 9.6 oz (93.7 kg)   BMI 37.79 kg/m  Gen: pleasant female, NAD Cardiac: RRR, S1 and S2 present, no murmur Resp: CTAB, normal effort Skin: 2 cm by 2.5 cm erythematous circular plaque posterior right calf, excoriations present (see photo) MSK: left shoulder - deltoid and anterior AC tenderness, ROM is full, empty can/lift off/ER strength 5/5, mild pain [place] and [place]     POC 6.8     Assessment & Plan:  Diabetes type 2, controlled Controlled. A1C 6.8 -continue yearly eye exam and foot exams  Essential hypertension, benign Blood pressure elevated. Likely due to pain. -as controlled at last visit will not change current regimen -recheck next visit  Left shoulder pain Intermittent Left Shoulder Pain. Rotator cuff testing intact. She does exhibit some impingement  signs. -check xray -continue heat/ice as tolerated -if xray unremarkable consider steroid injection at future visits  Rash 2 cm by 2.5 cm erythematous circular plaque posterior right calf, excoriations present; clinically most consistent with Tinea vs superficial bacterial infection -check KOH stain today -if negative will treat with topical antibiotics (patient given multiple packets of Triple Antibiotic cream today)  Fibromyalgia Patient continues to have chronic pain of multiple sites. History complicated by Personality disorder and history of cocaine abuse. [facility] declined to take her on as a patient due to history of substance abuse. -will call [facility] to refill Lyrica -refilled Flexeril as this seems to provide some relief of symptoms -patient continues to follow with [facility] for care of her behavioral health needs

## 2015-09-06 ENCOUNTER — Telehealth: Payer: Self-pay | Admitting: Family Medicine

## 2015-09-06 DIAGNOSIS — R21 Rash and other nonspecific skin eruption: Secondary | ICD-10-CM

## 2015-09-06 DIAGNOSIS — M25512 Pain in left shoulder: Secondary | ICD-10-CM

## 2015-09-06 MED ORDER — CLOTRIMAZOLE-BETAMETHASONE 1-0.05 % EX CREA: 1.0000 "application " | TOPICAL_CREAM | Freq: Two times a day (BID) | CUTANEOUS | 0 refills | Status: DC

## 2015-09-06 NOTE — Assessment & Plan Note (Signed)
KOH negative however exam consistent with fungal infection. Trial of Lotrisone.

## 2015-09-06 NOTE — Assessment & Plan Note (Signed)
Xray 08/2015 showed OA.

## 2015-09-06 NOTE — Telephone Encounter (Signed)
Called patient and discuss xray which showed OA. Start aspercreme. KOH negative however exam consistent with fungal infection. Trial of Lotrisone.

## 2015-09-09 ENCOUNTER — Other Ambulatory Visit: Payer: Self-pay | Admitting: Family Medicine

## 2015-09-09 DIAGNOSIS — M797 Fibromyalgia: Secondary | ICD-10-CM

## 2015-09-09 MED ORDER — PREGABALIN 225 MG PO CAPS: 225.0000 mg | ORAL_CAPSULE | Freq: Two times a day (BID) | ORAL | 1 refills | Status: DC

## 2015-09-11 ENCOUNTER — Telehealth: Payer: Self-pay | Admitting: Family Medicine

## 2015-09-11 MED ORDER — TRIAMCINOLONE ACETONIDE 0.5 % EX OINT: 1.0000 "application " | TOPICAL_OINTMENT | Freq: Two times a day (BID) | CUTANEOUS | 0 refills | Status: DC

## 2015-09-11 MED ORDER — TERBINAFINE HCL 1 % EX CREA: 1.0000 "application " | TOPICAL_CREAM | Freq: Two times a day (BID) | CUTANEOUS | 0 refills | Status: DC

## 2015-09-11 NOTE — Telephone Encounter (Signed)
Pt says the RX for lyrica is suppose to be faxed straight to [facility] and it is delivered to her home.   RX for the creme that goes on her legs is costing $59 and she cannot afford that. Can she get something cheaper?

## 2015-09-11 NOTE — Telephone Encounter (Signed)
RN staff - please call patient and inform her that Lyrica prescription was faxed on Monday and that they will send it to her house. In regards to the cream for her leg (Lortrizone) I have called in two separate creams (Terbinafine and Kenalog) that are the same ingredients as the [place]. Please pick up and apply both of these creams.

## 2015-09-12 NOTE — Telephone Encounter (Signed)
Informed patient husband of message below

## 2015-09-23 ENCOUNTER — Other Ambulatory Visit: Payer: Self-pay | Admitting: *Deleted

## 2015-09-23 DIAGNOSIS — I10 Essential (primary) hypertension: Secondary | ICD-10-CM

## 2015-09-23 MED ORDER — OLMESARTAN MEDOXOMIL 20 MG PO TABS: 20.0000 mg | ORAL_TABLET | Freq: Every day | ORAL | 1 refills | Status: DC

## 2015-09-23 NOTE — Progress Notes (Signed)
   Subjective:    Patient ID: Kristine Atkinson, female    DOB: 07-26-1955, 60 y.o.   MRN: [id number]  HPI 60 y/o female presents for routine follow up.  Skin rash right leg Exam consistent with fungal infection however KOH negative last visit, started on trial of lamisil and triamcinolone ointment (not taking), started cortisone and rash is improving  Chronic Pain/Fibromyalgia Currently on Lyrica and Flexeril prn.   Left shoulder pain Evaluated at last visit, recommend trial of ice/heat. Xrays obtained and outlined below. Improved from last visit  Leg Pain Posterior thigh pain for 2 days, no injuries, has taken Lyrica and Flexeril, no skin changes  Obesity Walks daily, eats a lot of sweets  Hypertension Taking HCTZ 25 mg daily, Olmesartan 20 mg daily, and Coreg 3.125 BID, taking as prescribed, no chest pain, no headaches, no vision changes  Depression/Behavioral Health Seen at [facility]. Updated medication list.    Review of Systems  Constitutional: Negative for chills and fatigue.  Respiratory: Negative for cough and shortness of breath.   Gastrointestinal: Negative for abdominal pain, diarrhea, nausea and vomiting.       Objective:   Physical Exam BP 134/82   Pulse 90   Temp 98.4 F (36.9 C)   Ht 5\' 2"  (1.575 m)   Wt 208 lb 12.8 oz (94.7 kg)   BMI 38.19 kg/m    Gen: pleasant female, NAD Cardiac: RRR, S1 and S2 present, no murmur Resp: CTAB, normal effort Skin: right post leg - hyperpigmented patch (improved from last visit, lest scale) MSK: bilateral posterior thigh - no bruising, no rash, no tenderness   Xray left shoulder IMPRESSION: Mild osteoarthritic changes centered on the glenohumeral and AC joints. There is no acute bony abnormality.     Assessment & Plan:  Essential hypertension, benign Controlled. At goal per [facility] -continue HCTZ 25 mg daily, Olmesartan 20 mg daily, and Coreg 3.125 BID  Obesity (BMI 30-39.9) Patient walking daily (main mode  of transportation). Discussed need to cut down on sweets. Patient interested in weight loss.   Left shoulder pain Improved from last visit. Xrays did show OA. -monitor clinically  Health care maintenance Flu shot provided.   Rash Improved with otc steroid cream -continue cortisone   Fibromyalgia Patient has bilateral leg pain. No clear etiology. Suspect due to fibromyalgia. -continue Lyrica and prn Flexeril

## 2015-09-25 ENCOUNTER — Ambulatory Visit (INDEPENDENT_AMBULATORY_CARE_PROVIDER_SITE_OTHER): Payer: Self-pay | Admitting: Licensed Clinical Social Worker

## 2015-09-25 DIAGNOSIS — F329 Major depressive disorder, single episode, unspecified: Secondary | ICD-10-CM

## 2015-09-25 DIAGNOSIS — F32A Depression, unspecified: Secondary | ICD-10-CM

## 2015-09-26 ENCOUNTER — Ambulatory Visit (INDEPENDENT_AMBULATORY_CARE_PROVIDER_SITE_OTHER): Payer: No Typology Code available for payment source | Admitting: Family Medicine

## 2015-09-26 ENCOUNTER — Encounter: Payer: Self-pay | Admitting: Family Medicine

## 2015-09-26 DIAGNOSIS — Z0000 Encounter for general adult medical examination without abnormal findings: Secondary | ICD-10-CM

## 2015-09-26 DIAGNOSIS — F69 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: Secondary | ICD-10-CM

## 2015-09-26 DIAGNOSIS — M797 Fibromyalgia: Secondary | ICD-10-CM

## 2015-09-26 DIAGNOSIS — I10 Essential (primary) hypertension: Secondary | ICD-10-CM

## 2015-09-26 DIAGNOSIS — E1142 Type 2 diabetes mellitus with diabetic polyneuropathy: Secondary | ICD-10-CM

## 2015-09-26 DIAGNOSIS — E669 Obesity, unspecified: Secondary | ICD-10-CM

## 2015-09-26 DIAGNOSIS — Z23 Encounter for immunization: Secondary | ICD-10-CM

## 2015-09-26 DIAGNOSIS — M25512 Pain in left shoulder: Secondary | ICD-10-CM

## 2015-09-26 DIAGNOSIS — R21 Rash and other nonspecific skin eruption: Secondary | ICD-10-CM

## 2015-09-26 NOTE — Patient Instructions (Signed)
It was nice to see you today.  Blood pressure - controlled, continue Coreg, Olmesartan, and HCTZ  Leg pain/Fibromyaligia - likely from fibromyalgia, continue Lyrica and Flexeril  Rash - continue cortisone cream

## 2015-09-26 NOTE — Progress Notes (Signed)
   THERAPY PROGRESS NOTE  Session Time: 43min  Participation Level: Active  Behavioral Response: CasualAlertDepressed  Type of Therapy: Individual Therapy  Treatment Goals addressed: Coping  Interventions: Supportive and Other: EMDR  Summary: Kristine Atkinson is a 59 y.o. female who presents with a depressed mood and appropriate affect. She reported that she has been upset lately since her father-in-law stopped answering her phone calls. She shared that she often feels frustrated and hurt when she has miscommunication with other people; she stated that people don't seem to understand her. Kristine Atkinson decided that the focus of EMDR today would be the pain in her legs. She identified the negative core belief as "I'm unloveable," with the desired positive cognition of "People like me the way I am." She rated her Subjective Unit of Distress at an 8 out of 10. Kristine Atkinson participated in multiple rounds of bilateral stimulation for desensitization, until she rated her SUD as a 0. She then engaged in several rounds of stimulation for reprocessing the positive cognition. She reported that she felt more relaxed and happy.   Suicidal/Homicidal: Nowithout intent/plan  Therapist Response: LCSW utilized supportive counseling techniques throughout the session in order to validate emotions and encourage open expression of emotion. LCSW engaged Kristine Atkinson in EMDR contained processing (EMDr) by first creating a targeting sequence plan. LCSW assisted Kristine Atkinson in identifying the presenting problem, the negative core belief associated with the problem, and the desired positive cognition. LCSW scaled the Validity of Positive Belief and Subjective Unit of Disturbance. LCSW then completed multiple sets of bilateral stimulation for desensitization of the negative belief, and multiple sets of bilateral stimulation for installation of the positive cognition. LCSW and Kristine Atkinson processed about the experience.  Plan:  Return again in 4 weeks.  Diagnosis: Axis I: See current hospital problem list    Axis II: No diagnosis    Metta Clines, LCSW 09/26/2015

## 2015-09-27 ENCOUNTER — Encounter: Payer: Self-pay | Admitting: Family Medicine

## 2015-09-27 MED ORDER — BENZTROPINE MESYLATE 1 MG PO TABS: 0.5000 mg | ORAL_TABLET | Freq: Every day | ORAL | Status: DC

## 2015-09-27 NOTE — Assessment & Plan Note (Signed)
Improved with otc steroid cream -continue cortisone

## 2015-09-27 NOTE — Assessment & Plan Note (Signed)
Patient walking daily (main mode of transportation). Discussed need to cut down on sweets. Patient interested in weight loss.

## 2015-09-27 NOTE — Assessment & Plan Note (Signed)
Flu shot provided. 

## 2015-09-27 NOTE — Assessment & Plan Note (Signed)
Patient has bilateral leg pain. No clear etiology. Suspect due to fibromyalgia. -continue Lyrica and prn Flexeril

## 2015-09-27 NOTE — Assessment & Plan Note (Signed)
Follows with [facility].  Currently on Prozac 20 mg daily, Benztropine 0.5 mg daily, and Seroquel 200 mg QHS

## 2015-09-27 NOTE — Assessment & Plan Note (Signed)
Controlled. At goal per [facility] -continue HCTZ 25 mg daily, Olmesartan 20 mg daily, and Coreg 3.125 BID

## 2015-09-27 NOTE — Assessment & Plan Note (Signed)
Improved from last visit. Xrays did show OA. -monitor clinically

## 2015-10-23 ENCOUNTER — Other Ambulatory Visit: Payer: Self-pay | Admitting: Licensed Clinical Social Worker

## 2015-11-01 ENCOUNTER — Ambulatory Visit (INDEPENDENT_AMBULATORY_CARE_PROVIDER_SITE_OTHER): Payer: Self-pay | Admitting: Licensed Clinical Social Worker

## 2015-11-01 DIAGNOSIS — F3289 Other specified depressive episodes: Secondary | ICD-10-CM

## 2015-11-01 NOTE — Progress Notes (Signed)
   THERAPY PROGRESS NOTE  Session Time: 58min  Participation Level: Active  Behavioral Response: CasualAlertEuthymic  Type of Therapy: Individual Therapy  Treatment Goals addressed: Coping  Interventions: Other: EMDR  Summary: Kristine Atkinson is a 60 y.o. female who presents with a euthymic mood and appropriate affect. She reported that she has been feeling less depressed lately except when she thinks of the possibility of her husband dying. She became tearful and distressed as she shared about the ways that her life would unravel without him. She participated in several rounds of EMDR to address the fears of her husband dying. She identified her negative core belief as "I'm alone," and her desired positive cognition as "I'm going to be okay no matter what." She rated her initial distress at a 5 out of 10 and ended at a 0 out of 10. She shared that she was feeling much better and knew that she would be okay regardless of what happened.   Suicidal/Homicidal: Nowithout intent/plan  Therapist Response: LCSW utilized supportive counseling techniques throughout the session in order to validate emotions and encourage open expression of emotion. LCSW engaged Ms Tyminski in EMDR contained processing (EMDr) by first creating a targeting sequence plan. LCSW assisted Ms Donlin in identifying the presenting problem, the negative core belief associated with the problem, and the desired positive cognition. LCSW noted present and past events that are related to the core belief, including the touchstone. LCSW scaled the Validity of Positive Belief and Subjective Unit of Disturbance and completed a body scan. LCSW then completed multiple sets of bilateral stimulation for desensitization of the negative belief, and multiple sets of bilateral stimulation for installation of the positive cognition. LCSW and Ms Leventry processed about the experience.  Plan: Return again in 4 weeks.  Diagnosis: Axis I: See current  hospital problem list    Axis II: No diagnosis    Metta Clines, LCSW 11/01/2015

## 2015-11-04 IMAGING — MG MM DIGITAL SCREENING BILAT
2 series · 2 of 2 positions shown · non-contrast
Comparison: Previous exam(s).

CLINICAL DATA: Screening.

EXAM:
DIGITAL SCREENING BILATERAL MAMMOGRAM WITH CAD

[R CC]
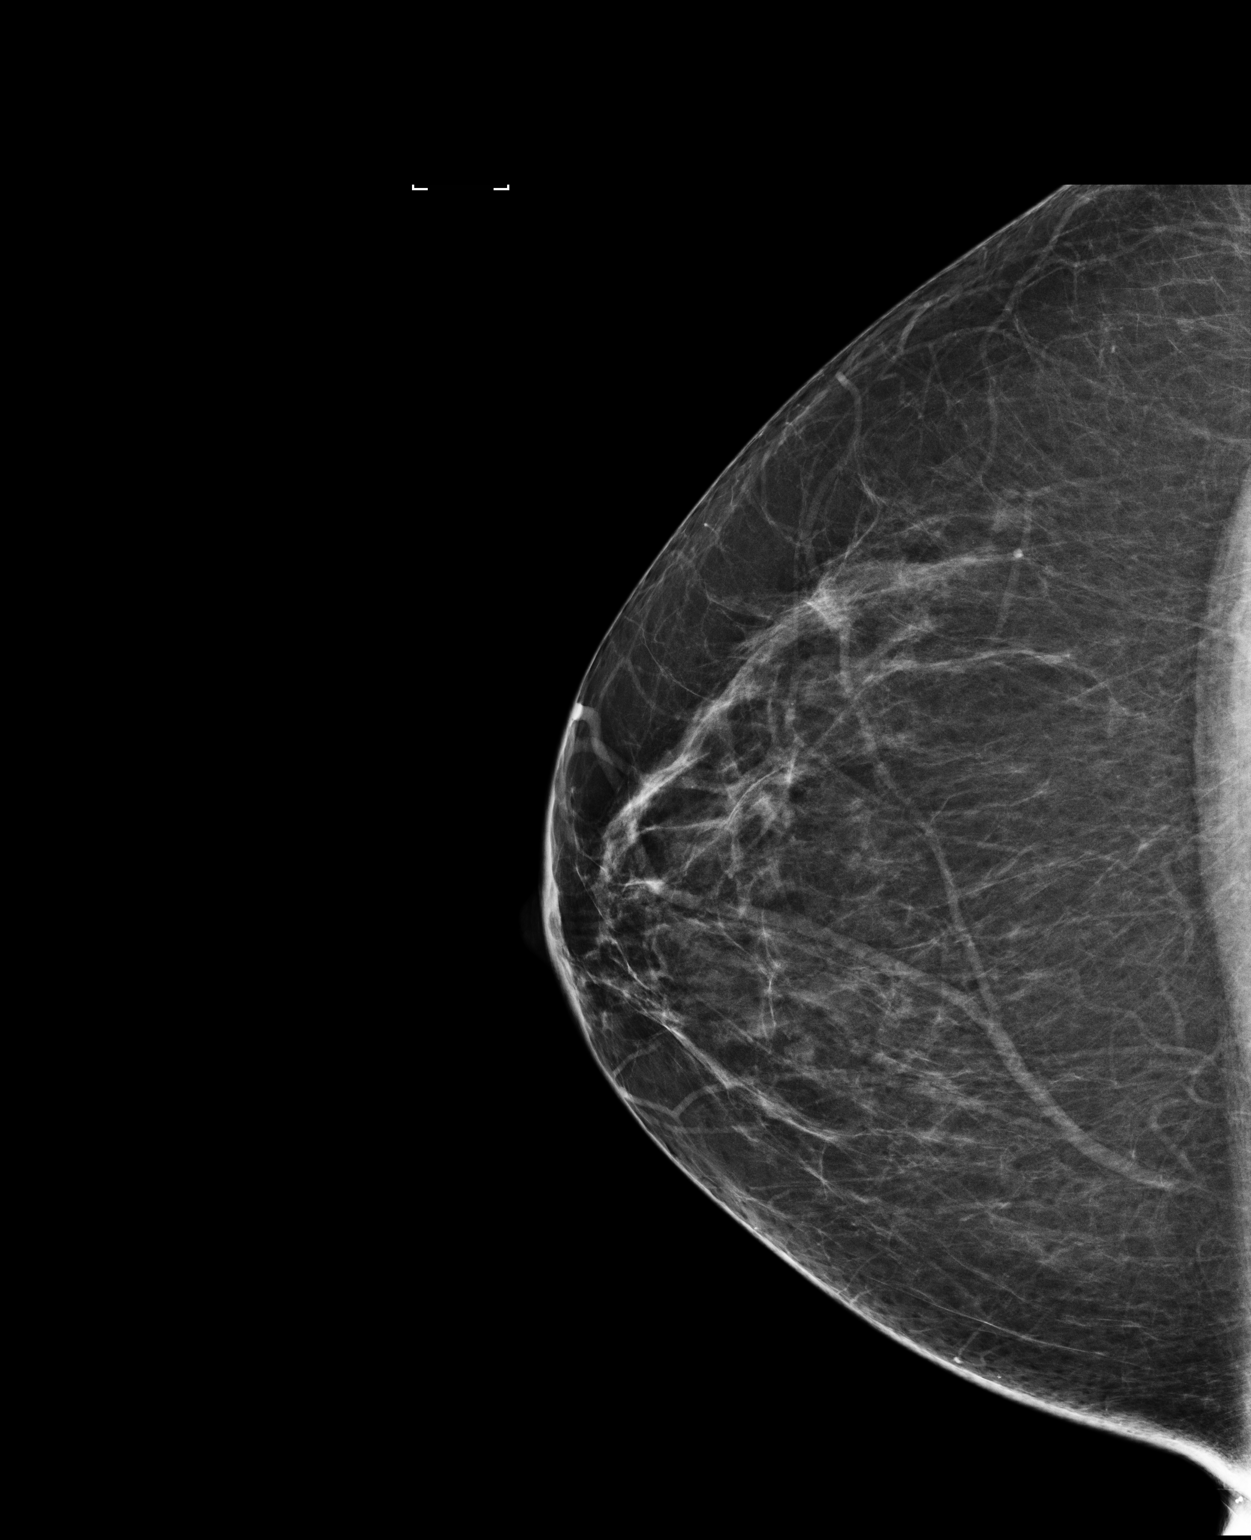

[L CC]
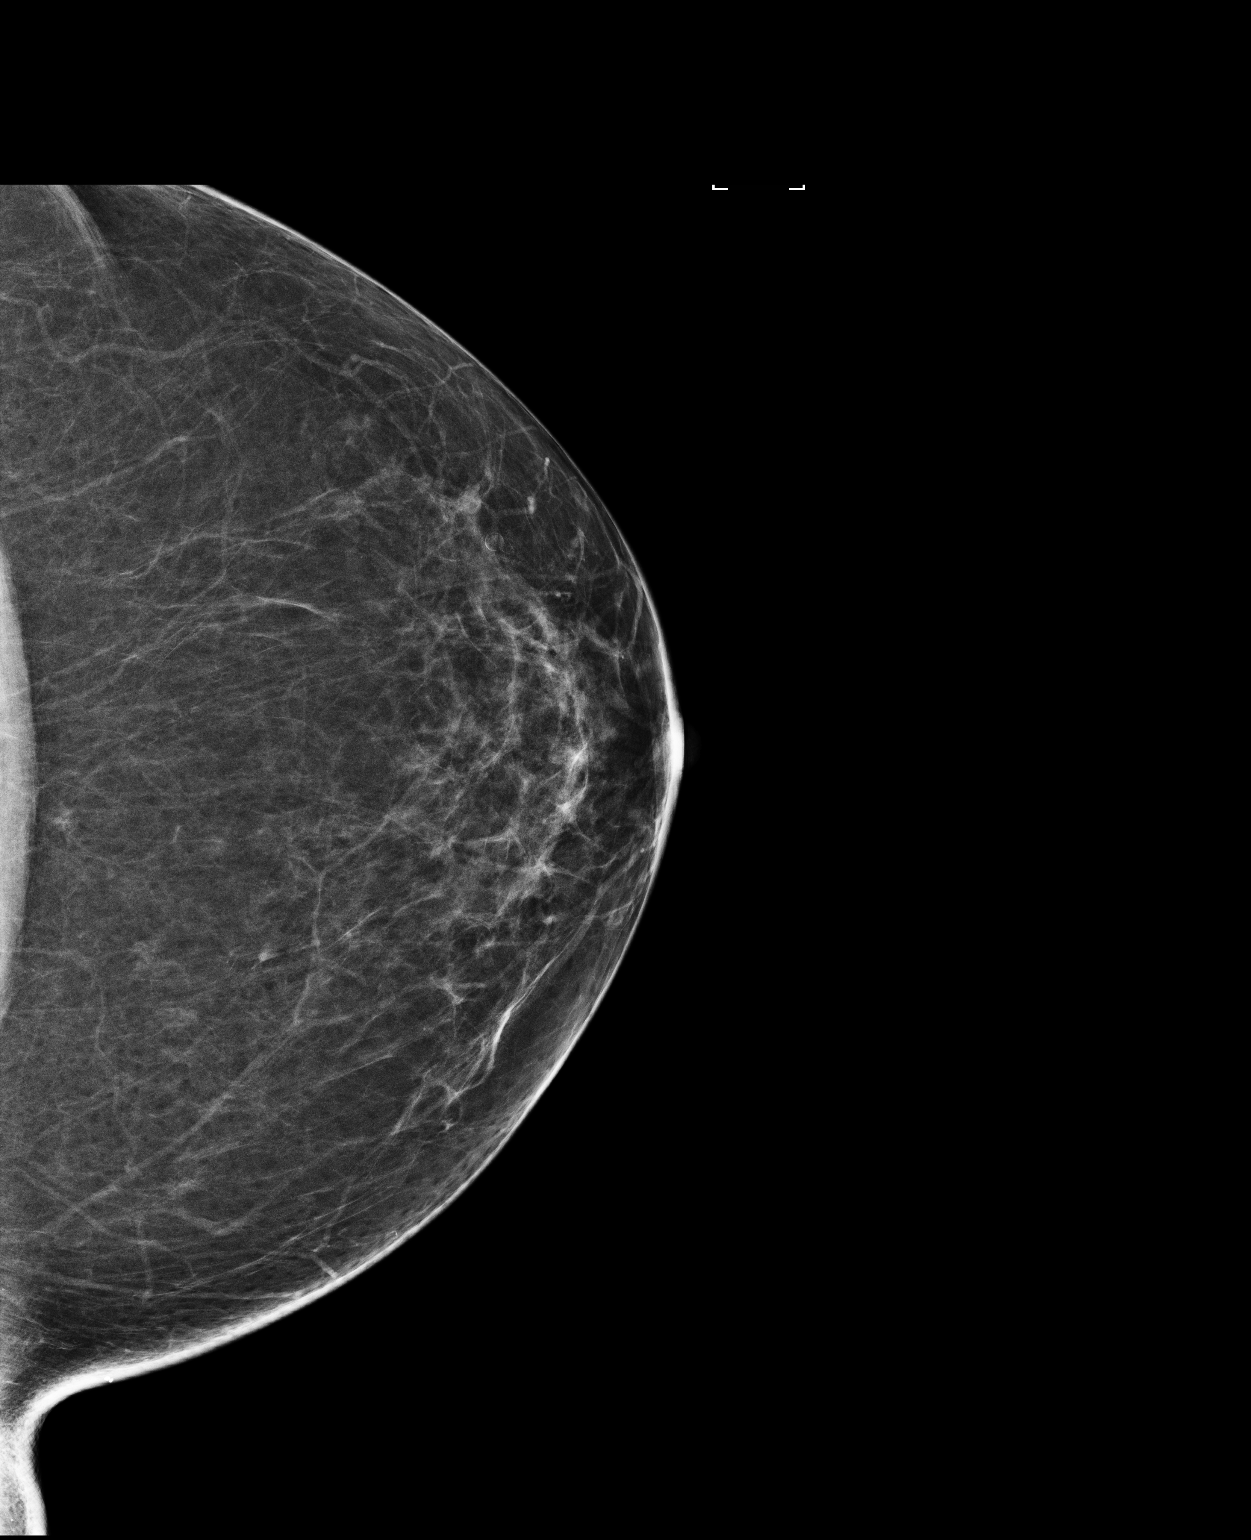

[2 of 2 positions shown; findings below may reference images not displayed]

ACR Breast Density Category b: There are scattered areas of
fibroglandular density.
FINDINGS: There are no findings suspicious for malignancy. Images were
processed with CAD.
IMPRESSION: No mammographic evidence of malignancy. A result letter of this
screening mammogram will be mailed directly to the patient.

RECOMMENDATION:
Screening mammogram in one year. (Code:AS-G-LCT)

BI-RADS CATEGORY  1: Negative.

## 2015-11-25 ENCOUNTER — Other Ambulatory Visit: Payer: Self-pay | Admitting: Licensed Clinical Social Worker

## 2015-12-25 ENCOUNTER — Other Ambulatory Visit: Payer: Self-pay | Admitting: Licensed Clinical Social Worker

## 2015-12-27 ENCOUNTER — Ambulatory Visit (INDEPENDENT_AMBULATORY_CARE_PROVIDER_SITE_OTHER): Payer: No Typology Code available for payment source | Admitting: Family Medicine

## 2015-12-27 ENCOUNTER — Encounter: Payer: Self-pay | Admitting: Family Medicine

## 2015-12-27 DIAGNOSIS — Z0000 Encounter for general adult medical examination without abnormal findings: Secondary | ICD-10-CM

## 2015-12-27 DIAGNOSIS — M797 Fibromyalgia: Secondary | ICD-10-CM

## 2015-12-27 DIAGNOSIS — F69 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: Secondary | ICD-10-CM

## 2015-12-27 DIAGNOSIS — B349 Viral infection, unspecified: Secondary | ICD-10-CM

## 2015-12-27 DIAGNOSIS — M79652 Pain in left thigh: Secondary | ICD-10-CM

## 2015-12-27 DIAGNOSIS — E1142 Type 2 diabetes mellitus with diabetic polyneuropathy: Secondary | ICD-10-CM

## 2015-12-27 DIAGNOSIS — M79651 Pain in right thigh: Secondary | ICD-10-CM | POA: Insufficient documentation

## 2015-12-27 DIAGNOSIS — R21 Rash and other nonspecific skin eruption: Secondary | ICD-10-CM

## 2015-12-27 MED ORDER — TRIAMCINOLONE ACETONIDE 0.5 % EX OINT: 1.0000 "application " | TOPICAL_OINTMENT | Freq: Two times a day (BID) | CUTANEOUS | 0 refills | Status: DC

## 2015-12-27 MED ORDER — CYCLOBENZAPRINE HCL 10 MG PO TABS: 10.0000 mg | ORAL_TABLET | Freq: Three times a day (TID) | ORAL | 2 refills | Status: DC | PRN

## 2015-12-27 MED ORDER — METHYLPREDNISOLONE ACETATE 40 MG/ML IJ SUSP
40.0000 mg | Freq: Once | INTRAMUSCULAR | Status: AC
  Administered 2015-12-27: 40 mg via INTRAMUSCULAR

## 2015-12-27 NOTE — Assessment & Plan Note (Signed)
Patient presents with signs/symptoms of viral illness -conservative therapy discussed

## 2015-12-27 NOTE — Assessment & Plan Note (Signed)
Patient with bilateral lateral thigh pain. Difficult history and exam (see note). Most tender over left greater trochanter and clinically suspect greater trochanteric bursitis. Differential included Fibromyalgia Pain, radicular pain from lumbar spine, and statin induced myopathy.  -provided steroid injection in office -continue Flexeril and Lyrica for fibromyalgia -if persists consider decreasing dose of statin

## 2015-12-27 NOTE — Assessment & Plan Note (Signed)
2 cm by 2 cm rash of right posterior leg consistent with contact dermatitis. -prescription of triamcinolone cream provided -if does not resolve consider biopsy

## 2015-12-27 NOTE — Patient Instructions (Signed)
It was nice to see you today.  Rash on leg - start Triamcinolone cream, if rash persists please return to the office  Leg pain - likely related to fibromyalgia, you were given a steroid injection for treatment of possible trochanteric bursitis, continue Lyrica and Flexeril.   Cough - you have a virus, this will get better with time.   Please schedule your mammogram.

## 2015-12-27 NOTE — Assessment & Plan Note (Signed)
Provided information on how to obtain mammogram.

## 2015-12-27 NOTE — Assessment & Plan Note (Signed)
Controlled based on most recent A1C. Foot exam completed today.  -continue diet control -recheck A1C in February 2018

## 2015-12-27 NOTE — Progress Notes (Signed)
   Subjective:    Patient ID: Kristine Atkinson, female    DOB: [birth year], 60 y.o.   MRN: [id number]  HPI 60 y/o female presents for routine follow up. History is difficult to obtain as patient jumps from topic to topic quickly (flight of ideas), Often does not answer the question that is asked.   Type 2 DM Currently diet controlled, Does not check blood sugars, reports callus on right foot, no ulcerations, no regular exercise  Cough/runny nose 1 week history, no fevers, productive cough  Right calf rash Still itches, has been putting hydrocortisone steroid cream on the area which helps  Thigh/Hip pain in the setting of fibromyalgia Bilateral thigh pain, present for unclear period of time , some improvement with Lyrica and Flexeril, associated left lateral hip pain that radiates to leg, associated back pain.   HM Due for foot exam, mammogram, and eye exam  Psych Patient follows at [facility]. Prescribed Seroquel, Prozac, and Cogentin   Review of Systems See above    Objective:   Physical Exam BP 134/68   Pulse 94   Temp 98.2 F (36.8 C) (Oral)   Ht 5\' 2"  (1.575 m)   Wt 222 lb 3.2 oz (100.8 kg)   BMI 40.64 kg/m    MSK physical difficult to obtain as patient tender in multiple locations, has difficulty answering directly if she has pain on palpation  Gen: pleasant female, African American, accompanied by husband HEENT:normocephalic, PERRL, EOMI, TM's pearly grey bilaterally, nasal septum midline, no rhinorrhea, MMM, no pharyngeal erythema Cardiac: RRR, S1 and S2 present, no murmur Resp: CTAB,  Normal effort Skin: 2 cm by 2 cm hyperpigmented excoriated rash on posterior right calf MSK: Tenderness over multiple locations on palpation (bilateral paraspinal lower back, bilateral lateral thigh), most tender over left greater trochanter Neuro: normal gait, strength 5/5 in lower extremities bilaterally, sensation to light touch intact bilateral LE Foot exam: 2+ DP pulses, no  ulcerations, callus over left MTP, decreased sensation to monofilament testing Psych: dressed appropriately, flight of ideas, pressured speech  Procedure Note: Greater Trochanter Injection (Left) Written and verbal consent obtained. Discussed the risks and benefits of the procedure. Left lateral hip/thight prepped in sterile fashion. Using a 25 gauge 1.5 inch needle 40 mg DepoMedrol and 4 cc Lidocaine without epinephrine was injected in the greater trochanteric space. Patient tolerated well. No complications. Hemostasis achieved. Bandage applied.      Assessment & Plan:  Diabetes type 2, controlled Controlled based on most recent A1C. Foot exam completed today.  -continue diet control -recheck A1C in February 2018  Viral illness Patient presents with signs/symptoms of viral illness -conservative therapy discussed  Rash 2 cm by 2 cm rash of right posterior leg consistent with contact dermatitis. -prescription of triamcinolone cream provided -if does not resolve consider biopsy  Bilateral thigh pain Patient with bilateral lateral thigh pain. Difficult history and exam (see note). Most tender over left greater trochanter and clinically suspect greater trochanteric bursitis. Differential included Fibromyalgia Pain, radicular pain from lumbar spine, and statin induced myopathy.  -provided steroid injection in office -continue Flexeril and Lyrica for fibromyalgia -if persists consider decreasing dose of statin  Health care maintenance Provided information on how to obtain mammogram.   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 Follows with [facility]. If patient continues to present in a similar fashion will need to consider direct admission/IVC.

## 2015-12-27 NOTE — Assessment & Plan Note (Signed)
Almost manic today likely due to uncontrolled pain. Follows with [facility]. If patient continues to present in a similar fashion will need to consider direct admission/IVC.

## 2016-01-09 ENCOUNTER — Other Ambulatory Visit: Payer: Self-pay | Admitting: Licensed Clinical Social Worker

## 2016-01-23 ENCOUNTER — Other Ambulatory Visit: Payer: Self-pay | Admitting: Family Medicine

## 2016-01-23 DIAGNOSIS — M797 Fibromyalgia: Secondary | ICD-10-CM

## 2016-01-23 NOTE — Telephone Encounter (Signed)
Needs lyrica refilled. Needs to send the RX to [facility]. Per pt, dr Ree Kida has the information.

## 2016-01-27 MED ORDER — PREGABALIN 225 MG PO CAPS: 225.0000 mg | ORAL_CAPSULE | Freq: Two times a day (BID) | ORAL | 1 refills | Status: DC

## 2016-01-27 NOTE — Telephone Encounter (Signed)
Lyrica prescription printed and faxed to [facility].

## 2016-01-28 ENCOUNTER — Ambulatory Visit (INDEPENDENT_AMBULATORY_CARE_PROVIDER_SITE_OTHER): Payer: Self-pay | Admitting: Licensed Clinical Social Worker

## 2016-01-28 DIAGNOSIS — F3289 Other specified depressive episodes: Secondary | ICD-10-CM
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
## 2016-02-21 ENCOUNTER — Other Ambulatory Visit: Payer: Self-pay | Admitting: Licensed Clinical Social Worker

## 2016-02-24 ENCOUNTER — Ambulatory Visit: Payer: Self-pay | Admitting: Licensed Clinical Social Worker

## 2016-02-24 DIAGNOSIS — F3289 Other specified depressive episodes: Secondary | ICD-10-CM

## 2016-02-25 NOTE — Progress Notes (Signed)
   THERAPY PROGRESS NOTE  Session Time: 45min  Participation Level: Active  Behavioral Response: CasualAlertDepressed  Type of Therapy: Individual Therapy  Treatment Goals addressed: Coping  Interventions: Strength-based and Supportive  Summary: Kristine Atkinson is a 61 y.o. female who presents with a slightly depressed mood and appropriate affect. [name] shared that she is doing better than previously, as she is not as upset about her family dynamics as she used to be. She expressed the sadness that she feels in not having a relationship with any of her adult children. She stated that she would like LCSW help in writing them a letter; she decided that she will think about what she wants to say and complete the letter next session. [name] also shared fears about what would happen to her in the event of her husband's death. She stated that he is the only supportive person in her life and that if she didn't have him, she would have to be "put into a rest home."    Suicidal/Homicidal: Nowithout intent/plan  Therapist Response: LCSW utilized supportive counseling techniques throughout the session in order to validate emotions and encourage open expression of emotion. LCSW and [name] processed about her emotions towards her adult children. LCSW used CBT skills to help [name] see her children's behavior from another perspective.  Plan: Return again in 2 weeks.  Diagnosis: Axis I: See current hospital problem list    Axis II: No diagnosis    Metta Clines, LCSW 02/25/2016

## 2016-03-09 NOTE — Addendum Note (Signed)
Addended by: Metta Clines on: 03/09/2016 02:50 PM   Modules accepted: Level of Service

## 2016-03-09 NOTE — Addendum Note (Signed)
Addended by: Metta Clines on: 03/09/2016 02:47 PM   Modules accepted: Level of Service

## 2016-03-11 ENCOUNTER — Other Ambulatory Visit: Payer: Self-pay | Admitting: Licensed Clinical Social Worker

## 2016-03-16 ENCOUNTER — Ambulatory Visit (INDEPENDENT_AMBULATORY_CARE_PROVIDER_SITE_OTHER): Payer: Self-pay | Admitting: Licensed Clinical Social Worker

## 2016-03-16 DIAGNOSIS — F3289 Other specified depressive episodes: Secondary | ICD-10-CM
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
## 2016-03-20 ENCOUNTER — Other Ambulatory Visit: Payer: Self-pay | Admitting: Family Medicine

## 2016-03-20 DIAGNOSIS — R21 Rash and other nonspecific skin eruption: Secondary | ICD-10-CM

## 2016-03-24 ENCOUNTER — Other Ambulatory Visit: Payer: Self-pay | Admitting: *Deleted

## 2016-03-24 DIAGNOSIS — I10 Essential (primary) hypertension: Secondary | ICD-10-CM

## 2016-03-24 MED ORDER — OLMESARTAN MEDOXOMIL 20 MG PO TABS: 20.0000 mg | ORAL_TABLET | Freq: Every day | ORAL | 1 refills | Status: DC

## 2016-03-25 ENCOUNTER — Other Ambulatory Visit: Payer: Self-pay | Admitting: Licensed Clinical Social Worker

## 2016-04-08 ENCOUNTER — Ambulatory Visit (INDEPENDENT_AMBULATORY_CARE_PROVIDER_SITE_OTHER): Payer: Self-pay | Admitting: Licensed Clinical Social Worker

## 2016-04-08 DIAGNOSIS — F3289 Other specified depressive episodes: Secondary | ICD-10-CM

## 2016-04-09 NOTE — Progress Notes (Signed)
   THERAPY PROGRESS NOTE  Session Time: 67min  Participation Level: Active  Behavioral Response: CasualAlertDepressed  Type of Therapy: Family Therapy  Treatment Goals addressed: Coping  Interventions: Supportive and Other: EMDR  Summary: Kristine Atkinson is a 61 y.o. female who presents with a depressed, anxious mood and appropriate affect. Her husband Mr Tabares attended the session at Mrs Gouger's request. She reported that she has been very upset lately because her daughter has not written her back or called. She expressed despair that her daughter might never be a part of her life. Mr Camberos encouraged her to "give it to God." Mrs Whittenberg requested to do EMDR for session to tackle her feelings of anger towards her mother for keeping her children out of her life. She identified her negative core belief as "I'm worthless" and her desired positive cognition as "I'm worthy." She reported her level of distress at a 10 out of 10. She then engaged in multiple rounds of bilateral stimulation to desensitize the distress and install the desired positive cognition. After the stimulation, Ms Pantaleon reported that her leave of distress was a 0 out of 10. She shared that she felt much better.   Suicidal/Homicidal: Nowithout intent/plan  Therapist Response: LCSW utilized supportive counseling techniques throughout the session in order to validate emotions and encourage open expression of emotion. LCSW engaged Ms Haydon in EMDR restricted processing (EMD). LCSW assisted Ms Hillman in identifying the presenting problem, the negative core belief associated with the problem, and the desired positive cognition. LCSW scaled the Validity of Positive Belief and Subjective Unit of Disturbance and completed a body scan. LCSW then completed multiple sets of bilateral stimulation for desensitization of the negative belief, and multiple sets of bilateral stimulation for installation of the positive cognition. LCSW and Ms  Giacobbe processed about the experience.  Plan: Return again in 3 weeks.  Diagnosis: Axis I: See current hospital problem list    Axis II: No diagnosis    Metta Clines, LCSW 04/09/2016

## 2016-04-28 ENCOUNTER — Other Ambulatory Visit: Payer: Self-pay | Admitting: Licensed Clinical Social Worker

## 2016-05-15 ENCOUNTER — Ambulatory Visit (INDEPENDENT_AMBULATORY_CARE_PROVIDER_SITE_OTHER): Payer: Self-pay | Admitting: Licensed Clinical Social Worker

## 2016-05-15 DIAGNOSIS — F3289 Other specified depressive episodes: Secondary | ICD-10-CM

## 2016-05-20 NOTE — Progress Notes (Signed)
   THERAPY PROGRESS NOTE  Session Time: 58min  Participation Level: Active  Behavioral Response: CasualAlertEuthymic  Type of Therapy: Individual Therapy  Treatment Goals addressed: Coping  Interventions: Strength-based and Supportive  Summary: Kristine Atkinson is a 61 y.o. female who presents with a euthymic mood and appropriate affect. She appeared more positive and upbeat than in previous sessions. She reported that she felt she was doing better with her depression although she continues to struggle when she is alone. She shared that when she is alone, she tends to have more negative thinking patterns. [name] shared that she is still hurt that her adult daughter never responded to her letter, but she feels better having written it. She requested LCSW support in obtaining and understanding the [facility] financial assistance application. She reported that she may want to change her primary care provider if she cannot receive financial assistance.  Suicidal/Homicidal: Nowithout intent/plan  Therapist Response: LCSW utilized supportive counseling techniques throughout the session in order to validate emotions and encourage open expression of emotion. LCSW and [name] processed about her current stressors and brainstormed how she can better handle being alone. LCSW helped her to obtain and review a [facility] financial assistance application.  Plan: Return again in 2 weeks.    Metta Clines, LCSW 05/20/2016

## 2016-05-28 ENCOUNTER — Other Ambulatory Visit: Payer: Self-pay | Admitting: Family Medicine

## 2016-05-28 DIAGNOSIS — M797 Fibromyalgia: Secondary | ICD-10-CM

## 2016-05-29 ENCOUNTER — Ambulatory Visit (INDEPENDENT_AMBULATORY_CARE_PROVIDER_SITE_OTHER): Payer: Self-pay | Admitting: Licensed Clinical Social Worker

## 2016-05-29 DIAGNOSIS — F3289 Other specified depressive episodes: Secondary | ICD-10-CM

## 2016-06-01 ENCOUNTER — Ambulatory Visit (INDEPENDENT_AMBULATORY_CARE_PROVIDER_SITE_OTHER): Payer: Self-pay | Admitting: Internal Medicine

## 2016-06-01 ENCOUNTER — Encounter: Payer: Self-pay | Admitting: Internal Medicine

## 2016-06-01 DIAGNOSIS — F329 Major depressive disorder, single episode, unspecified: Secondary | ICD-10-CM

## 2016-06-01 DIAGNOSIS — M797 Fibromyalgia: Secondary | ICD-10-CM

## 2016-06-01 DIAGNOSIS — K219 Gastro-esophageal reflux disease without esophagitis: Secondary | ICD-10-CM

## 2016-06-01 DIAGNOSIS — I10 Essential (primary) hypertension: Secondary | ICD-10-CM

## 2016-06-01 DIAGNOSIS — F419 Anxiety disorder, unspecified: Secondary | ICD-10-CM

## 2016-06-01 MED ORDER — HYDROCHLOROTHIAZIDE 25 MG PO TABS: ORAL_TABLET | ORAL | 3 refills | Status: DC

## 2016-06-01 MED ORDER — CARVEDILOL 3.125 MG PO TABS: 3.1250 mg | ORAL_TABLET | Freq: Two times a day (BID) | ORAL | 3 refills | Status: DC

## 2016-06-01 MED ORDER — ATORVASTATIN CALCIUM 80 MG PO TABS: ORAL_TABLET | ORAL | 3 refills | Status: DC

## 2016-06-01 MED ORDER — CYCLOBENZAPRINE HCL 10 MG PO TABS: 10.0000 mg | ORAL_TABLET | Freq: Three times a day (TID) | ORAL | 2 refills | Status: DC | PRN

## 2016-06-01 MED ORDER — LOSARTAN POTASSIUM 50 MG PO TABS: 50.0000 mg | ORAL_TABLET | Freq: Every day | ORAL | 3 refills | Status: DC

## 2016-06-01 MED ORDER — OMEPRAZOLE 20 MG PO CPDR: 20.0000 mg | DELAYED_RELEASE_CAPSULE | Freq: Two times a day (BID) | ORAL | 3 refills | Status: DC

## 2016-06-01 NOTE — Progress Notes (Signed)
Subjective:    Patient ID: Kristine Atkinson, female    DOB: 08-Sep-1955, 61 y.o.   MRN: [id number]  HPI  Has been followed here by Macie Burows, LCSW Here to establish primary care with me.  1.  Essential Hypertension:  Diagnosed about 2008. Benicar last filled in March.  Not clear if she is taking daily.  States Benicar was cut off from MAP and she was not able to refill. Also, was not able to get her HCTZ 25 mg as well.  Has been off HCTZ for maybe 2 weeks. Appears to have also been taking Carvedilol 3.125 mg twice daily for her "pulse"    2.  Hyperlipidemia:   Taking Atorvastatin 40 mg daily.  Has been off two weeks.   3.  Fibromyalgia:  Taking Lyrica and Cyclobenzaprine.  They get Lyrica through [facility] with [facility].  She has one more 90 day refill on current prescription.  4.  Anxiety/Depression/ not clear if Bipolar Disorder.  Was getting counseling with Macie Burows, LCSW here as well.  Has been working with Macie Burows, LCSW for about 1 year.  Followed by Dr. Darcey Nora.  5.  GERD:  Gas.  Needs her Omeprazole    Current Meds  Medication Sig  . aspirin 81 MG tablet Take 1 tablet (81 mg total) by mouth daily.  Marland Kitchen atorvastatin (LIPITOR) 40 MG tablet Take 1 tablet (40 mg total) by mouth daily.  . benztropine (COGENTIN) 1 MG tablet Take 0.5 tablets (0.5 mg total) by mouth daily. (Patient taking differently: Take 0.5 mg by mouth 2 (two) times daily. )  . Capsaicin 0.1 % CREA Apply to affected areas as needed twice daily.  . carvedilol (COREG) 3.125 MG tablet Take 1 tablet (3.125 mg total) by mouth 2 (two) times daily.  . cyclobenzaprine (FLEXERIL) 10 MG tablet Take 1 tablet (10 mg total) by mouth 3 (three) times daily as needed for muscle spasms.  Marland Kitchen FLUoxetine (PROZAC) 20 MG capsule Take 20 mg by mouth. 3 by mouth in the morning  . hydrochlorothiazide (HYDRODIURIL) 25 MG tablet Take 1 tablet (25 mg total) by mouth daily.  Marland Kitchen ibuprofen (ADVIL,MOTRIN) 600 MG tablet Take 1 tablet (600 mg total)  by mouth every 8 (eight) hours as needed.  Marland Kitchen olmesartan (BENICAR) 20 MG tablet Take 1 tablet (20 mg total) by mouth at bedtime.  Marland Kitchen omeprazole (PRILOSEC) 20 MG capsule Take 1 capsule (20 mg total) by mouth 2 (two) times daily.  . pregabalin (LYRICA) 225 MG capsule Take 1 capsule (225 mg total) by mouth 2 (two) times daily.  . QUEtiapine (SEROQUEL) 100 MG tablet Take 200 mg by mouth at bedtime.  Marland Kitchen Spacer/Aero-Holding Chambers (E-Z SPACER) inhaler Use as instructed    Allergies  Allergen Reactions  . Garlic Itching  . Penicillins Other (See Comments)    Itching     Review of Systems     Objective:   Physical Exam  NAD HEENT:  PERRL, EOMI Neck:  Supple, No adenopathy Chest:  CTA CV:  RRR with normal S1 and S2, No S3, S4 with grade 2/6 SEM radiating into carotids.  Carotid, radial pulses normal and equal Abd:  S, NT, No HSM or mass, + BS LE:  No edema        Assessment & Plan:  1.  Essential Hypertension;  Switch to Losartan 50 mg daily.  Restart Carvedilol 3.125 mg twice daily and HCTZ 25 mg once daily  2.  Hyperlipidemia:  Atorvastatin 80  mg 1/2 tab daily with evening meal  3.  Fibromyalgia:  Continue Cyclobenzaprine and Lyrica 225 mg twice daily.  4. ?Bipolar disorder vs Anxiety/Depression:  As per Dr. Isabell Jarvis  5.  GERD: Omeprazole refilled.   Follow up 2 months.
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
## 2016-06-18 ENCOUNTER — Telehealth: Payer: Self-pay | Admitting: Licensed Clinical Social Worker

## 2016-06-18 NOTE — Telephone Encounter (Signed)
SW left message for Laveda Norman regarding scheduling an appointment.

## 2016-06-30 ENCOUNTER — Ambulatory Visit (INDEPENDENT_AMBULATORY_CARE_PROVIDER_SITE_OTHER): Payer: Self-pay | Admitting: Licensed Clinical Social Worker

## 2016-06-30 DIAGNOSIS — F32A Depression, unspecified: Secondary | ICD-10-CM

## 2016-06-30 DIAGNOSIS — F329 Major depressive disorder, single episode, unspecified: Secondary | ICD-10-CM

## 2016-06-30 NOTE — Progress Notes (Signed)
   THERAPY PROGRESS NOTE  Session Time: 20 min.  Participation Level: Active  Behavioral Response: CasualAlertDepressed  Type of Therapy: Individual Therapy  Treatment Goals addressed: Coping  Interventions: Strength-based and Supportive  Summary: BETTYANN BIRCHLER is a 61 y.o. female who presents with depressed mood and appropriate affect. Everette told the [place] that she is now going to be going to [facility]. Shamila shared with the [place] that she was nervous about going to a new doctor. Barnetta Chapel asked the [place] how to renew her [employer].  Arelys stated to the [place] that overall it had been a good week. .   Suicidal/Homicidal: Nowithout intent/plan  Therapist Response: LCSWA utilized supportive counseling techniques throughout the session in order to validate emotions. LCWA explained to Ciana how to renew [facility]. LCSWA processed with Shuna her current stressors and brainstormed how to decrease her anxiousness over going to a new doctor.   Plan: Return again in 2 week.  Diagnosis: Axis I: see current hospital list     Axis XI:DHWY    [facility] Daveigh Batty-LCSWA 06/30/2016

## 2016-07-12 ENCOUNTER — Encounter: Payer: Self-pay | Admitting: Internal Medicine

## 2016-07-17 ENCOUNTER — Ambulatory Visit (INDEPENDENT_AMBULATORY_CARE_PROVIDER_SITE_OTHER): Payer: Self-pay | Admitting: Licensed Clinical Social Worker

## 2016-07-17 DIAGNOSIS — F32A Depression, unspecified: Secondary | ICD-10-CM

## 2016-07-17 DIAGNOSIS — F329 Major depressive disorder, single episode, unspecified: Secondary | ICD-10-CM

## 2016-07-17 NOTE — Progress Notes (Signed)
   THERAPY PROGRESS NOTE  Session Time:30  Participation Level: Active  Behavioral Response: Casual and DisheveledAlertEuthymic  Type of Therapy: Individual Therapy  Treatment Goals addressed: Coping  Interventions: Strength-based and Supportive  Summary: Kristine Atkinson is a 61 y.o. female who presents with euthymic mood and appropriate affect. [name] reported feeling less depressed except when thinking that she is a burden to her husband. [name] shared with the [place] that she wanted to tell her neighbor that she no longer wanted her neighbor to "do her hair" because she wanted to go to a "professional". [name] role played with the LCSWA the conversation she would have with her neighbor. [name] stated that her husband would help her talk to the neighbor if needed. [name] stated to the [place] that she was nervous about her next doctor's appointment because she did not know what to expect. [name] shared with the [place] that she was planning on going to [facility] this week to renew her [employer].   Suicidal/Homicidal: Nowithout intent/plan   Therapist Response: LCSWA utilized supportive counseling techniques throughout the session in order to normalize and validate emotions. LCSWA reminded [name] that [facility] would be leaving mid-August but that [name] could continue to see Bobbye Charleston when [place] returns. LCSWA observed that [name] has a difficult time staying on track.   Plan: Return again in 3 weeks.  Diagnosis: Axis I: see current hospital list     Dorrene German [facility] 07/17/2016

## 2016-07-31 ENCOUNTER — Ambulatory Visit: Payer: Self-pay | Admitting: Internal Medicine

## 2016-08-05 ENCOUNTER — Other Ambulatory Visit: Payer: Self-pay | Admitting: Licensed Clinical Social Worker

## 2016-09-04 ENCOUNTER — Ambulatory Visit: Payer: Self-pay | Admitting: Internal Medicine

## 2016-09-18 ENCOUNTER — Telehealth: Payer: Self-pay | Admitting: Internal Medicine

## 2016-09-18 ENCOUNTER — Ambulatory Visit (INDEPENDENT_AMBULATORY_CARE_PROVIDER_SITE_OTHER): Payer: Self-pay | Admitting: Licensed Clinical Social Worker

## 2016-09-18 DIAGNOSIS — I10 Essential (primary) hypertension: Secondary | ICD-10-CM

## 2016-09-18 DIAGNOSIS — Z599 Problem related to housing and economic circumstances, unspecified: Secondary | ICD-10-CM

## 2016-09-18 DIAGNOSIS — Z598 Other problems related to housing and economic circumstances: Secondary | ICD-10-CM

## 2016-09-18 NOTE — Progress Notes (Signed)
Mrs. Kristine Atkinson and her husband came to visit today very upset about the new orange card and co-pay of $60. LCSW reviewed the financial documents with them, re-calculated their income, and showed them the total. LCSW then reviewed the chart for federal poverty limits that the orange card is based on. LCSW and couple discussed why the co-pay is higher now than previously; namely that Kristine Atkinson is now working and was not previously. Kristine Atkinson shared that she has been very upset about the change in co-pay but still wants to come for counseling sessions. LCSW also provided information about the upcoming [employer] enrollment period. Counseling session scheduled.

## 2016-09-18 NOTE — Telephone Encounter (Signed)
Husband and patient walked in. She is unable to fill her HCTZ and (unknown to her) Carvedilol.  Not clear how long she has been off. Husband states they were chage $20 for the 2 meds from [facility]. Discussed we could switch to [facility] now they have transportation/car. They are in agreement.  Later, realized 3 months of each med was sent out, so would expect to be $20.  They decided to continue with the delivery evefy 3 months.

## 2016-09-28 ENCOUNTER — Ambulatory Visit: Payer: Self-pay | Admitting: Internal Medicine

## 2016-09-29 ENCOUNTER — Encounter: Payer: Self-pay | Admitting: Internal Medicine

## 2016-09-29 ENCOUNTER — Ambulatory Visit (INDEPENDENT_AMBULATORY_CARE_PROVIDER_SITE_OTHER): Payer: Self-pay | Admitting: Internal Medicine

## 2016-09-29 VITALS — BP 138/80 | HR 74 | Resp 12 | Ht 61.5 in | Wt 211.0 lb

## 2016-09-29 DIAGNOSIS — E785 Hyperlipidemia, unspecified: Secondary | ICD-10-CM

## 2016-09-29 DIAGNOSIS — M7062 Trochanteric bursitis, left hip: Secondary | ICD-10-CM

## 2016-09-29 DIAGNOSIS — M797 Fibromyalgia: Secondary | ICD-10-CM

## 2016-09-29 DIAGNOSIS — F329 Major depressive disorder, single episode, unspecified: Secondary | ICD-10-CM

## 2016-09-29 DIAGNOSIS — F419 Anxiety disorder, unspecified: Secondary | ICD-10-CM

## 2016-09-29 DIAGNOSIS — E1143 Type 2 diabetes mellitus with diabetic autonomic (poly)neuropathy: Secondary | ICD-10-CM

## 2016-09-29 DIAGNOSIS — K219 Gastro-esophageal reflux disease without esophagitis: Secondary | ICD-10-CM

## 2016-09-29 DIAGNOSIS — I10 Essential (primary) hypertension: Secondary | ICD-10-CM

## 2016-09-29 DIAGNOSIS — E1142 Type 2 diabetes mellitus with diabetic polyneuropathy: Secondary | ICD-10-CM

## 2016-09-29 MED ORDER — BENZTROPINE MESYLATE 1 MG PO TABS: 1.0000 mg | ORAL_TABLET | Freq: Two times a day (BID) | ORAL | Status: DC

## 2016-09-29 NOTE — Progress Notes (Signed)
Subjective:    Patient ID: Kristine Atkinson, female    DOB: 06-May-1955, 61 y.o.   MRN: [id number]  HPI   1.  Essential Hypertension:  Taking Losartan 50 mg or 1/2 of 100 mg tab daily.  Carvedilol 3.125 mg twice daily.  Having problems with money to pay for HCTZ when delivered to home by [facility] pharmacy.  Not clear how long she has been off the HCTZ--likely a bit less than a month.  2.  Hyperlipidemia:  Taking Atorvastatin 40 mg or 1/2 tab  Of 80 mg tab. nonfasting today   3.  Fibromyalgia:  Brought in the previous paperwork for indigent care fill.  Husband unable to get hold of the company to see if she is still enrolled and on last bottle of 90 day supply.  4.  GERD:  Taking omeprazole 20 mg twice daily.  Controlling GERD symptoms.  5.  Bipolar Disorder:  Has had some med changes.    6.  DM:  A1C last August was 6.8%  She has lost 11 lbs since December of 2017.  Walking regularly in neighborhood. Having lots of burning discomfort in bilateral feet.  Would like medication.  Does not like Gabapentin.  Would be willing to consider Lyrica.  7.  Left ear itches.  Would like me to look at it.    8.  History of greater trochanteric bursitis.  Has had corticosteroid injections in past.  Has not had PT ever.  Bothering her a bit again.  Current Meds  Medication Sig  . aspirin 81 MG tablet Take 1 tablet (81 mg total) by mouth daily.  Marland Kitchen atorvastatin (LIPITOR) 80 MG tablet 1/2 tab by mouth daily  . benztropine (COGENTIN) 1 MG tablet Take 1 tablet (1 mg total) by mouth 2 (two) times daily.  . carvedilol (COREG) 3.125 MG tablet Take 1 tablet (3.125 mg total) by mouth 2 (two) times daily.  . cyclobenzaprine (FLEXERIL) 10 MG tablet Take 1 tablet (10 mg total) by mouth 3 (three) times daily as needed for muscle spasms.  Marland Kitchen FLUoxetine (PROZAC) 20 MG capsule Take 20 mg by mouth. 3 by mouth in the morning  . ibuprofen (ADVIL,MOTRIN) 600 MG tablet Take 1 tablet (600 mg total) by mouth every 8 (eight)  hours as needed.  Marland Kitchen losartan (COZAAR) 50 MG tablet Take 1 tablet (50 mg total) by mouth daily.  Marland Kitchen omeprazole (PRILOSEC) 20 MG capsule Take 1 capsule (20 mg total) by mouth 2 (two) times daily.  . pregabalin (LYRICA) 225 MG capsule Take 1 capsule (225 mg total) by mouth 2 (two) times daily.  . QUEtiapine (SEROQUEL) 300 MG tablet Take 300 mg by mouth at bedtime.  . [DISCONTINUED] benztropine (COGENTIN) 1 MG tablet Take 0.5 tablets (0.5 mg total) by mouth daily. (Patient taking differently: Take 0.5 mg by mouth 2 (two) times daily. )    Allergies  Allergen Reactions  . Garlic Itching  . Penicillins Other (See Comments)    Itching    Review of Systems     Objective:   Physical Exam  NAD HEENT: PERRL, EOMI, TMs pearly gray, canals without cerumen and dry appearing, no erythema. Neck:  Supple, No adenopathy, no thyromegaly Lungs:  CTA CV: RRR without murmur or rub, radial and DP pulses normal and equal Abd: S, NT, No HSM or mass, + BS LE:  No edema.  Quite tender over left greater trochanter        Assessment & Plan:  1.  DM:  Patient nonfasting today, will return for fasting labs in next week:  A1C, urine microalbumin/crea, CMP, CBC  2.  Diabetic Neuropathy:  New Lyrica papers given.  Rx for 50 mg 3 times daily to start.  3.  Hyperlipidemia:  FLP in next week.  4.  Essential Hypertension:  Controlled.  Sending HCTZ and Carvedilol to [facility] as unable to pay for every 3 month meds on delivery to home from [place].  5.  Dry ear canals:  Discussed not cleaning ears of cerumen--need for health of epidermis.  To treat with 50:50 mix of white vinegar and rubbing alcohol--drops in and then out.  6.  Left Greater Trochanter:  [facility] to work on exercises to prevent frequent recurrence.

## 2016-10-02 ENCOUNTER — Ambulatory Visit: Payer: Self-pay | Admitting: Internal Medicine

## 2016-10-05 ENCOUNTER — Other Ambulatory Visit: Payer: Self-pay | Admitting: Licensed Clinical Social Worker

## 2016-10-06 ENCOUNTER — Other Ambulatory Visit: Payer: Self-pay

## 2016-10-06 ENCOUNTER — Encounter: Payer: Self-pay | Admitting: Internal Medicine

## 2016-10-06 MED ORDER — PREGABALIN 50 MG PO CAPS: 50.0000 mg | ORAL_CAPSULE | Freq: Three times a day (TID) | ORAL | 11 refills | Status: DC

## 2016-10-06 MED ORDER — HYDROCHLOROTHIAZIDE 25 MG PO TABS: ORAL_TABLET | ORAL | 11 refills | Status: DC

## 2016-10-06 MED ORDER — CARVEDILOL 3.125 MG PO TABS: 3.1250 mg | ORAL_TABLET | Freq: Two times a day (BID) | ORAL | 11 refills | Status: DC

## 2016-10-09 ENCOUNTER — Telehealth: Payer: Self-pay | Admitting: Internal Medicine

## 2016-10-09 NOTE — Progress Notes (Signed)
Referral printed along with OV notes and demographics. Faxed to [facility]. They will contact patient to schedule appt.

## 2016-10-09 NOTE — Telephone Encounter (Signed)
Patient called stating was returning the call to Dr. Amil Amen to let her know it is ok to leave medication for Kristine Atkinson at the [facility].

## 2016-10-09 NOTE — Telephone Encounter (Signed)
I don't understand this message--please clarify.  Is she saying to send certain refills to [facility]?

## 2016-10-14 NOTE — Telephone Encounter (Signed)
Patient just wanted Dr. Amil Amen to  know  It is ok to send Meds to [facility].

## 2016-10-16 ENCOUNTER — Other Ambulatory Visit (INDEPENDENT_AMBULATORY_CARE_PROVIDER_SITE_OTHER): Payer: Self-pay

## 2016-10-16 DIAGNOSIS — E1142 Type 2 diabetes mellitus with diabetic polyneuropathy: Secondary | ICD-10-CM

## 2016-10-16 DIAGNOSIS — Z79899 Other long term (current) drug therapy: Secondary | ICD-10-CM

## 2016-10-16 DIAGNOSIS — E785 Hyperlipidemia, unspecified: Secondary | ICD-10-CM

## 2016-10-16 NOTE — Progress Notes (Signed)
Patient refused to leave urine sample

## 2016-10-18 LAB — CBC WITH DIFFERENTIAL/PLATELET
BASOS: 1 %
Basophils Absolute: 0 10*3/uL (ref 0.0–0.2)
EOS (ABSOLUTE): 0.2 10*3/uL (ref 0.0–0.4)
EOS: 3 %
HEMATOCRIT: 38.5 % (ref 34.0–46.6)
HEMOGLOBIN: 12.3 g/dL (ref 11.1–15.9)
IMMATURE GRANS (ABS): 0 10*3/uL (ref 0.0–0.1)
IMMATURE GRANULOCYTES: 0 %
LYMPHS: 29 %
Lymphocytes Absolute: 2.1 10*3/uL (ref 0.7–3.1)
MCH: 28.3 pg (ref 26.6–33.0)
MCHC: 31.9 g/dL (ref 31.5–35.7)
MCV: 89 fL (ref 79–97)
MONOCYTES: 8 %
MONOS ABS: 0.6 10*3/uL (ref 0.1–0.9)
NEUTROS PCT: 59 %
Neutrophils Absolute: 4.4 10*3/uL (ref 1.4–7.0)
Platelets: 457 10*3/uL — ABNORMAL HIGH (ref 150–379)
RBC: 4.35 x10E6/uL (ref 3.77–5.28)
RDW: 18.1 % — ABNORMAL HIGH (ref 12.3–15.4)
WBC: 7.3 10*3/uL (ref 3.4–10.8)

## 2016-10-18 LAB — COMPREHENSIVE METABOLIC PANEL
ALBUMIN: 4.1 g/dL (ref 3.5–5.5)
ALK PHOS: 111 IU/L (ref 39–117)
ALT: 9 IU/L (ref 0–32)
AST: 14 IU/L (ref 0–40)
Albumin/Globulin Ratio: 1.5 (ref 1.2–2.2)
BUN / CREAT RATIO: 13 (ref 9–23)
BUN: 16 mg/dL (ref 6–24)
Bilirubin Total: 0.3 mg/dL (ref 0.0–1.2)
CALCIUM: 8.7 mg/dL (ref 8.7–10.2)
CHLORIDE: 106 mmol/L (ref 96–106)
CO2: 23 mmol/L (ref 20–29)
CREATININE: 1.19 mg/dL — AB (ref 0.57–1.00)
GFR calc Af Amer: 58 mL/min/{1.73_m2} — ABNORMAL LOW (ref >59)
GFR, EST NON AFRICAN AMERICAN: 50 mL/min/{1.73_m2} — AB (ref >59)
GLOBULIN, TOTAL: 2.7 g/dL (ref 1.5–4.5)
Glucose: 103 mg/dL — ABNORMAL HIGH (ref 65–99)
POTASSIUM: 4.4 mmol/L (ref 3.5–5.2)
Sodium: 143 mmol/L (ref 134–144)
TOTAL PROTEIN: 6.8 g/dL (ref 6.0–8.5)

## 2016-10-18 LAB — LIPID PANEL W/O CHOL/HDL RATIO
Cholesterol, Total: 185 mg/dL (ref 100–199)
HDL: 41 mg/dL (ref >39)
LDL Calculated: 114 mg/dL — ABNORMAL HIGH (ref 0–99)
Triglycerides: 152 mg/dL — ABNORMAL HIGH (ref 0–149)
VLDL Cholesterol Cal: 30 mg/dL (ref 5–40)

## 2016-10-18 LAB — HGB A1C W/O EAG: HEMOGLOBIN A1C: 6.9 % — AB (ref 4.8–5.6)

## 2016-10-28 ENCOUNTER — Telehealth: Payer: Self-pay | Admitting: Internal Medicine

## 2016-10-28 DIAGNOSIS — K219 Gastro-esophageal reflux disease without esophagitis: Secondary | ICD-10-CM

## 2016-10-28 MED ORDER — CYCLOBENZAPRINE HCL 10 MG PO TABS: 10.0000 mg | ORAL_TABLET | Freq: Three times a day (TID) | ORAL | 2 refills | Status: DC | PRN

## 2016-10-28 MED ORDER — OMEPRAZOLE 20 MG PO CPDR: 20.0000 mg | DELAYED_RELEASE_CAPSULE | Freq: Two times a day (BID) | ORAL | 11 refills | Status: DC

## 2016-10-28 NOTE — Telephone Encounter (Signed)
Per Dr. Amil Amen okay to refill. Cyclobenzaprine sent to [facility] and omperazole sent to [facility]. [facility] notified patient

## 2016-10-28 NOTE — Telephone Encounter (Signed)
Patient spoke with Bobbye Charleston stating needs a Rx on Cyclobenzaprine 10 MG and Omeprazole 20 MG.  To [facility] to Advise.

## 2016-10-28 NOTE — Telephone Encounter (Signed)
Antony Madura called [facility] and gave the information. Bobbye Charleston stated will contact patient.

## 2016-11-13 ENCOUNTER — Other Ambulatory Visit: Payer: Self-pay | Admitting: Licensed Clinical Social Worker

## 2016-11-23 ENCOUNTER — Encounter: Payer: Self-pay | Admitting: Internal Medicine

## 2016-11-23 ENCOUNTER — Ambulatory Visit (INDEPENDENT_AMBULATORY_CARE_PROVIDER_SITE_OTHER): Payer: Self-pay | Admitting: Internal Medicine

## 2016-11-23 VITALS — BP 112/82 | HR 74 | Resp 12 | Ht 61.5 in | Wt 210.0 lb

## 2016-11-23 DIAGNOSIS — Z23 Encounter for immunization: Secondary | ICD-10-CM

## 2016-11-23 DIAGNOSIS — R7989 Other specified abnormal findings of blood chemistry: Secondary | ICD-10-CM

## 2016-11-23 DIAGNOSIS — M79604 Pain in right leg: Secondary | ICD-10-CM

## 2016-11-23 DIAGNOSIS — M79605 Pain in left leg: Secondary | ICD-10-CM

## 2016-11-23 MED ORDER — MELOXICAM 15 MG PO TABS: ORAL_TABLET | ORAL | 11 refills | Status: DC

## 2016-11-23 NOTE — Progress Notes (Signed)
   Subjective:    Patient ID: Kristine Atkinson, female    DOB: 09-13-1955, 61 y.o.   MRN: [id number]  HPI   Initially, patient gives a history of when she was at her [facility] appointment about 30 days ago, she admitted to having suicidal and homicidal thoughts.  The next thing she knew, she was getting 2 shots, reportedly of a medicine she would receive every 3 months that cost $3,000 each time and 30 mg of Abilify.   Patient feels since the shots, she has had increased leg pain.  Started about 1 week after receiving the shots.  Points mainly to her left leg, though states she has the pain in both legs.  States it hurts in her thighs, like someone is pulling apart her legs.  Very difficult to get a history.  She has had chronic pain in her posterior thighs for years, more of an ache, but the pulling sensation in the same area is new.  Her husband has used [employer] to rub down her legs, which generally works, but has not worked this time.     Current Meds  Medication Sig  . aspirin 81 MG tablet Take 1 tablet (81 mg total) by mouth daily.  Marland Kitchen atorvastatin (LIPITOR) 80 MG tablet 1/2 tab by mouth daily  . benztropine (COGENTIN) 1 MG tablet Take 1 tablet (1 mg total) by mouth 2 (two) times daily.  . carvedilol (COREG) 3.125 MG tablet Take 1 tablet (3.125 mg total) by mouth 2 (two) times daily.  . cyclobenzaprine (FLEXERIL) 10 MG tablet Take 1 tablet (10 mg total) by mouth 3 (three) times daily as needed for muscle spasms.  Marland Kitchen FLUoxetine (PROZAC) 20 MG capsule Take 20 mg by mouth. 3 by mouth in the morning  . hydrochlorothiazide (HYDRODIURIL) 25 MG tablet 1 cap in the morning once daily.  Marland Kitchen ibuprofen (ADVIL,MOTRIN) 600 MG tablet Take 1 tablet (600 mg total) by mouth every 8 (eight) hours as needed.  Marland Kitchen losartan (COZAAR) 50 MG tablet Take 1 tablet (50 mg total) by mouth daily.  Marland Kitchen omeprazole (PRILOSEC) 20 MG capsule Take 1 capsule (20 mg total) by mouth 2 (two) times daily.  . pregabalin (LYRICA) 50 MG  capsule Take 1 capsule (50 mg total) by mouth 3 (three) times daily.  . QUEtiapine (SEROQUEL) 300 MG tablet Take 300 mg by mouth at bedtime.   Allergies  Allergen Reactions  . Garlic Itching  . Penicillins Other (See Comments)    Itching    Review of Systems     Objective:   Physical Exam NAD Able to climb up on exam table easily. LE:  Tenderness mainly of posterior thigh musculature.  Not able to clearly define where the tenderness is bilaterally. No erythema or swelling. Consistency of area of pain is lacking. Knees:  Full ROM, no swelling or effusion.  NT over joint lines bilaterally.  Ligament exams intact.       Assessment & Plan:  Bilateral posterior thigh pain:  Inconsistent exam.  Check BMP for electrolyte abnormality and to recheck elevated creatinine.  No ibuprofen or other NSAID other than Meloxicam 15 mg daily with food. Will check into PT at [facility].  Reportedly no afternoon appointments offered any longer.  If that is the case, we will not send her as she cannot get a ride in the morning with Kristine Atkinson.  She has a followup in December already planned.

## 2016-11-24 LAB — BASIC METABOLIC PANEL
BUN / CREAT RATIO: 12 (ref 9–23)
BUN: 13 mg/dL (ref 6–24)
CHLORIDE: 104 mmol/L (ref 96–106)
CO2: 28 mmol/L (ref 20–29)
Calcium: 9.1 mg/dL (ref 8.7–10.2)
Creatinine, Ser: 1.1 mg/dL — ABNORMAL HIGH (ref 0.57–1.00)
GFR, EST AFRICAN AMERICAN: 64 mL/min/{1.73_m2} (ref >59)
GFR, EST NON AFRICAN AMERICAN: 55 mL/min/{1.73_m2} — AB (ref >59)
Glucose: 97 mg/dL (ref 65–99)
POTASSIUM: 4.6 mmol/L (ref 3.5–5.2)
SODIUM: 145 mmol/L — AB (ref 134–144)

## 2016-12-29 ENCOUNTER — Ambulatory Visit: Payer: Self-pay | Admitting: Internal Medicine

## 2017-01-08 ENCOUNTER — Other Ambulatory Visit: Payer: Self-pay | Admitting: Licensed Clinical Social Worker

## 2017-02-03 ENCOUNTER — Ambulatory Visit: Payer: Self-pay | Admitting: Internal Medicine

## 2017-02-12 ENCOUNTER — Ambulatory Visit: Payer: Self-pay | Admitting: Internal Medicine

## 2017-02-16 ENCOUNTER — Other Ambulatory Visit: Payer: Self-pay | Admitting: Internal Medicine

## 2017-02-17 ENCOUNTER — Ambulatory Visit: Payer: Self-pay | Admitting: Internal Medicine

## 2017-02-26 ENCOUNTER — Ambulatory Visit: Payer: Self-pay | Admitting: Internal Medicine

## 2017-03-23 ENCOUNTER — Ambulatory Visit: Payer: Self-pay | Admitting: Internal Medicine

## 2017-04-07 ENCOUNTER — Ambulatory Visit: Payer: Self-pay | Admitting: Internal Medicine

## 2017-04-21 ENCOUNTER — Encounter: Payer: Self-pay | Admitting: Internal Medicine

## 2017-04-21 ENCOUNTER — Ambulatory Visit: Payer: Self-pay | Admitting: Internal Medicine

## 2017-04-21 VITALS — BP 158/102 | HR 70 | Resp 12 | Ht 61.5 in | Wt 212.0 lb

## 2017-04-21 DIAGNOSIS — E1142 Type 2 diabetes mellitus with diabetic polyneuropathy: Secondary | ICD-10-CM

## 2017-04-21 DIAGNOSIS — F5089 Other specified eating disorder: Secondary | ICD-10-CM

## 2017-04-21 DIAGNOSIS — M65312 Trigger thumb, left thumb: Secondary | ICD-10-CM

## 2017-04-21 DIAGNOSIS — I10 Essential (primary) hypertension: Secondary | ICD-10-CM

## 2017-04-21 DIAGNOSIS — E785 Hyperlipidemia, unspecified: Secondary | ICD-10-CM

## 2017-04-21 LAB — GLUCOSE, POCT (MANUAL RESULT ENTRY): POC Glucose: 140 mg/dl — AB (ref 70–99)

## 2017-04-21 MED ORDER — ATORVASTATIN CALCIUM 80 MG PO TABS: ORAL_TABLET | ORAL | 11 refills | Status: DC

## 2017-04-21 MED ORDER — CARVEDILOL 3.125 MG PO TABS: 3.1250 mg | ORAL_TABLET | Freq: Two times a day (BID) | ORAL | 11 refills | Status: DC

## 2017-04-21 MED ORDER — HYDROCHLOROTHIAZIDE 25 MG PO TABS: ORAL_TABLET | ORAL | 11 refills | Status: DC

## 2017-04-21 MED ORDER — LOSARTAN POTASSIUM 50 MG PO TABS: 50.0000 mg | ORAL_TABLET | Freq: Every day | ORAL | 3 refills | Status: DC

## 2017-04-21 NOTE — Progress Notes (Signed)
   Subjective:    Patient ID: Kristine Atkinson, female    DOB: Aug 14, 1955, 62 y.o.   MRN: [id number]  HPI   1.  Left thumb hurts for 2.5 weeks.  She shows me how it triggers and states getting more sore.  2.  Hyperlipidemia:  Did not increase her Atorvastatin to 80 mg, still taking 40 mg.  Did not come in for recheck of cholesterol as well.  3.  She has been having cravings for red dirt, but ate [place] instead.  Has been eating small amounts for 2 weeks.  Not chewing ice.  No corn starch. No melena or hematochezia.    4.  Essential Hypertension:  Taking all 3 of bp meds:  Carvedilol, HCTZ, Losartan.  Her bp was well controlled on these previously.  Current Meds  Medication Sig  . ARIPiprazole Lauroxil ER (ARISTADA) 441 MG/1.6ML PRSY Inject into the muscle. Every 4 weeks or once a month  . aspirin 81 MG tablet Take 1 tablet (81 mg total) by mouth daily.  Marland Kitchen atorvastatin (LIPITOR) 80 MG tablet 1/2 tab by mouth daily  . benztropine (COGENTIN) 1 MG tablet Take 1 tablet (1 mg total) by mouth 2 (two) times daily.  . carvedilol (COREG) 3.125 MG tablet Take 1 tablet (3.125 mg total) by mouth 2 (two) times daily.  . cyclobenzaprine (FLEXERIL) 10 MG tablet TAKE 1 TABLET BY MOUTH THREE TIMES A DAY AS NEEDED FOR MUSCLE SPASM  . hydrochlorothiazide (HYDRODIURIL) 25 MG tablet 1 cap in the morning once daily.  Marland Kitchen losartan (COZAAR) 50 MG tablet Take 1 tablet (50 mg total) by mouth daily.  . meloxicam (MOBIC) 15 MG tablet 1 tab by mouth daily with food for leg pain  . omeprazole (PRILOSEC) 20 MG capsule Take 1 capsule (20 mg total) by mouth 2 (two) times daily.  . pregabalin (LYRICA) 50 MG capsule Take 1 capsule (50 mg total) by mouth 3 (three) times daily.  . traZODone (DESYREL) 100 MG tablet Take 100 mg by mouth at bedtime.  Marland Kitchen venlafaxine (EFFEXOR) 37.5 MG tablet Take 37.5 mg by mouth 2 (two) times daily. 1 every morning    Allergies  Allergen Reactions  . Garlic Itching  . Penicillins Other (See  Comments)    Itching    Review of Systems     Objective:   Physical Exam NAD HEENT:  PERRL, EOMI, palpebral conjunctivae without paleness Throat without injection. Neck:  Supple, no adenopathy, no thyromegaly Chest:  CTA CV:  RRR without murmur or rub, radial and DP pulses normal and equal MS: Tender over palmar aspect of thumb MCP area on left.  Feel pulley causing mild triggering here.      Assessment & Plan:  Moving meds from [facility] to [place]. Suspect she has been missing some meds.  1.  Trigger left thumb:  To pick up spica splint.  Consider injection if does not improver.  2.  Essential Hypertension:  Recheck bp in 6 weeks in follow up of labs  3.  Hyperlipidemia:  FLP  with CMP in 6 week.    4.  Pica:  Check cbc today  5.  DM:  A1C in 6 weeks.

## 2017-04-21 NOTE — Patient Instructions (Signed)
Go to [facility] or [facility] for a spica splint for the left hand/thumb if you are unable to find at [facility]. Wear all the time except when washing hands or bathing for at least 2 weeks.   Call if that does not take care of the triggering.

## 2017-04-22 LAB — CBC WITH DIFFERENTIAL/PLATELET
Basophils Absolute: 0 10*3/uL (ref 0.0–0.2)
Basos: 0 %
EOS (ABSOLUTE): 0.2 10*3/uL (ref 0.0–0.4)
EOS: 2 %
HEMATOCRIT: 41.2 % (ref 34.0–46.6)
HEMOGLOBIN: 13.4 g/dL (ref 11.1–15.9)
IMMATURE GRANS (ABS): 0 10*3/uL (ref 0.0–0.1)
IMMATURE GRANULOCYTES: 0 %
LYMPHS: 34 %
Lymphocytes Absolute: 3.5 10*3/uL — ABNORMAL HIGH (ref 0.7–3.1)
MCH: 27.8 pg (ref 26.6–33.0)
MCHC: 32.5 g/dL (ref 31.5–35.7)
MCV: 86 fL (ref 79–97)
MONOCYTES: 9 %
Monocytes Absolute: 0.9 10*3/uL (ref 0.1–0.9)
NEUTROS PCT: 55 %
Neutrophils Absolute: 5.7 10*3/uL (ref 1.4–7.0)
Platelets: 434 10*3/uL — ABNORMAL HIGH (ref 150–379)
RBC: 4.82 x10E6/uL (ref 3.77–5.28)
RDW: 16.3 % — ABNORMAL HIGH (ref 12.3–15.4)
WBC: 10.3 10*3/uL (ref 3.4–10.8)

## 2017-05-31 ENCOUNTER — Other Ambulatory Visit: Payer: Self-pay

## 2017-05-31 DIAGNOSIS — K219 Gastro-esophageal reflux disease without esophagitis: Secondary | ICD-10-CM

## 2017-05-31 MED ORDER — OMEPRAZOLE 20 MG PO CPDR: 20.0000 mg | DELAYED_RELEASE_CAPSULE | Freq: Two times a day (BID) | ORAL | 5 refills | Status: DC

## 2017-06-03 ENCOUNTER — Other Ambulatory Visit: Payer: Self-pay

## 2017-06-09 ENCOUNTER — Ambulatory Visit: Payer: Self-pay | Admitting: Internal Medicine

## 2017-06-10 ENCOUNTER — Other Ambulatory Visit: Payer: Self-pay

## 2017-06-10 DIAGNOSIS — K219 Gastro-esophageal reflux disease without esophagitis: Secondary | ICD-10-CM

## 2017-06-10 MED ORDER — OMEPRAZOLE 20 MG PO CPDR: 20.0000 mg | DELAYED_RELEASE_CAPSULE | Freq: Two times a day (BID) | ORAL | 5 refills | Status: DC

## 2017-06-25 ENCOUNTER — Other Ambulatory Visit: Payer: Self-pay

## 2017-06-25 DIAGNOSIS — E1142 Type 2 diabetes mellitus with diabetic polyneuropathy: Secondary | ICD-10-CM

## 2017-06-25 DIAGNOSIS — E785 Hyperlipidemia, unspecified: Secondary | ICD-10-CM

## 2017-06-25 DIAGNOSIS — Z79899 Other long term (current) drug therapy: Secondary | ICD-10-CM

## 2017-06-26 LAB — COMPREHENSIVE METABOLIC PANEL
A/G RATIO: 1.5 (ref 1.2–2.2)
ALBUMIN: 4.2 g/dL (ref 3.6–4.8)
ALK PHOS: 119 IU/L — AB (ref 39–117)
ALT: 12 IU/L (ref 0–32)
AST: 17 IU/L (ref 0–40)
BUN/Creatinine Ratio: 12 (ref 12–28)
BUN: 12 mg/dL (ref 8–27)
Bilirubin Total: <0.2 mg/dL (ref 0.0–1.2)
CO2: 25 mmol/L (ref 20–29)
CREATININE: 1.04 mg/dL — AB (ref 0.57–1.00)
Calcium: 9.5 mg/dL (ref 8.7–10.3)
Chloride: 94 mmol/L — ABNORMAL LOW (ref 96–106)
GFR calc Af Amer: 67 mL/min/{1.73_m2} (ref >59)
GFR calc non Af Amer: 59 mL/min/{1.73_m2} — ABNORMAL LOW (ref >59)
GLOBULIN, TOTAL: 2.8 g/dL (ref 1.5–4.5)
Glucose: 235 mg/dL — ABNORMAL HIGH (ref 65–99)
POTASSIUM: 4.1 mmol/L (ref 3.5–5.2)
SODIUM: 136 mmol/L (ref 134–144)
Total Protein: 7 g/dL (ref 6.0–8.5)

## 2017-06-26 LAB — LIPID PANEL W/O CHOL/HDL RATIO
CHOLESTEROL TOTAL: 132 mg/dL (ref 100–199)
HDL: 37 mg/dL — AB (ref >39)
LDL Calculated: 62 mg/dL (ref 0–99)
TRIGLYCERIDES: 165 mg/dL — AB (ref 0–149)
VLDL CHOLESTEROL CAL: 33 mg/dL (ref 5–40)

## 2017-06-26 LAB — HGB A1C W/O EAG: Hgb A1c MFr Bld: 8.3 % — ABNORMAL HIGH (ref 4.8–5.6)

## 2017-06-28 ENCOUNTER — Other Ambulatory Visit: Payer: Self-pay | Admitting: Internal Medicine

## 2017-06-29 ENCOUNTER — Ambulatory Visit: Payer: Self-pay | Admitting: Internal Medicine

## 2017-07-08 ENCOUNTER — Ambulatory Visit: Payer: Self-pay | Admitting: Internal Medicine

## 2017-07-08 ENCOUNTER — Encounter: Payer: Self-pay | Admitting: Internal Medicine

## 2017-07-08 VITALS — BP 148/82 | HR 68 | Resp 12 | Ht 61.5 in | Wt 213.0 lb

## 2017-07-08 DIAGNOSIS — E785 Hyperlipidemia, unspecified: Secondary | ICD-10-CM

## 2017-07-08 DIAGNOSIS — E1142 Type 2 diabetes mellitus with diabetic polyneuropathy: Secondary | ICD-10-CM

## 2017-07-08 MED ORDER — PREGABALIN 50 MG PO CAPS: 50.0000 mg | ORAL_CAPSULE | Freq: Three times a day (TID) | ORAL | 11 refills | Status: DC

## 2017-07-08 MED ORDER — METFORMIN HCL ER 500 MG PO TB24: ORAL_TABLET | ORAL | 11 refills | Status: DC

## 2017-07-08 NOTE — Progress Notes (Signed)
   Subjective:    Patient ID: Kristine Atkinson, female    DOB: [birth year], 62 y.o.   MRN: [id number]  HPI   1.  DM:  Terrible diet in recent months.  She is walking about 1/2 mile daily.  She is not on any medication for this as was controlling with diet in past 2 years.  She did have a bump up in her Seroquel to 300 mg, but thought this was her dose before.  2.  Hyperlipiedmia:  LDL at goal, but needs to work on diet for rest.  Current Meds  Medication Sig  . ARIPiprazole Lauroxil ER (ARISTADA) 441 MG/1.6ML PRSY Inject into the muscle. Every 4 weeks or once a month  . atorvastatin (LIPITOR) 80 MG tablet 1 tab by mouth daily with evening meal  . benztropine (COGENTIN) 1 MG tablet Take 1 tablet (1 mg total) by mouth 2 (two) times daily.  . carvedilol (COREG) 3.125 MG tablet Take 1 tablet (3.125 mg total) by mouth 2 (two) times daily.  Marland Kitchen FLUoxetine (PROZAC) 20 MG capsule Take 20 mg by mouth. 3 by mouth in the morning  . hydrochlorothiazide (HYDRODIURIL) 25 MG tablet 1 cap in the morning once daily.  Marland Kitchen losartan (COZAAR) 50 MG tablet Take 1 tablet (50 mg total) by mouth daily.  . meloxicam (MOBIC) 15 MG tablet 1 tab by mouth daily with food for leg pain  . omeprazole (PRILOSEC) 20 MG capsule Take 1 capsule (20 mg total) by mouth 2 (two) times daily.  . QUEtiapine (SEROQUEL) 300 MG tablet Take 300 mg by mouth at bedtime.  . traZODone (DESYREL) 100 MG tablet Take 100 mg by mouth at bedtime.  Marland Kitchen venlafaxine (EFFEXOR) 37.5 MG tablet Take 37.5 mg by mouth 2 (two) times daily. 1 every morning   Allergies  Allergen Reactions  . Garlic Itching  . Penicillins Other (See Comments)    Itching    Review of Systems     Objective:   Physical Exam NAD Lungs:  CTA CV:  RRR without murmur or rub.  Radial and DP pulses normal and equal. Abd:  S, NT, No HSM or mass, + BS LE:  No edema.       Assessment & Plan:  1.  DM:  To get back on track with eating and physical activity.   To bring in her  glucometer next week so we can show her how to utilize and have her keep track of her sugars so she knows when she is worsening early on with her sugar control.  A1C in 3 months.  2.  Hyperlipidemia:  FLP in 3 months, follow up with me thereafter to go over.  Continues on Atorvastatin.   3.  HM:  Needs foot exam, influenza vaccine with follow up, guaiac cards x 3 and mammogram set up with next visit.

## 2017-07-08 NOTE — Patient Instructions (Signed)
Bring in glucometer beginning of next week to be shown how to use

## 2017-07-26 ENCOUNTER — Other Ambulatory Visit: Payer: Self-pay | Admitting: Licensed Clinical Social Worker

## 2017-08-09 ENCOUNTER — Other Ambulatory Visit: Payer: Self-pay | Admitting: Licensed Clinical Social Worker

## 2017-08-18 ENCOUNTER — Ambulatory Visit: Payer: Self-pay | Admitting: Licensed Clinical Social Worker

## 2017-08-18 DIAGNOSIS — F329 Major depressive disorder, single episode, unspecified: Secondary | ICD-10-CM

## 2017-08-18 DIAGNOSIS — F32A Depression, unspecified: Secondary | ICD-10-CM
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
## 2017-09-30 ENCOUNTER — Other Ambulatory Visit: Payer: Self-pay | Admitting: Internal Medicine

## 2017-10-08 ENCOUNTER — Other Ambulatory Visit: Payer: Self-pay

## 2017-10-08 DIAGNOSIS — E1142 Type 2 diabetes mellitus with diabetic polyneuropathy: Secondary | ICD-10-CM

## 2017-10-08 DIAGNOSIS — E785 Hyperlipidemia, unspecified: Secondary | ICD-10-CM

## 2017-10-09 LAB — HGB A1C W/O EAG: Hgb A1c MFr Bld: 7.2 % — ABNORMAL HIGH (ref 4.8–5.6)

## 2017-10-09 LAB — LIPID PANEL W/O CHOL/HDL RATIO
Cholesterol, Total: 215 mg/dL — ABNORMAL HIGH (ref 100–199)
HDL: 41 mg/dL (ref >39)
LDL CALC: 144 mg/dL — AB (ref 0–99)
TRIGLYCERIDES: 148 mg/dL (ref 0–149)
VLDL Cholesterol Cal: 30 mg/dL (ref 5–40)

## 2017-10-12 ENCOUNTER — Ambulatory Visit: Payer: Self-pay | Admitting: Internal Medicine

## 2017-10-25 ENCOUNTER — Ambulatory Visit: Payer: Self-pay | Admitting: Internal Medicine

## 2017-11-22 ENCOUNTER — Ambulatory Visit: Payer: Self-pay | Admitting: Internal Medicine

## 2017-12-13 ENCOUNTER — Other Ambulatory Visit: Payer: Self-pay | Admitting: Internal Medicine

## 2017-12-17 ENCOUNTER — Other Ambulatory Visit: Payer: Self-pay | Admitting: Internal Medicine

## 2017-12-17 DIAGNOSIS — K219 Gastro-esophageal reflux disease without esophagitis: Secondary | ICD-10-CM

## 2017-12-31 ENCOUNTER — Ambulatory Visit: Payer: Self-pay | Admitting: Internal Medicine

## 2018-01-24 ENCOUNTER — Ambulatory Visit: Payer: Self-pay | Admitting: Internal Medicine

## 2018-02-24 ENCOUNTER — Other Ambulatory Visit: Payer: Self-pay | Admitting: Internal Medicine

## 2018-02-28 ENCOUNTER — Ambulatory Visit: Payer: Self-pay | Admitting: Internal Medicine

## 2018-03-30 ENCOUNTER — Ambulatory Visit: Payer: Self-pay | Admitting: Internal Medicine

## 2018-04-13 ENCOUNTER — Telehealth: Payer: Self-pay | Admitting: Internal Medicine

## 2018-04-13 NOTE — Telephone Encounter (Signed)
Spoke with husband--Cat handed the phone over to him.  Antony Madura will call to set up an appt in the morning in next 1-2 weeks and cancel appt on the 15th of April in the afternoon.   Until seen, to try Terbinafine cream twice daily to area.

## 2018-04-18 NOTE — Telephone Encounter (Signed)
NOTED

## 2018-04-21 ENCOUNTER — Encounter: Payer: Self-pay | Admitting: Internal Medicine

## 2018-04-21 ENCOUNTER — Other Ambulatory Visit: Payer: Self-pay

## 2018-04-21 ENCOUNTER — Ambulatory Visit: Payer: Self-pay | Admitting: Internal Medicine

## 2018-04-21 VITALS — BP 180/100 | HR 64 | Resp 12 | Ht 61.5 in | Wt 208.0 lb

## 2018-04-21 DIAGNOSIS — I10 Essential (primary) hypertension: Secondary | ICD-10-CM

## 2018-04-21 DIAGNOSIS — B353 Tinea pedis: Secondary | ICD-10-CM

## 2018-04-21 DIAGNOSIS — E1142 Type 2 diabetes mellitus with diabetic polyneuropathy: Secondary | ICD-10-CM

## 2018-04-21 DIAGNOSIS — Z1239 Encounter for other screening for malignant neoplasm of breast: Secondary | ICD-10-CM

## 2018-04-21 DIAGNOSIS — M797 Fibromyalgia: Secondary | ICD-10-CM

## 2018-04-21 DIAGNOSIS — E1143 Type 2 diabetes mellitus with diabetic autonomic (poly)neuropathy: Secondary | ICD-10-CM

## 2018-04-21 DIAGNOSIS — R21 Rash and other nonspecific skin eruption: Secondary | ICD-10-CM

## 2018-04-21 DIAGNOSIS — E785 Hyperlipidemia, unspecified: Secondary | ICD-10-CM

## 2018-04-21 DIAGNOSIS — Z79899 Other long term (current) drug therapy: Secondary | ICD-10-CM

## 2018-04-21 MED ORDER — TRIAMCINOLONE ACETONIDE 0.1 % EX CREA: TOPICAL_CREAM | CUTANEOUS | 1 refills | Status: DC

## 2018-04-21 MED ORDER — ATORVASTATIN CALCIUM 80 MG PO TABS: ORAL_TABLET | ORAL | 3 refills | Status: DC

## 2018-04-21 MED ORDER — HYDROCHLOROTHIAZIDE 25 MG PO TABS: ORAL_TABLET | ORAL | 3 refills | Status: DC

## 2018-04-21 MED ORDER — LOSARTAN POTASSIUM 50 MG PO TABS: 50.0000 mg | ORAL_TABLET | Freq: Every day | ORAL | 3 refills | Status: DC

## 2018-04-21 MED ORDER — CARVEDILOL 3.125 MG PO TABS: 3.1250 mg | ORAL_TABLET | Freq: Two times a day (BID) | ORAL | 3 refills | Status: DC

## 2018-04-21 NOTE — Patient Instructions (Signed)
Call if rash does not resolve  Use Gold Bond Foot Cream mixed with Tea Tree Oil to apply to dry and calloused areas of feet twice daily.  No more than 4 grams=4000 mg of sodium daily  Elevate feet and recline when sitting.   Walk if you are on your feet.  If you are standing, sit and get legs up.  Please draw and color in the different pills you are supposed to be taking on the medicine list we are sending home with you.  Please call in the meds to the clinic by 2 p.m. today.

## 2018-04-21 NOTE — Progress Notes (Signed)
.    Subjective:    Patient ID: Kristine Atkinson, female   DOB: 09/03/1955, 63 y.o.   MRN: [id number]   HPI   Has not been following up. Last visit in 06/2017  1.  Rash on medial ankle for a couple of years.  Seems to be spreading recently. Called in 04/12/2017 and decided to try Terbinafine cream twice daily since.  The itching seems to be better. Not clear if significantly decreased the rash.  She has a dark spot on her medial ankle on the left that itches a bit now and has been applying the terbinafine cream as well.  2.  Hypertension:  Not clear if she is taking all of her meds:  Losartan Carvedilol, HCTZ. She does not read, so unable to compare her med list to her pills.    3.  DM: she does not follow her blood glucose at home.  She has glucometer, just not using. Cannot recall when last had eye exam.   Last A1C was close to goal and improved from earlier in year at 7.2%   Gets all meds at [facility].  Has many meds on her list I suspect she is no longer taking.  4.  HM:  Cannot recall last Mammogram, eye exam.  Did not get influenza vaccine this past year.  5.  Hyperlipidemia:  Not clear she is taking her atorvastatin.  6.  Edema of feet and ankles:  About the same time as the rash on legs has worsened.  No chest pain, dyspnea, PND or orthopnea.  Current Meds  Medication Sig  . ARIPiprazole Lauroxil ER (ARISTADA) 441 MG/1.6ML PRSY Inject into the muscle. Every 4 weeks or once a month  . atorvastatin (LIPITOR) 80 MG tablet 1 tab by mouth daily with evening meal  . benztropine (COGENTIN) 1 MG tablet Take 1 tablet (1 mg total) by mouth 2 (two) times daily.  . carvedilol (COREG) 3.125 MG tablet Take 1 tablet (3.125 mg total) by mouth 2 (two) times daily.  . cyclobenzaprine (FLEXERIL) 10 MG tablet TAKE 1 TABLET BY MOUTH THREE TIMES A DAY AS NEEDED FOR MUSCLE SPASM  . FLUoxetine (PROZAC) 20 MG capsule Take 20 mg by mouth. 3 by mouth in the morning  . hydrochlorothiazide  (HYDRODIURIL) 25 MG tablet 1 cap in the morning once daily.  Marland Kitchen losartan (COZAAR) 50 MG tablet Take 1 tablet (50 mg total) by mouth daily.  . meloxicam (MOBIC) 15 MG tablet TAKE 1 TABLET BY MOUTH DAILY WITH FOOD FOR LEG PAIN  . metFORMIN (GLUCOPHAGE-XR) 500 MG 24 hr tablet 1 tab by mouth twice daily with meals  . omeprazole (PRILOSEC) 20 MG capsule TAKE 1 CAPSULE (20 MG TOTAL) BY MOUTH 2 (TWO) TIMES DAILY.  Marland Kitchen pregabalin (LYRICA) 50 MG capsule Take 1 capsule (50 mg total) by mouth 3 (three) times daily.  . QUEtiapine (SEROQUEL) 300 MG tablet Take 300 mg by mouth at bedtime.  . traZODone (DESYREL) 100 MG tablet Take 100 mg by mouth at bedtime.  Marland Kitchen venlafaxine (EFFEXOR) 37.5 MG tablet Take 37.5 mg by mouth 2 (two) times daily. 1 every morning   Allergies  Allergen Reactions  . Garlic Itching  . Penicillins Other (See Comments)    Itching     Review of Systems    Objective:   BP (!) 180/100 (BP Location: Left Arm, Cuff Size: Large)   Pulse 64   Resp 12   Ht 5' 1.5" (1.562 m)   Wt 208 lb (  94.3 kg)   BMI 38.66 kg/m   Physical Exam  NAD Wearing mask Lungs:  CTA CV:  RRR with normal S1 and S2, No S3, S4 or murmur.  Radial and DP pulses normal and equal LE:  Mild pitting edema to high pretibial area.  She has an oval area of mild inflammation with raised papular crusty areas mainly on outer rim of lesion, but also scattered within.  It does appear to in general have a central clearing that is mildly hyperpigmented.   Faint lesion about the size of half a dime on medial left shin area with mild erythema and one papule.  Diabetic Foot Exam - Simple   Simple Foot Form Diabetic Foot exam was performed with the following findings:  Yes 04/21/2018 10:43 AM  Visual Inspection See comments:  Yes Sensation Testing See comments:  Yes Pulse Check Posterior Tibialis and Dorsalis pulse intact bilaterally:  Yes Comments Patient a difficult reporter.  Not clear what she is feeling with 10 g  monofilament as she just does not reply as needed.  She does feel lateral dorsal area.  It seems she is feeling some of the plantar areas as well as her foot twitches with testing, but she does not give an affirmative reply when tested on plantar surfaces.   Feet with thickened and discolored great toenails and diffuse dryness with light flaking on plantar aspects bilaterally which creeps onto sides of feet.      Assessment & Plan  1.  Rash: possibly tinea corporis responding to Terbinafine.  With history of chronicity, however, more likely eczema.   To continue with Terbinafine twice daily to lesions and add Triamcinolone cream twice daily.  2.  Dry feet, likely with tinea pedis as also with onychomycosis of great nails.   Tea tree oil mixed with Gold bond foot cream apply to feet twice daily.  3.  DM:  A1C, urine microalbumin/crea  4.  Dysplipdemia:  FLP  5.  Hypertension:  Likely not controlled as becomes clear she is at least not taking her HCTZ.  Calling pharmacy to see what she has been picking up over last 3 months.  6.  Edema:  As in #5.  Went over limiting sodium.  Reclining and elevating legs and feet when sitting and keeping track of what meds she should be picking up regularly. Would like them to fill out index card as to what she should be taking.  She should keep one copy in her purse.  Her husband should keep a copy in his wallet.  7.  HM:  Mammogram order sent--will likely be a while before scheduled with pandemic.   Recommend eye exam at [facility] or low cost optometry.  She does not have an orange card.   8.  Mental Health:  Need to find out what she is supposed to be taking for this through [facility].  Addendum:  She is not taking HCTZ, Carvedilol and Atorvastatin since 02/24/2018 fill-reportedly no more refills--which is not correct by our records. Dan Humphreys states she is taking the Effexor XR 75 mg daily, Trazodone 150 mg at hs, Benztropine 1 mg twice daily, which she  has not picked up this month and Aripiprazole injected monthly. Called and spoke with husband about missed meds and that they have been sent in for pick up ASAP

## 2018-04-22 LAB — MICROALBUMIN / CREATININE URINE RATIO
Creatinine, Urine: <4.2 mg/dL
Microalbumin, Urine: <3 ug/mL

## 2018-04-22 LAB — CBC WITH DIFFERENTIAL/PLATELET
Basophils Absolute: 0.1 10*3/uL (ref 0.0–0.2)
Basos: 1 %
EOS (ABSOLUTE): 0.2 10*3/uL (ref 0.0–0.4)
Eos: 2 %
Hematocrit: 41.2 % (ref 34.0–46.6)
Hemoglobin: 13.8 g/dL (ref 11.1–15.9)
Immature Grans (Abs): 0 10*3/uL (ref 0.0–0.1)
Immature Granulocytes: 0 %
Lymphocytes Absolute: 2.4 10*3/uL (ref 0.7–3.1)
Lymphs: 27 %
MCH: 29.7 pg (ref 26.6–33.0)
MCHC: 33.5 g/dL (ref 31.5–35.7)
MCV: 89 fL (ref 79–97)
Monocytes Absolute: 0.7 10*3/uL (ref 0.1–0.9)
Monocytes: 7 %
Neutrophils Absolute: 5.7 10*3/uL (ref 1.4–7.0)
Neutrophils: 63 %
Platelets: 417 10*3/uL (ref 150–450)
RBC: 4.64 x10E6/uL (ref 3.77–5.28)
RDW: 14.3 % (ref 11.7–15.4)
WBC: 9 10*3/uL (ref 3.4–10.8)

## 2018-04-22 LAB — COMPREHENSIVE METABOLIC PANEL
ALT: 21 IU/L (ref 0–32)
AST: 22 IU/L (ref 0–40)
Albumin/Globulin Ratio: 1.6 (ref 1.2–2.2)
Albumin: 4.4 g/dL (ref 3.8–4.8)
Alkaline Phosphatase: 109 IU/L (ref 39–117)
BUN/Creatinine Ratio: 13 (ref 12–28)
BUN: 12 mg/dL (ref 8–27)
Bilirubin Total: 0.3 mg/dL (ref 0.0–1.2)
CO2: 28 mmol/L (ref 20–29)
Calcium: 9.7 mg/dL (ref 8.7–10.3)
Chloride: 98 mmol/L (ref 96–106)
Creatinine, Ser: 0.92 mg/dL (ref 0.57–1.00)
GFR calc Af Amer: 78 mL/min/{1.73_m2} (ref >59)
GFR calc non Af Amer: 67 mL/min/{1.73_m2} (ref >59)
Globulin, Total: 2.8 g/dL (ref 1.5–4.5)
Glucose: 87 mg/dL (ref 65–99)
Potassium: 4.4 mmol/L (ref 3.5–5.2)
Sodium: 141 mmol/L (ref 134–144)
Total Protein: 7.2 g/dL (ref 6.0–8.5)

## 2018-04-22 LAB — LIPID PANEL W/O CHOL/HDL RATIO
Cholesterol, Total: 237 mg/dL — ABNORMAL HIGH (ref 100–199)
HDL: 56 mg/dL (ref >39)
LDL Calculated: 156 mg/dL — ABNORMAL HIGH (ref 0–99)
Triglycerides: 127 mg/dL (ref 0–149)
VLDL Cholesterol Cal: 25 mg/dL (ref 5–40)

## 2018-04-22 LAB — HGB A1C W/O EAG: Hgb A1c MFr Bld: 6.3 % — ABNORMAL HIGH (ref 4.8–5.6)

## 2018-04-25 MED ORDER — PREGABALIN 50 MG PO CAPS: 50.0000 mg | ORAL_CAPSULE | Freq: Three times a day (TID) | ORAL | 3 refills | Status: DC

## 2018-04-25 NOTE — Addendum Note (Signed)
Addended by: Marcelino Duster on: 04/25/2018 08:57 AM   Modules accepted: Orders

## 2018-04-27 ENCOUNTER — Ambulatory Visit: Payer: Self-pay | Admitting: Internal Medicine

## 2018-05-25 ENCOUNTER — Other Ambulatory Visit: Payer: Self-pay | Admitting: Internal Medicine

## 2018-06-21 ENCOUNTER — Ambulatory Visit: Payer: No Typology Code available for payment source

## 2018-06-24 ENCOUNTER — Other Ambulatory Visit: Payer: Self-pay | Admitting: Internal Medicine

## 2018-06-24 DIAGNOSIS — K219 Gastro-esophageal reflux disease without esophagitis: Secondary | ICD-10-CM

## 2018-07-12 ENCOUNTER — Other Ambulatory Visit: Payer: Self-pay | Admitting: Internal Medicine

## 2018-07-13 ENCOUNTER — Other Ambulatory Visit: Payer: Self-pay

## 2018-07-13 DIAGNOSIS — E785 Hyperlipidemia, unspecified: Secondary | ICD-10-CM

## 2018-07-13 DIAGNOSIS — Z79899 Other long term (current) drug therapy: Secondary | ICD-10-CM

## 2018-07-14 LAB — LIPID PANEL W/O CHOL/HDL RATIO
Cholesterol, Total: 203 mg/dL — ABNORMAL HIGH (ref 100–199)
HDL: 44 mg/dL (ref >39)
LDL Calculated: 123 mg/dL — ABNORMAL HIGH (ref 0–99)
Triglycerides: 178 mg/dL — ABNORMAL HIGH (ref 0–149)
VLDL Cholesterol Cal: 36 mg/dL (ref 5–40)

## 2018-07-14 LAB — HEPATIC FUNCTION PANEL
ALT: 11 IU/L (ref 0–32)
AST: 16 IU/L (ref 0–40)
Albumin: 4.1 g/dL (ref 3.8–4.8)
Alkaline Phosphatase: 103 IU/L (ref 39–117)
Bilirubin Total: 0.2 mg/dL (ref 0.0–1.2)
Bilirubin, Direct: 0.08 mg/dL (ref 0.00–0.40)
Total Protein: 6.2 g/dL (ref 6.0–8.5)

## 2018-07-20 ENCOUNTER — Encounter: Payer: Self-pay | Admitting: Internal Medicine

## 2018-07-20 ENCOUNTER — Other Ambulatory Visit: Payer: Self-pay

## 2018-07-20 ENCOUNTER — Ambulatory Visit: Payer: Self-pay | Admitting: Internal Medicine

## 2018-07-20 VITALS — BP 140/90 | HR 96 | Temp 98.7°F | Ht 61.5 in | Wt 212.0 lb

## 2018-07-20 DIAGNOSIS — E1142 Type 2 diabetes mellitus with diabetic polyneuropathy: Secondary | ICD-10-CM

## 2018-07-20 DIAGNOSIS — I10 Essential (primary) hypertension: Secondary | ICD-10-CM

## 2018-07-20 DIAGNOSIS — E782 Mixed hyperlipidemia: Secondary | ICD-10-CM

## 2018-07-20 LAB — GLUCOSE, POCT (MANUAL RESULT ENTRY): POC Glucose: 152 mg/dl — AB (ref 70–99)

## 2018-07-20 MED ORDER — ROSUVASTATIN CALCIUM 20 MG PO TABS: ORAL_TABLET | ORAL | 11 refills | Status: DC

## 2018-07-20 NOTE — Progress Notes (Signed)
Subjective:    Patient ID: Kristine Atkinson, female   DOB: 1955/11/06, 63 y.o.   MRN: [id number]   HPI   Did not bring meds and cannot clearly say what meds she is taking.  "I am taking everything."  1.  Hyperlipidemia:  Discussed improved, but still needs to get most levels to a lower or in case of HDL, higher level for goal. Hepatic function was fine.  Lipid Panel     Component Value Date/Time   CHOL 203 (H) 07/13/2018 1010   TRIG 178 (H) 07/13/2018 1010   HDL 44 07/13/2018 1010   CHOLHDL 4.8 08/27/2014 0902   VLDL 26 08/27/2014 0902   LDLCALC 123 (H) 07/13/2018 1010   2.  Hypertension:  States she has not missed her antihypertensive.  She has been eating more and staying inside with the [place] pandemic.  3.  DM:  A1C back in April was good at 6.3% She is not checking sugars as she states she does not know how to use the glucometer.     Current Meds  Medication Sig  . aspirin 81 MG tablet Take 1 tablet (81 mg total) by mouth daily.  Marland Kitchen atorvastatin (LIPITOR) 80 MG tablet 1 tab by mouth daily with evening meal  . carvedilol (COREG) 3.125 MG tablet Take 1 tablet (3.125 mg total) by mouth 2 (two) times daily.  . cyclobenzaprine (FLEXERIL) 10 MG tablet TAKE 1 TABLET BY MOUTH THREE TIMES A DAY AS NEEDED FOR MUSCLE SPASM  . hydrochlorothiazide (HYDRODIURIL) 25 MG tablet 1 cap in the morning once daily.  Marland Kitchen losartan (COZAAR) 50 MG tablet Take 1 tablet (50 mg total) by mouth daily.  . meloxicam (MOBIC) 15 MG tablet TAKE 1 TABLET BY MOUTH DAILY WITH FOOD FOR LEG PAIN  . metFORMIN (GLUCOPHAGE-XR) 500 MG 24 hr tablet TAKE 1 TABLET BY MOUTH TWICE A DAY WITH MEALS  . omeprazole (PRILOSEC) 20 MG capsule TAKE 1 CAPSULE BY MOUTH TWICE A DAY  . pregabalin (LYRICA) 50 MG capsule Take 1 capsule (50 mg total) by mouth 3 (three) times daily.  . traZODone (DESYREL) 100 MG tablet Take 100 mg by mouth at bedtime.  . triamcinolone cream (KENALOG) 0.1 % Apply twice daily with Terbinafine to rash  until resolved.  . venlafaxine (EFFEXOR) 37.5 MG tablet Take 37.5 mg by mouth 2 (two) times daily. 1 every morning           Allergies  Allergen Reactions  . Garlic Itching  . Penicillins Other (See Comments)    Itching     Review of Systems    Objective:   BP 140/90 (BP Location: Right Arm, Patient Position: Sitting, Cuff Size: Normal)   Pulse 96   Temp 98.7 F (37.1 C)   Ht 5' 1.5" (1.562 m)   Wt 212 lb (96.2 kg)   BMI 39.41 kg/m   Physical Exam  NAD Lungs:  CTA CV: RRR without murmur or rub.  Radial pulses normal and equal. LE:  No edema   Assessment & Plan   1.  Hyperlipidemia: Entire panel still not at goal with high dose Atorvastatin.  Husband not here today to clarify whether she is taking meds appropriately. Encouraged her to be sure she brings meds in each visit. Switch to Rosuvastatin 20 mg daily with FLP, hepatic profile in 2 months.  2.  DM:  Was controlled in April, but does not sound like she is watching her diet or being physically active.  She  states she will work on lifestyle changes.   Will have her come in and work [place] on how to use glucometer. A1C with next visit.  3.  Hypertension:  Not as well controlled--to work on lifestyle changes.

## 2018-08-02 ENCOUNTER — Other Ambulatory Visit: Payer: Self-pay

## 2018-08-03 ENCOUNTER — Ambulatory Visit: Payer: No Typology Code available for payment source

## 2018-09-01 ENCOUNTER — Other Ambulatory Visit: Payer: Self-pay | Admitting: Internal Medicine

## 2018-09-19 ENCOUNTER — Other Ambulatory Visit: Payer: Self-pay

## 2018-09-20 ENCOUNTER — Other Ambulatory Visit: Payer: Self-pay

## 2018-09-23 ENCOUNTER — Ambulatory Visit: Payer: Self-pay | Admitting: Internal Medicine

## 2018-10-14 ENCOUNTER — Other Ambulatory Visit: Payer: Self-pay

## 2018-10-14 ENCOUNTER — Other Ambulatory Visit (INDEPENDENT_AMBULATORY_CARE_PROVIDER_SITE_OTHER): Payer: Self-pay

## 2018-10-14 DIAGNOSIS — E782 Mixed hyperlipidemia: Secondary | ICD-10-CM

## 2018-10-14 DIAGNOSIS — E1142 Type 2 diabetes mellitus with diabetic polyneuropathy: Secondary | ICD-10-CM

## 2018-10-14 DIAGNOSIS — Z79899 Other long term (current) drug therapy: Secondary | ICD-10-CM

## 2018-10-15 LAB — LIPID PANEL W/O CHOL/HDL RATIO
Cholesterol, Total: 115 mg/dL (ref 100–199)
HDL: 47 mg/dL (ref >39)
LDL Chol Calc (NIH): 50 mg/dL (ref 0–99)
Triglycerides: 94 mg/dL (ref 0–149)
VLDL Cholesterol Cal: 18 mg/dL (ref 5–40)

## 2018-10-15 LAB — HEPATIC FUNCTION PANEL
ALT: 10 IU/L (ref 0–32)
AST: 14 IU/L (ref 0–40)
Albumin: 4.3 g/dL (ref 3.8–4.8)
Alkaline Phosphatase: 103 IU/L (ref 39–117)
Bilirubin Total: 0.3 mg/dL (ref 0.0–1.2)
Bilirubin, Direct: 0.12 mg/dL (ref 0.00–0.40)
Total Protein: 6.7 g/dL (ref 6.0–8.5)

## 2018-10-15 LAB — HGB A1C W/O EAG: Hgb A1c MFr Bld: 6.4 % — ABNORMAL HIGH (ref 4.8–5.6)

## 2018-10-18 ENCOUNTER — Ambulatory Visit: Payer: Self-pay | Admitting: Internal Medicine

## 2018-11-24 ENCOUNTER — Other Ambulatory Visit: Payer: Self-pay | Admitting: Internal Medicine

## 2018-11-25 ENCOUNTER — Other Ambulatory Visit: Payer: Self-pay

## 2018-11-25 ENCOUNTER — Encounter: Payer: Self-pay | Admitting: Internal Medicine

## 2018-11-25 ENCOUNTER — Ambulatory Visit: Payer: Self-pay | Admitting: Internal Medicine

## 2018-11-25 VITALS — BP 138/88 | HR 72 | Resp 12 | Ht 61.5 in | Wt 215.0 lb

## 2018-11-25 DIAGNOSIS — I10 Essential (primary) hypertension: Secondary | ICD-10-CM

## 2018-11-25 DIAGNOSIS — E1142 Type 2 diabetes mellitus with diabetic polyneuropathy: Secondary | ICD-10-CM

## 2018-11-25 DIAGNOSIS — Z1231 Encounter for screening mammogram for malignant neoplasm of breast: Secondary | ICD-10-CM

## 2018-11-25 DIAGNOSIS — R609 Edema, unspecified: Secondary | ICD-10-CM

## 2018-11-25 DIAGNOSIS — Z23 Encounter for immunization: Secondary | ICD-10-CM

## 2018-11-25 DIAGNOSIS — E782 Mixed hyperlipidemia: Secondary | ICD-10-CM

## 2018-11-25 MED ORDER — AGAMATRIX PRESTO TEST VI STRP: ORAL_STRIP | 11 refills | Status: DC

## 2018-11-25 MED ORDER — AGAMATRIX ULTRA-THIN LANCETS MISC: 11 refills | Status: DC

## 2018-11-25 NOTE — Progress Notes (Signed)
Subjective:    Patient ID: Kristine Atkinson, female   DOB: [birth year], 63 y.o.   MRN: [id number]   HPI  1.  DM:  Discussed very good control with A1C at 6.4%.  She describes healthy diet. Needs new Rx for test strips and lancets.  2.  Hyperlipidemia:  Recent cholesterol panel much improved from July and all at goal. Continues on Rosuvastatin 20 mg daily.    3.  Hypertension:  Carvedilol, HCTZ, Losartan.  BP okay, but would like to see in 120/70 discussed.  4.  Peripheral edema:  Feels this is controlled.  Has not noted swelling.  5.  HM: Needs Mammogram, CPE, and influenza vaccine  Current Meds  Medication Sig  . ARIPiprazole Lauroxil ER (ARISTADA) 441 MG/1.6ML PRSY Inject into the muscle. Every 4 weeks or once a month  . aspirin 81 MG tablet Take 1 tablet (81 mg total) by mouth daily.  . benztropine (COGENTIN) 1 MG tablet Take 1 tablet (1 mg total) by mouth 2 (two) times daily.  . carvedilol (COREG) 3.125 MG tablet Take 1 tablet (3.125 mg total) by mouth 2 (two) times daily.  . cyclobenzaprine (FLEXERIL) 10 MG tablet TAKE 1 TABLET BY MOUTH THREE TIMES A DAY AS NEEDED FOR MUSCLE SPASM  . hydrochlorothiazide (HYDRODIURIL) 25 MG tablet 1 cap in the morning once daily.  Marland Kitchen losartan (COZAAR) 50 MG tablet Take 1 tablet (50 mg total) by mouth daily.  . meloxicam (MOBIC) 15 MG tablet TAKE 1 TABLET BY MOUTH DAILY WITH FOOD FOR LEG PAIN  . metFORMIN (GLUCOPHAGE-XR) 500 MG 24 hr tablet TAKE 1 TABLET BY MOUTH TWICE A DAY WITH MEALS  . omeprazole (PRILOSEC) 20 MG capsule TAKE 1 CAPSULE BY MOUTH TWICE A DAY  . pregabalin (LYRICA) 50 MG capsule Take 1 capsule (50 mg total) by mouth 3 (three) times daily.  . rosuvastatin (CRESTOR) 20 MG tablet 1 tab by mouth daily with evening meal  . traZODone (DESYREL) 100 MG tablet Take 100 mg by mouth at bedtime.  . triamcinolone cream (KENALOG) 0.1 % Apply twice daily with Terbinafine to rash until resolved.  . venlafaxine (EFFEXOR) 37.5 MG tablet Take  37.5 mg by mouth 2 (two) times daily. 1 every morning   Allergies  Allergen Reactions  . Garlic Itching  . Penicillins Other (See Comments)    Itching     Review of Systems    Objective:   BP 138/88 (BP Location: Left Arm, Patient Position: Sitting, Cuff Size: Large)   Pulse 72   Resp 12   Ht 5' 1.5" (1.562 m)   Wt 215 lb (97.5 kg)   BMI 39.97 kg/m   Physical Exam   NAD Great mood today HEENT: PERRL, EOMI, discs sharp.  TMs pearly gray. Neck:  Supple, No adenopathy, no thyromegaly Chest:  CTA CV:  RRR with normal S1 and S2, No S3, S4 or murmur.  No carotid bruits, Carotid, radial and DP pulses normal and equal Abd:  S, NT, No HSM or mass, + BS LE:  Mild pitting edema to mid pretib area bilaterally.   Assessment & Plan  1.  DM:  Excellent control. [facility]  2.  Hyperlipidemia:  Excellent control.  3.  Hypertension:  Fair control  To continue to work on dietary habits and physical activity.  4.  Peripheral edema: avoid salt, elevate legs when sitting.  Avoid standing without moving. Continue HCTZ  5.  HM:  Influenza vaccine, Mammogram, appt for CPE  in 3-4 months.

## 2018-12-10 ENCOUNTER — Encounter: Payer: Self-pay | Admitting: Internal Medicine

## 2018-12-21 ENCOUNTER — Telehealth: Payer: Self-pay | Admitting: Internal Medicine

## 2018-12-21 NOTE — Telephone Encounter (Signed)
Patient called requesting Rx on pregabalin (LYRICA) 50 MG capsule.  Please advise.

## 2018-12-22 NOTE — Telephone Encounter (Signed)
Spoke with husband. Time for patient to renew her pfizer application. Informed him once we receive it we will do our part and then they will need to complete their sections. States he will have them fax the form to our office.

## 2018-12-28 ENCOUNTER — Telehealth: Payer: Self-pay | Admitting: Internal Medicine

## 2018-12-28 NOTE — Telephone Encounter (Signed)
ERROR

## 2018-12-28 NOTE — Telephone Encounter (Signed)
Pt.'s husband Mr. Taglieri called stated he contacted [employer] this morning and was informed that pt. Is still enrolled with Pfizer until April 2021 and all they need from Dr. Amil Amen is for her to fax over a [place] to [phone].  Mr. [place]bothe informed we will discussed with Dr. Amil Amen and will call him back if any questions.

## 2018-12-29 ENCOUNTER — Telehealth: Payer: Self-pay | Admitting: Internal Medicine

## 2018-12-29 MED ORDER — PREGABALIN 50 MG PO CAPS: 50.0000 mg | ORAL_CAPSULE | Freq: Three times a day (TID) | ORAL | 3 refills | Status: DC

## 2018-12-29 NOTE — Telephone Encounter (Signed)
Written and placed on desk to fax.

## 2018-12-29 NOTE — Telephone Encounter (Signed)
Rx faxed to [facility] at [phone]. LVM informing patient.

## 2018-12-29 NOTE — Telephone Encounter (Signed)
Rx faxed to [facility] at [phone]. LVM informing patient.

## 2019-01-10 ENCOUNTER — Other Ambulatory Visit: Payer: Self-pay | Admitting: Internal Medicine

## 2019-02-06 ENCOUNTER — Ambulatory Visit: Payer: No Typology Code available for payment source | Attending: Internal Medicine

## 2019-02-06 DIAGNOSIS — Z20822 Contact with and (suspected) exposure to covid-19: Secondary | ICD-10-CM

## 2019-02-06 DIAGNOSIS — U071 COVID-19: Secondary | ICD-10-CM | POA: Insufficient documentation

## 2019-02-07 LAB — NOVEL CORONAVIRUS, NAA: SARS-CoV-2, NAA: DETECTED — AB

## 2019-02-08 ENCOUNTER — Other Ambulatory Visit: Payer: Self-pay | Admitting: Physician Assistant

## 2019-02-08 DIAGNOSIS — I10 Essential (primary) hypertension: Secondary | ICD-10-CM

## 2019-02-08 DIAGNOSIS — E1142 Type 2 diabetes mellitus with diabetic polyneuropathy: Secondary | ICD-10-CM

## 2019-02-08 DIAGNOSIS — U071 COVID-19: Secondary | ICD-10-CM

## 2019-02-08 NOTE — Progress Notes (Signed)
Hello Kristine Atkinson,   You have been scheduled to receive bamlanivumab (the monoclonal antibody we discussed) on Saturday 02/11/19 at 1030 AM.  Please arrive 15 minutes early.   The address for the infusion clinic site is:  [place], [place] (previously this was the [facility]).  When you drive in the main entrance you will see security -  tell them they are there to get an infusion and they will take you to where you need to go.   If you have questions please call [phone].   Should you develop worsening shortness of breath, chest pain or severe breathing problems please do not wait for this appointment and go to the Emergency room for evaluation and treatment.   The day of your visit you should: Marland Kitchen Get plenty of rest the night before and drink plenty of water . Eat a light meal/snack before coming and take your medications as prescribed  . Wear warm, comfortable clothes with a shirt that can roll-up over the elbow (will need IV start).  . Wear a mask  . Consider bringing some activity to help pass the time  I hope this helps find you feeling better,  Montey Hora, PA - C

## 2019-02-10 ENCOUNTER — Telehealth: Payer: Self-pay | Admitting: Internal Medicine

## 2019-02-10 NOTE — Telephone Encounter (Signed)
Left detailed message for contact tracer Beverlee Nims to reach out to patient. Informed Beverlee Nims of patients mental health and that he husband who generally is her caregiver is hospitalized right now with complications from Covid . Patient was given precautions and instructions on what she needs to do regarding covid precautions.

## 2019-02-10 NOTE — Telephone Encounter (Signed)
Patient called stating is + with COVID and states her husband is at hospital for the same reason.  To [facility] to f/u

## 2019-02-11 ENCOUNTER — Emergency Department (HOSPITAL_COMMUNITY): Payer: HRSA Program

## 2019-02-11 ENCOUNTER — Ambulatory Visit (HOSPITAL_COMMUNITY)
Admission: RE | Admit: 2019-02-11 | Discharge: 2019-02-11 | Disposition: A | Discharge status: Home / Self Care | Payer: HRSA Program | Source: Ambulatory Visit | Attending: Pulmonary Disease | Admitting: Pulmonary Disease

## 2019-02-11 ENCOUNTER — Encounter (HOSPITAL_COMMUNITY): Payer: Self-pay | Admitting: Emergency Medicine

## 2019-02-11 ENCOUNTER — Other Ambulatory Visit: Payer: Self-pay

## 2019-02-11 ENCOUNTER — Inpatient Hospital Stay (HOSPITAL_COMMUNITY)
Admission: EM | Admit: 2019-02-11 | Discharge: 2019-02-15 | DRG: 177 | Disposition: A | Discharge status: Home / Self Care | Payer: HRSA Program | Attending: Family Medicine | Admitting: Family Medicine

## 2019-02-11 DIAGNOSIS — Z23 Encounter for immunization: Secondary | ICD-10-CM | POA: Diagnosis not present

## 2019-02-11 DIAGNOSIS — R7881 Bacteremia: Secondary | ICD-10-CM | POA: Diagnosis present

## 2019-02-11 DIAGNOSIS — Z7952 Long term (current) use of systemic steroids: Secondary | ICD-10-CM | POA: Diagnosis not present

## 2019-02-11 DIAGNOSIS — E860 Dehydration: Secondary | ICD-10-CM | POA: Diagnosis present

## 2019-02-11 DIAGNOSIS — R0902 Hypoxemia: Secondary | ICD-10-CM

## 2019-02-11 DIAGNOSIS — Z79899 Other long term (current) drug therapy: Secondary | ICD-10-CM | POA: Diagnosis not present

## 2019-02-11 DIAGNOSIS — F329 Major depressive disorder, single episode, unspecified: Secondary | ICD-10-CM | POA: Diagnosis present

## 2019-02-11 DIAGNOSIS — I10 Essential (primary) hypertension: Secondary | ICD-10-CM

## 2019-02-11 DIAGNOSIS — U071 COVID-19: Principal | ICD-10-CM | POA: Diagnosis present

## 2019-02-11 DIAGNOSIS — E119 Type 2 diabetes mellitus without complications: Secondary | ICD-10-CM | POA: Diagnosis present

## 2019-02-11 DIAGNOSIS — F32A Depression, unspecified: Secondary | ICD-10-CM | POA: Diagnosis present

## 2019-02-11 DIAGNOSIS — E1142 Type 2 diabetes mellitus with diabetic polyneuropathy: Secondary | ICD-10-CM | POA: Insufficient documentation

## 2019-02-11 DIAGNOSIS — N179 Acute kidney failure, unspecified: Secondary | ICD-10-CM | POA: Diagnosis present

## 2019-02-11 DIAGNOSIS — E785 Hyperlipidemia, unspecified: Secondary | ICD-10-CM | POA: Diagnosis present

## 2019-02-11 DIAGNOSIS — F419 Anxiety disorder, unspecified: Secondary | ICD-10-CM | POA: Diagnosis present

## 2019-02-11 DIAGNOSIS — G894 Chronic pain syndrome: Secondary | ICD-10-CM | POA: Diagnosis present

## 2019-02-11 DIAGNOSIS — Z7982 Long term (current) use of aspirin: Secondary | ICD-10-CM

## 2019-02-11 DIAGNOSIS — Z7984 Long term (current) use of oral hypoglycemic drugs: Secondary | ICD-10-CM | POA: Diagnosis not present

## 2019-02-11 DIAGNOSIS — M797 Fibromyalgia: Secondary | ICD-10-CM | POA: Diagnosis present

## 2019-02-11 DIAGNOSIS — J9601 Acute respiratory failure with hypoxia: Secondary | ICD-10-CM | POA: Diagnosis present

## 2019-02-11 DIAGNOSIS — Z87891 Personal history of nicotine dependence: Secondary | ICD-10-CM

## 2019-02-11 DIAGNOSIS — Z791 Long term (current) use of non-steroidal anti-inflammatories (NSAID): Secondary | ICD-10-CM

## 2019-02-11 DIAGNOSIS — K219 Gastro-esophageal reflux disease without esophagitis: Secondary | ICD-10-CM | POA: Diagnosis present

## 2019-02-11 DIAGNOSIS — F69 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: Secondary | ICD-10-CM | POA: Diagnosis present

## 2019-02-11 DIAGNOSIS — J1282 Pneumonia due to coronavirus disease 2019: Secondary | ICD-10-CM | POA: Diagnosis present

## 2019-02-11 DIAGNOSIS — Z6841 Body Mass Index (BMI) 40.0 and over, adult: Secondary | ICD-10-CM | POA: Diagnosis not present

## 2019-02-11 HISTORY — DX: COVID-19: U07.1

## 2019-02-11 HISTORY — DX: Pneumonia due to coronavirus disease 2019: J12.82

## 2019-02-11 LAB — CBC WITH DIFFERENTIAL/PLATELET
Abs Immature Granulocytes: 0.04 10*3/uL (ref 0.00–0.07)
Basophils Absolute: 0 10*3/uL (ref 0.0–0.1)
Basophils Relative: 0 %
Eosinophils Absolute: 0 10*3/uL (ref 0.0–0.5)
Eosinophils Relative: 0 %
HCT: 43.2 % (ref 36.0–46.0)
Hemoglobin: 13.8 g/dL (ref 12.0–15.0)
Immature Granulocytes: 1 %
Lymphocytes Relative: 17 %
Lymphs Abs: 1.4 10*3/uL (ref 0.7–4.0)
MCH: 29 pg (ref 26.0–34.0)
MCHC: 31.9 g/dL (ref 30.0–36.0)
MCV: 90.8 fL (ref 80.0–100.0)
Monocytes Absolute: 0.4 10*3/uL (ref 0.1–1.0)
Monocytes Relative: 5 %
Neutro Abs: 6.2 10*3/uL (ref 1.7–7.7)
Neutrophils Relative %: 77 %
Platelets: 259 10*3/uL (ref 150–400)
RBC: 4.76 MIL/uL (ref 3.87–5.11)
RDW: 16.9 % — ABNORMAL HIGH (ref 11.5–15.5)
WBC: 8 10*3/uL (ref 4.0–10.5)
nRBC: 0 % (ref 0.0–0.2)

## 2019-02-11 LAB — LACTATE DEHYDROGENASE: LDH: 324 U/L — ABNORMAL HIGH (ref 98–192)

## 2019-02-11 LAB — COMPREHENSIVE METABOLIC PANEL
ALT: 14 U/L (ref 0–44)
AST: 25 U/L (ref 15–41)
Albumin: 3.1 g/dL — ABNORMAL LOW (ref 3.5–5.0)
Alkaline Phosphatase: 43 U/L (ref 38–126)
Anion gap: 9 (ref 5–15)
BUN: 34 mg/dL — ABNORMAL HIGH (ref 8–23)
CO2: 26 mmol/L (ref 22–32)
Calcium: 7.5 mg/dL — ABNORMAL LOW (ref 8.9–10.3)
Chloride: 101 mmol/L (ref 98–111)
Creatinine, Ser: 1.5 mg/dL — ABNORMAL HIGH (ref 0.44–1.00)
GFR calc Af Amer: 43 mL/min — ABNORMAL LOW (ref >60)
GFR calc non Af Amer: 37 mL/min — ABNORMAL LOW (ref >60)
Glucose, Bld: 94 mg/dL (ref 70–99)
Potassium: 4.4 mmol/L (ref 3.5–5.1)
Sodium: 136 mmol/L (ref 135–145)
Total Bilirubin: 0.7 mg/dL (ref 0.3–1.2)
Total Protein: 6.8 g/dL (ref 6.5–8.1)

## 2019-02-11 LAB — PROCALCITONIN: Procalcitonin: 0.31 ng/mL

## 2019-02-11 LAB — CBG MONITORING, ED: Glucose-Capillary: 130 mg/dL — ABNORMAL HIGH (ref 70–99)

## 2019-02-11 LAB — GLUCOSE, CAPILLARY: Glucose-Capillary: 174 mg/dL — ABNORMAL HIGH (ref 70–99)

## 2019-02-11 LAB — C-REACTIVE PROTEIN: CRP: 21 mg/dL — ABNORMAL HIGH (ref <1.0)

## 2019-02-11 LAB — FERRITIN: Ferritin: 166 ng/mL (ref 11–307)

## 2019-02-11 LAB — HIV ANTIBODY (ROUTINE TESTING W REFLEX): HIV Screen 4th Generation wRfx: NONREACTIVE

## 2019-02-11 LAB — TRIGLYCERIDES: Triglycerides: 75 mg/dL

## 2019-02-11 LAB — FIBRINOGEN: Fibrinogen: 641 mg/dL — ABNORMAL HIGH (ref 210–475)

## 2019-02-11 LAB — ABO/RH: ABO/RH(D): AB POS

## 2019-02-11 LAB — D-DIMER, QUANTITATIVE: D-Dimer, Quant: 0.7 ug/mL-FEU — ABNORMAL HIGH (ref 0.00–0.50)

## 2019-02-11 LAB — LACTIC ACID, PLASMA: Lactic Acid, Venous: 1.2 mmol/L (ref 0.5–1.9)

## 2019-02-11 MED ORDER — ASPIRIN 81 MG PO CHEW
81.0000 mg | CHEWABLE_TABLET | Freq: Every day | ORAL | Status: DC
  Administered 2019-02-12 – 2019-02-15 (×4): 81 mg via ORAL
  Filled 2019-02-11 (×6): qty 1

## 2019-02-11 MED ORDER — CARVEDILOL 3.125 MG PO TABS
3.1250 mg | ORAL_TABLET | Freq: Two times a day (BID) | ORAL | Status: DC
  Administered 2019-02-11 – 2019-02-15 (×8): 3.125 mg via ORAL
  Filled 2019-02-11 (×9): qty 1

## 2019-02-11 MED ORDER — TRAZODONE HCL 50 MG PO TABS
100.0000 mg | ORAL_TABLET | Freq: Every day | ORAL | Status: DC
  Administered 2019-02-11 – 2019-02-14 (×4): 100 mg via ORAL
  Filled 2019-02-11 (×4): qty 2

## 2019-02-11 MED ORDER — DEXAMETHASONE SODIUM PHOSPHATE 10 MG/ML IJ SOLN
10.0000 mg | Freq: Once | INTRAMUSCULAR | Status: AC
  Administered 2019-02-11: 12:00:00 10 mg via INTRAVENOUS
  Filled 2019-02-11: qty 1

## 2019-02-11 MED ORDER — ROSUVASTATIN CALCIUM 5 MG PO TABS
20.0000 mg | ORAL_TABLET | Freq: Every day | ORAL | Status: DC
  Administered 2019-02-11 – 2019-02-14 (×4): 20 mg via ORAL
  Filled 2019-02-11: qty 1
  Filled 2019-02-11 (×3): qty 4
  Filled 2019-02-11: qty 1

## 2019-02-11 MED ORDER — PNEUMOCOCCAL VAC POLYVALENT 25 MCG/0.5ML IJ INJ
0.5000 mL | INJECTION | INTRAMUSCULAR | Status: AC
  Administered 2019-02-12: 0.5 mL via INTRAMUSCULAR
  Filled 2019-02-11 (×2): qty 0.5

## 2019-02-11 MED ORDER — ASCORBIC ACID 500 MG PO TABS
500.0000 mg | ORAL_TABLET | Freq: Every day | ORAL | Status: DC
  Administered 2019-02-11 – 2019-02-15 (×5): 500 mg via ORAL
  Filled 2019-02-11 (×5): qty 1

## 2019-02-11 MED ORDER — BENZTROPINE MESYLATE 1 MG PO TABS
1.0000 mg | ORAL_TABLET | Freq: Two times a day (BID) | ORAL | Status: DC
  Administered 2019-02-11 – 2019-02-15 (×8): 1 mg via ORAL
  Filled 2019-02-11 (×9): qty 1

## 2019-02-11 MED ORDER — SODIUM CHLORIDE 0.9 % IV SOLN
200.0000 mg | Freq: Once | INTRAVENOUS | Status: AC
  Administered 2019-02-11: 15:00:00 200 mg via INTRAVENOUS
  Filled 2019-02-11: qty 40

## 2019-02-11 MED ORDER — SODIUM CHLORIDE 0.9 % IV SOLN
100.0000 mg | Freq: Every day | INTRAVENOUS | Status: AC
  Administered 2019-02-12 – 2019-02-15 (×4): 100 mg via INTRAVENOUS
  Filled 2019-02-11 (×4): qty 20

## 2019-02-11 MED ORDER — EPINEPHRINE 0.3 MG/0.3ML IJ SOAJ: 0.3000 mg | Freq: Once | INTRAMUSCULAR | Status: DC | PRN

## 2019-02-11 MED ORDER — DEXAMETHASONE SODIUM PHOSPHATE 10 MG/ML IJ SOLN: 6.0000 mg | INTRAMUSCULAR | Status: DC

## 2019-02-11 MED ORDER — ALBUTEROL SULFATE HFA 108 (90 BASE) MCG/ACT IN AERS: 2.0000 | INHALATION_SPRAY | Freq: Once | RESPIRATORY_TRACT | Status: DC | PRN

## 2019-02-11 MED ORDER — SODIUM CHLORIDE 0.9 % IV BOLUS
500.0000 mL | Freq: Once | INTRAVENOUS | Status: AC
  Administered 2019-02-11: 12:00:00 500 mL via INTRAVENOUS

## 2019-02-11 MED ORDER — FAMOTIDINE IN NACL 20-0.9 MG/50ML-% IV SOLN: 20.0000 mg | Freq: Once | INTRAVENOUS | Status: DC | PRN

## 2019-02-11 MED ORDER — ZINC SULFATE 220 (50 ZN) MG PO CAPS
220.0000 mg | ORAL_CAPSULE | Freq: Every day | ORAL | Status: DC
  Administered 2019-02-11 – 2019-02-15 (×5): 220 mg via ORAL
  Filled 2019-02-11 (×5): qty 1

## 2019-02-11 MED ORDER — GUAIFENESIN-DM 100-10 MG/5ML PO SYRP: 10.0000 mL | ORAL_SOLUTION | ORAL | Status: DC | PRN

## 2019-02-11 MED ORDER — SODIUM CHLORIDE 0.9 % IV SOLN: INTRAVENOUS | Status: DC | PRN

## 2019-02-11 MED ORDER — PANTOPRAZOLE SODIUM 40 MG PO TBEC
40.0000 mg | DELAYED_RELEASE_TABLET | Freq: Every day | ORAL | Status: DC
  Administered 2019-02-12 – 2019-02-15 (×4): 40 mg via ORAL
  Filled 2019-02-11 (×5): qty 1

## 2019-02-11 MED ORDER — ACETAMINOPHEN 325 MG PO TABS
650.0000 mg | ORAL_TABLET | Freq: Once | ORAL | Status: AC
  Administered 2019-02-11: 650 mg via ORAL
  Filled 2019-02-11: qty 2

## 2019-02-11 MED ORDER — ENOXAPARIN SODIUM 40 MG/0.4ML ~~LOC~~ SOLN
40.0000 mg | SUBCUTANEOUS | Status: DC
  Administered 2019-02-11: 21:00:00 40 mg via SUBCUTANEOUS
  Filled 2019-02-11: qty 0.4

## 2019-02-11 MED ORDER — INSULIN ASPART 100 UNIT/ML ~~LOC~~ SOLN
0.0000 [IU] | Freq: Every day | SUBCUTANEOUS | Status: DC
  Filled 2019-02-11: qty 0.05

## 2019-02-11 MED ORDER — SODIUM CHLORIDE 0.9 % IV SOLN
700.0000 mg | Freq: Once | INTRAVENOUS | Status: DC
  Filled 2019-02-11: qty 20

## 2019-02-11 MED ORDER — INSULIN ASPART 100 UNIT/ML ~~LOC~~ SOLN
0.0000 [IU] | Freq: Three times a day (TID) | SUBCUTANEOUS | Status: DC
  Administered 2019-02-11 – 2019-02-12 (×2): 1 [IU] via SUBCUTANEOUS
  Administered 2019-02-12: 16:00:00 3 [IU] via SUBCUTANEOUS
  Administered 2019-02-13 – 2019-02-15 (×4): 2 [IU] via SUBCUTANEOUS
  Administered 2019-02-15: 08:00:00 1 [IU] via SUBCUTANEOUS
  Filled 2019-02-11: qty 0.09

## 2019-02-11 MED ORDER — TOCILIZUMAB 400 MG/20ML IV SOLN
800.0000 mg | Freq: Once | INTRAVENOUS | Status: AC
  Administered 2019-02-11: 16:00:00 800 mg via INTRAVENOUS
  Filled 2019-02-11: qty 40

## 2019-02-11 MED ORDER — SODIUM CHLORIDE 0.9 % IV SOLN: INTRAVENOUS | Status: DC

## 2019-02-11 MED ORDER — VENLAFAXINE HCL 37.5 MG PO TABS
37.5000 mg | ORAL_TABLET | Freq: Two times a day (BID) | ORAL | Status: DC
  Administered 2019-02-11 – 2019-02-15 (×8): 37.5 mg via ORAL
  Filled 2019-02-11 (×10): qty 1

## 2019-02-11 MED ORDER — DIPHENHYDRAMINE HCL 50 MG/ML IJ SOLN: 50.0000 mg | Freq: Once | INTRAMUSCULAR | Status: DC | PRN

## 2019-02-11 MED ORDER — METHYLPREDNISOLONE SODIUM SUCC 125 MG IJ SOLR: 125.0000 mg | Freq: Once | INTRAMUSCULAR | Status: DC | PRN

## 2019-02-11 NOTE — ED Notes (Addendum)
[facility] called and notified of transport. Report given to Ramona, RN at [facility]. Papers at nurses station.

## 2019-02-11 NOTE — ED Triage Notes (Signed)
Arrives via EMS from [facility] infusion clinic, room oxygen saturation found to be mid 80s RA. Placed on 4L [place], saturation up to 95%. Patient is HOH.

## 2019-02-11 NOTE — ED Notes (Signed)
Pt belongings in pt belonging bag. Pt packed up and ready for [facility] transport.

## 2019-02-11 NOTE — Progress Notes (Signed)
EMS here to transport pt. Report given.

## 2019-02-11 NOTE — ED Provider Notes (Signed)
Medical screening examination/treatment/procedure(s) were conducted as a shared visit with non-physician practitioner(s) and myself.  I personally evaluated the patient during the encounter. Briefly, the patient is a 64 y.o. female with history of diabetes, hypertension who presents to the ED with shortness of breath, fever, tachycardia.  Patient febrile tachycardic and hypoxic upon arrival.  Positive for coronavirus 5 days ago.  Appears to have sepsis from coronavirus with new oxygen requirement.  Improved on 4 L of oxygen.  Chest x-ray consistent with coronavirus changes.  Inflammatory markers elevated.  Patient feeling better after Tylenol.  Given coronavirus and sepsis and new hypoxia will admit for further care.  Decadron has been ordered.   EKG Interpretation  Date/Time:  Saturday February 11 2019 11:58:27 EST Ventricular Rate:  109 PR Interval:    QRS Duration: 88 QT Interval:  340 QTC Calculation: 458 R Axis:   86 Text Interpretation: Sinus tachycardia Ventricular bigeminy Consider right atrial enlargement Borderline right axis deviation Confirmed by Lennice Sites [id number]) on 02/11/2019 12:04:07 PM           Lennice Sites, DO 02/11/19 1317

## 2019-02-11 NOTE — ED Notes (Signed)
Patient was provided with dinner tray. Patient stated that she was not hungry.

## 2019-02-11 NOTE — Progress Notes (Signed)
Pt's oxygen saturation remaining 81-84% on room air. 911 called for transport to ED. Nasal cannula placed at 4l/min and sat came up to 95%.

## 2019-02-11 NOTE — ED Provider Notes (Signed)
[facility] DEPT Provider Note   CSN: [id number] Arrival date & time: 02/11/19  1138     History Chief Complaint  Patient presents with  . Shortness of Breath  . COVID positive    Kristine Atkinson is a 64 y.o. female past medical history significant for chronic pain, cocaine abuse, diabetes, fibromyalgia, hypertension, hyperlipidemia presented to emergency room today via EMS with chief complaint of Covid positive shortness of breath.  Patient was at the infusion clinic but when checking in was found to be hypoxic to the low 80s on room air.  She was put on 4 L with improvement to 94%.  She did not have her infusion was sent to the emergency department instead.  Patient is unable to provide much history.  She keeps saying she is thirsty.  Level 5 caveat applies due to severity of condition.  Chart review shows patient tested positive for Covid on 02/06/2019.  This is day 5 of her illness.  Past Medical History:  Diagnosis Date  . Cellulitis 06/06/2013  . Chest pain 06/07/2015  . Chronic pain   . Cocaine abuse ([place]) 09/09/2012   Cocaine positive on UDS 09/09/2012.    . Diabetes mellitus   . Elevated serum creatinine 08/14/2013  . Fibromyalgia   . Greater trochanteric bursitis of left hip 05/29/2014  . Hyperlipidemia   . Hypertension   . Syncope 08/14/2013    Patient Active Problem List   Diagnosis Date Noted  . Bilateral thigh pain 12/27/2015  . Rash 09/05/2015  . 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 Right shoulder pain 03/22/2015  . Right wrist pain 03/22/2015  . Left shoulder pain 02/14/2015  . Anxiety and depression 02/14/2015  . Peripheral autonomic neuropathy due to diabetes mellitus ([place]) 11/23/2014  . Greater trochanteric bursitis of left hip 05/29/2014  . Restrictive lung disease 10/10/2013  . Diastolic dysfunction 99991111  . Obesity (BMI 30-39.9) 05/29/2013  . Thickened endometrium 09/19/2012  . Cocaine  abuse ([place]) 09/09/2012  . Hyperlipidemia with target LDL less than 100 07/15/2011  . Diabetes type 2, controlled ([place]) 06/01/2011  . Essential hypertension, benign 06/01/2011  . GERD (gastroesophageal reflux disease) 06/01/2011  . Former smoker 06/01/2011  . Fibromyalgia 05/13/2011    Past Surgical History:  Procedure Laterality Date  . OOPHORECTOMY     ectopic     OB History    Gravida  6   Para  3   Term  2   Preterm  1   AB  3   Living  3     SAB  1   TAB  2   Ectopic  0   Multiple  0   Live Births              Family History  Problem Relation Age of Onset  . Hypertension Mother   . Cancer Maternal Aunt        colon cancer    Social History   Tobacco Use  . Smoking status: Former Smoker    Packs/day: 0.20    Years: 41.00    Pack years: 8.20    Types: Cigarettes    Start date: 01/12/1973    Quit date: 06/13/2015    Years since quitting: 3.6  . Smokeless tobacco: Never Used  . Tobacco comment: smokes about 3-4 cigs per day  as of  02/2014  Substance Use Topics  . Alcohol use: No    Alcohol/week: 0.0 standard drinks  .  Drug use: Not on file    Home Medications Prior to Admission medications   Medication Sig Start Date End Date Taking? Authorizing Provider  AgaMatrix Ultra-Thin Lancets MISC Check blood glucose twice daily before meals 11/25/18   Mack Hook, MD  ARIPiprazole Lauroxil ER (ARISTADA) 441 MG/1.6ML PRSY Inject into the muscle. Every 4 weeks or once a month    [provider]  aspirin 81 MG tablet Take 1 tablet (81 mg total) by mouth daily. 06/07/15   Lupita Dawn, MD  benztropine (COGENTIN) 1 MG tablet Take 1 tablet (1 mg total) by mouth 2 (two) times daily. 09/29/16   Mack Hook, MD  carvedilol (COREG) 3.125 MG tablet Take 1 tablet (3.125 mg total) by mouth 2 (two) times daily. 04/21/18   Mack Hook, MD  cyclobenzaprine (FLEXERIL) 10 MG tablet TAKE 1 TABLET BY MOUTH THREE TIMES A DAY AS NEEDED FOR  MUSCLE SPASM 01/10/19   Mack Hook, MD  glucose blood (AGAMATRIX PRESTO TEST) test strip Check blood glucose twice daily before meals 11/25/18   Mack Hook, MD  hydrochlorothiazide (HYDRODIURIL) 25 MG tablet 1 cap in the morning once daily. 04/21/18   Mack Hook, MD  losartan (COZAAR) 50 MG tablet Take 1 tablet (50 mg total) by mouth daily. 04/21/18   Mack Hook, MD  meloxicam (MOBIC) 15 MG tablet TAKE 1 TABLET BY MOUTH DAILY WITH FOOD FOR LEG PAIN 01/10/19   Mack Hook, MD  metFORMIN (GLUCOPHAGE-XR) 500 MG 24 hr tablet TAKE 1 TABLET BY MOUTH TWICE A DAY WITH MEALS 07/12/18   Mack Hook, MD  omeprazole (PRILOSEC) 20 MG capsule TAKE 1 CAPSULE BY MOUTH TWICE A DAY 06/24/18   Mack Hook, MD  pregabalin (LYRICA) 50 MG capsule Take 1 capsule (50 mg total) by mouth 3 (three) times daily. 12/29/18   Mack Hook, MD  rosuvastatin (CRESTOR) 20 MG tablet 1 tab by mouth daily with evening meal 07/20/18   Mack Hook, MD  traZODone (DESYREL) 100 MG tablet Take 100 mg by mouth at bedtime.    [provider]  triamcinolone cream (KENALOG) 0.1 % Apply twice daily with Terbinafine to rash until resolved. 04/21/18   Mack Hook, MD  venlafaxine (EFFEXOR) 37.5 MG tablet Take 37.5 mg by mouth 2 (two) times daily. 1 every morning    [provider]    Allergies    Garlic and Penicillins  Review of Systems   Review of Systems  Unable to perform ROS: Acuity of condition      Physical Exam Updated Vital Signs BP (!) 128/91 (BP Location: Left Arm)   Pulse (!) 109   Temp (!) 102.8 F (39.3 C) (Oral)   Resp (!) 22   SpO2 95%   Physical Exam Vitals and nursing note reviewed.  Constitutional:      General: She is not in acute distress.    Appearance: She is not ill-appearing.     Comments: Patient looks to not feel well.  She is very dehydrated, dry mucous membranes and tongue.  HENT:     Head: Normocephalic and  atraumatic.     Right Ear: Tympanic membrane and external ear normal.     Left Ear: Tympanic membrane and external ear normal.     Nose: Nose normal.     Mouth/Throat:     Mouth: Mucous membranes are moist.     Pharynx: Oropharynx is clear.  Eyes:     General: No scleral icterus.       Right  eye: No discharge.        Left eye: No discharge.     Extraocular Movements: Extraocular movements intact.     Conjunctiva/sclera: Conjunctivae normal.     Pupils: Pupils are equal, round, and reactive to light.  Neck:     Vascular: No JVD.  Cardiovascular:     Rate and Rhythm: Regular rhythm. Tachycardia present.     Pulses: Normal pulses.          Radial pulses are 2+ on the right side and 2+ on the left side.     Heart sounds: Normal heart sounds.     Comments: Heart rate ranging from 104-109 on exam. Pulmonary:     Comments: She is tachypneic.  She speaks in short sentences.  Lung sounds diminished throughout. Symmetric chest rise. No wheezing, rales, or rhonchi.  SPO2 is 94% on 4 L. Abdominal:     Comments: Abdomen is soft, non-distended, and non-tender in all quadrants. No rigidity, no guarding. No peritoneal signs.  Musculoskeletal:        General: Normal range of motion.     Cervical back: Normal range of motion.  Skin:    General: Skin is warm and dry.     Capillary Refill: Capillary refill takes less than 2 seconds.  Neurological:     Mental Status: She is oriented to person, place, and time.     GCS: GCS eye subscore is 4. GCS verbal subscore is 5. GCS motor subscore is 6.     Comments: Fluent speech, no facial droop.  Psychiatric:        Behavior: Behavior normal.       ED Results / Procedures / Treatments   Labs (all labs ordered are listed, but only abnormal results are displayed) Labs Reviewed  CBC WITH DIFFERENTIAL/PLATELET - Abnormal; Notable for the following components:      Result Value   RDW 16.9 (*)    All other components within normal limits  COMPREHENSIVE  METABOLIC PANEL - Abnormal; Notable for the following components:   BUN 34 (*)    Creatinine, Ser 1.50 (*)    Calcium 7.5 (*)    Albumin 3.1 (*)    GFR calc non Af Amer 37 (*)    GFR calc Af Amer 43 (*)    All other components within normal limits  D-DIMER, QUANTITATIVE (NOT AT [facility]) - Abnormal; Notable for the following components:   D-Dimer, Quant 0.70 (*)    All other components within normal limits  LACTATE DEHYDROGENASE - Abnormal; Notable for the following components:   LDH 324 (*)    All other components within normal limits  FIBRINOGEN - Abnormal; Notable for the following components:   Fibrinogen 641 (*)    All other components within normal limits  C-REACTIVE PROTEIN - Abnormal; Notable for the following components:   CRP 21.0 (*)    All other components within normal limits  CULTURE, BLOOD (ROUTINE X 2)  CULTURE, BLOOD (ROUTINE X 2)  LACTIC ACID, PLASMA  PROCALCITONIN  FERRITIN  TRIGLYCERIDES  LACTIC ACID, PLASMA    EKG EKG Interpretation  Date/Time:  Saturday February 11 2019 11:58:27 EST Ventricular Rate:  109 PR Interval:    QRS Duration: 88 QT Interval:  340 QTC Calculation: 458 R Axis:   86 Text Interpretation: Sinus tachycardia Ventricular bigeminy Consider right atrial enlargement Borderline right axis deviation Confirmed by Lennice Sites [phone]) on 02/11/2019 12:04:07 PM   Radiology DG Chest [facility] 1 View  Result  Date: 02/11/2019 CLINICAL DATA:  COVID-19 positive.  Shortness of breath. EXAM: PORTABLE CHEST 1 VIEW COMPARISON:  June 15, 2012 FINDINGS: The cardiomediastinal silhouette is stable. No pneumothorax. The left lung is clear. There is infiltrate in the right base. No other interval changes or acute abnormalities. IMPRESSION: Developing infiltrate in the right base consistent with the patient's history of COVID-19. No other abnormalities. Electronically Signed   By: Dorise Bullion III M.D   On: 02/11/2019 12:53    Procedures .Critical  Care Performed by: Cherre Robins, PA-C Authorized by: Cherre Robins, PA-C   Critical care provider statement:    Critical care time (minutes):  37   Critical care time was exclusive of:  Separately billable procedures and treating other patients and teaching time   Critical care was necessary to treat or prevent imminent or life-threatening deterioration of the following conditions: Acute respiratory failure secondary to Covid 19.   Critical care was time spent personally by me on the following activities:  Blood draw for specimens, development of treatment plan with patient or surrogate, evaluation of patient's response to treatment, examination of patient, obtaining history from patient or surrogate, ordering and performing treatments and interventions, ordering and review of laboratory studies, ordering and review of radiographic studies, pulse oximetry, re-evaluation of patient's condition and review of old charts   I assumed direction of critical care for this patient from another provider in my specialty: no     (including critical care time)  Medications Ordered in ED Medications  acetaminophen (TYLENOL) tablet 650 mg (650 mg Oral Given 02/11/19 1225)  dexamethasone (DECADRON) injection 10 mg (10 mg Intravenous Given 02/11/19 1226)  sodium chloride 0.9 % bolus 500 mL (0 mLs Intravenous Stopped 02/11/19 1333)    ED Course  I have reviewed the triage vital signs and the nursing notes.  Pertinent labs & imaging results that were available during my care of the patient were reviewed by me and considered in my medical decision making (see chart for details).  Clinical Course as of Feb 11 1348  Sat Feb 11, 2019  1157 Attempted to contact patient's husband Chelcee Gulbrandson at the phone number listed in the chart.  Was not able to reach him, will try again   [KA]  1315 Elevated at 641  Fibrinogen(!) [KA]  1316 Elevated at 324  Lactate dehydrogenase(!) [KA]  1331 Patient's  husband is currently hospitalized to [facility] for Covid   [KA]    Clinical Course User Index [KA] Cherre Robins, [place]   Vitals:   02/11/19 1230 02/11/19 1300 02/11/19 1329 02/11/19 1330  BP: 108/70 106/61  94/62  Pulse: (!) 111 (!) 102  (!) 104  Resp: 16 (!) 27  (!) 25  Temp:   100 F (37.8 C)   TempSrc:   Oral   SpO2: 99% 99%  99%    MDM Rules/Calculators/A&P                      Patient seen and examined. Patient presents awake, alert, hemodynamically stable.  She is febrile to 102.8 and tachycardic to 109.  She is hypoxic on room air to the low 80s, this improved to 95% on 4 L.  Patient is unable to provide much history.  She is alert and oriented, however is hard of hearing and cannot answer questions appropriately when speaking loudly in her ear.  He is following commands.  She is repetitively saying she is thirsty and tired.  She looks to be very dehydrated, dry mucous membranes and oral mucosa.  She is tachypneic and speaking in short sentences.  Lung sounds are diminished throughout.  No wheezing rales or rhonchi heard.  No tenderness or abdominal tenderness.  No signs of volume overload on exam.  Given Tylenol for fever, decadron, and Covid admission labs ordered.  500 ml fluid bolus ordered as she is so dry. EKG shows sinus tachycardia at 109, no STEMI. CBC without leukocytosis, no anemia. CMP shows elevated creatinine of 1.5, previous to compare is x9 months ago when it was normal.  No severe electrolyte derangement, normal liver enzymes.  Elevated LDH, fibrinogen, D-dimer typical with Covid diagnosis.  Dimer is only slightly elevated at 0.70.  Normal lactic acid.  Procalcitonin within normal range.  CRP, ferritin still pending. Chest x-ray viewed by me shows developing infiltrate in right lung base, suggesting Covid pneumonia. Rechecked temperature is now 100. This case was discussed with Dr. Ronnald Nian who has seen the patient and agrees with plan to admit. Spoke with Dr.  Tawanna Solo with hospitalist service who agrees to assume care of patient and bring into the hospital for further evaluation and management.    Portions of this note were generated with [employer]. Dictation errors may occur despite best attempts at proofreading.   Final Clinical Impression(s) / ED Diagnoses Final diagnoses:  COVID-19 virus infection  Hypoxia  AKI (acute kidney injury) [facility])    Rx / DC Orders ED Discharge Orders    None       Cherre Robins, PA-C 02/11/19 1417    Lennice Sites, DO 02/11/19 1923

## 2019-02-11 NOTE — H&P (Signed)
History and Physical    Kristine Atkinson [id number] DOB: 08/15/1955 DOA: 02/11/2019  PCP: Mack Hook, MD   Patient coming from: Home    Chief Complaint: Shortness of breath  HPI: Kristine Atkinson is a 65 y.o. female with medical history significant of hypertension, diabetes, chronic pain syndrome, cocaine abuse, fibromyalgia hyperlipidemia, extensive psych history who was sent from [facility] infusion clinic after she was found to be  hypoxic in the range of 80s on room air.  She was diagnosed with Covid in 02/06/2019 and was supposed to get bamlanivumab infusion today.  Patient is a very poor historian.  Her husband is currently at [facility] being treated for Covid pneumonia. As per some of the information she provides, she usually lives with her husband at home.  She started feeling more shortness of breath, cough, had diarrhea since last few days.  She says she was also feeling thirsty and tired.  She was supposed to get infusion treatment today. Patient seen and examined at the bedside in the emergency department.  She says she feels better after she was put on oxygen. She denied any fever, abdominal pain, nausea, vomiting, dysuria.  ED Course: Found to be hypoxic on presentation.  Currently on 4 L of oxygen per minute.  Chest x-ray done in the emergency department showed developing infiltrate on the right base.  Elevated inflammatory markers.  Also has mild acute kidney injury.  She had temperature of 100 F.  Review of Systems: As per HPI otherwise 10 point review of systems negative.    Past Medical History:  Diagnosis Date  . Cellulitis 06/06/2013  . Chest pain 06/07/2015  . Chronic pain   . Cocaine abuse ([place]) 09/09/2012   Cocaine positive on UDS 09/09/2012.    . Diabetes mellitus   . Elevated serum creatinine 08/14/2013  . Fibromyalgia   . Greater trochanteric bursitis of left hip 05/29/2014  . Hyperlipidemia   . Hypertension   . Syncope 08/14/2013    Past  Surgical History:  Procedure Laterality Date  . OOPHORECTOMY     ectopic     reports that she quit smoking about 3 years ago. Her smoking use included cigarettes. She started smoking about 46 years ago. She has a 8.20 pack-year smoking history. She has never used smokeless tobacco. She reports that she does not drink alcohol. No history on file for drug.  Allergies  Allergen Reactions  . Garlic Itching  . Penicillins Itching    Did it involve swelling of the face/tongue/throat, SOB, or low BP? Unknown Did it involve sudden or severe rash/hives, skin peeling, or any reaction on the inside of your mouth or nose? Unknown Did you need to seek medical attention at a hospital or doctor's office? Unknown When did it last happen?Unknown If all above answers are "NO", may proceed with cephalosporin use.    Family History  Problem Relation Age of Onset  . Hypertension Mother   . Cancer Maternal Aunt        colon cancer     Prior to Admission medications   Medication Sig Start Date End Date Taking? Authorizing Provider  AgaMatrix Ultra-Thin Lancets MISC Check blood glucose twice daily before meals 11/25/18   Mack Hook, MD  ARIPiprazole Lauroxil ER (ARISTADA) 441 MG/1.6ML PRSY Inject into the muscle. Every 4 weeks or once a month    [provider]  aspirin 81 MG tablet Take 1 tablet (81 mg total) by mouth daily. 06/07/15  Lupita Dawn, MD  benztropine (COGENTIN) 1 MG tablet Take 1 tablet (1 mg total) by mouth 2 (two) times daily. 09/29/16   Mack Hook, MD  carvedilol (COREG) 3.125 MG tablet Take 1 tablet (3.125 mg total) by mouth 2 (two) times daily. 04/21/18   Mack Hook, MD  cyclobenzaprine (FLEXERIL) 10 MG tablet TAKE 1 TABLET BY MOUTH THREE TIMES A DAY AS NEEDED FOR MUSCLE SPASM 01/10/19   Mack Hook, MD  glucose blood (AGAMATRIX PRESTO TEST) test strip Check blood glucose twice daily before meals 11/25/18   Mack Hook, MD   hydrochlorothiazide (HYDRODIURIL) 25 MG tablet 1 cap in the morning once daily. 04/21/18   Mack Hook, MD  losartan (COZAAR) 50 MG tablet Take 1 tablet (50 mg total) by mouth daily. 04/21/18   Mack Hook, MD  meloxicam (MOBIC) 15 MG tablet TAKE 1 TABLET BY MOUTH DAILY WITH FOOD FOR LEG PAIN 01/10/19   Mack Hook, MD  metFORMIN (GLUCOPHAGE-XR) 500 MG 24 hr tablet TAKE 1 TABLET BY MOUTH TWICE A DAY WITH MEALS 07/12/18   Mack Hook, MD  omeprazole (PRILOSEC) 20 MG capsule TAKE 1 CAPSULE BY MOUTH TWICE A DAY 06/24/18   Mack Hook, MD  pregabalin (LYRICA) 50 MG capsule Take 1 capsule (50 mg total) by mouth 3 (three) times daily. 12/29/18   Mack Hook, MD  rosuvastatin (CRESTOR) 20 MG tablet 1 tab by mouth daily with evening meal 07/20/18   Mack Hook, MD  traZODone (DESYREL) 100 MG tablet Take 100 mg by mouth at bedtime.    [provider]  triamcinolone cream (KENALOG) 0.1 % Apply twice daily with Terbinafine to rash until resolved. 04/21/18   Mack Hook, MD  venlafaxine (EFFEXOR) 37.5 MG tablet Take 37.5 mg by mouth 2 (two) times daily. 1 every morning    [provider]    Physical Exam: Vitals:   02/11/19 1330 02/11/19 1400 02/11/19 1412 02/11/19 1442  BP: 94/62 (!) 109/55    Pulse: (!) 104 (!) 101  97  Resp: (!) 25 20  (!) 21  Temp:      TempSrc:      SpO2: 99% 92%  93%  Weight:   102.1 kg     Constitutional: Obese, not in acute distress Vitals:   02/11/19 1330 02/11/19 1400 02/11/19 1412 02/11/19 1442  BP: 94/62 (!) 109/55    Pulse: (!) 104 (!) 101  97  Resp: (!) 25 20  (!) 21  Temp:      TempSrc:      SpO2: 99% 92%  93%  Weight:   102.1 kg    Neck: normal, supple, no masses, no thyromegaly Respiratory: Decreased air entry on the bases, normal respiratory effort. No accessory muscle use.  Cardiovascular: Regular rate and rhythm, no murmurs / rubs / gallops. No extremity edema.  Abdomen: no  tenderness, no masses palpated. No hepatosplenomegaly. Bowel sounds positive.  Musculoskeletal: no clubbing / cyanosis. No joint deformity upper and lower extremities.   Skin: no rashes, lesions, ulcers. No induration Neurologic: CN 2-12 grossly intact.  Strength 5/5 in all 4.  Psychiatric: Alert and awake but very slow to response, very poor historian.  Oriented Foley Catheter:None  Labs on Admission: I have personally reviewed following labs and imaging studies  CBC: Recent Labs  Lab 02/11/19 1220  WBC 8.0  NEUTROABS 6.2  HGB 13.8  HCT 43.2  MCV 90.8  PLT [id number]   Basic Metabolic Panel: Recent Labs  Lab 02/11/19 1220  NA  136  K 4.4  CL 101  CO2 26  GLUCOSE 94  BUN 34*  CREATININE 1.50*  CALCIUM 7.5*   GFR: Estimated Creatinine Clearance: 43.1 mL/min (A) (by C-G formula based on SCr of 1.5 mg/dL (H)). Liver Function Tests: Recent Labs  Lab 02/11/19 1220  AST 25  ALT 14  ALKPHOS 43  BILITOT 0.7  PROT 6.8  ALBUMIN 3.1*   No results for input(s): LIPASE, AMYLASE in the last 168 hours. No results for input(s): AMMONIA in the last 168 hours. Coagulation Profile: No results for input(s): INR, PROTIME in the last 168 hours. Cardiac Enzymes: No results for input(s): CKTOTAL, CKMB, CKMBINDEX, TROPONINI in the last 168 hours. BNP (last 3 results) No results for input(s): PROBNP in the last 8760 hours. HbA1C: No results for input(s): HGBA1C in the last 72 hours. CBG: No results for input(s): GLUCAP in the last 168 hours. Lipid Profile: Recent Labs    02/11/19 1220  TRIG 75   Thyroid Function Tests: No results for input(s): TSH, T4TOTAL, FREET4, T3FREE, THYROIDAB in the last 72 hours. Anemia Panel: Recent Labs    02/11/19 1220  FERRITIN 166   Urine analysis:    Component Value Date/Time   COLORURINE YELLOW 06/04/2010 [place] 06/04/2010 1334   LABSPEC 1.024 06/04/2010 1334   PHURINE 6.5 06/04/2010 1334   GLUCOSEU NEGATIVE 06/04/2010  1334   HGBUR NEGATIVE 06/04/2010 1334   BILIRUBINUR NEG 08/14/2013 [place] 06/04/2010 1334   PROTEINUR NEG 08/14/2013 1116   PROTEINUR NEGATIVE 06/04/2010 1334   UROBILINOGEN 0.2 08/14/2013 1116   UROBILINOGEN 0.2 06/04/2010 1334   NITRITE NEG 08/14/2013 1116   NITRITE NEGATIVE 06/04/2010 1334   LEUKOCYTESUR Negative 08/14/2013 1116    Radiological Exams on Admission: DG Chest Port 1 View  Result Date: 02/11/2019 CLINICAL DATA:  COVID-19 positive.  Shortness of breath. EXAM: PORTABLE CHEST 1 VIEW COMPARISON:  June 15, 2012 FINDINGS: The cardiomediastinal silhouette is stable. No pneumothorax. The left lung is clear. There is infiltrate in the right base. No other interval changes or acute abnormalities. IMPRESSION: Developing infiltrate in the right base consistent with the patient's history of COVID-19. No other abnormalities. 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  Lives with husband at home.  Her husband is currently at [facility]. She was supposed to get bamlanivumab infusion today but she was found to be hypoxic on room air.  Currently on 4 L of oxygen.  Chest x-ray showed developing infiltrate on the right base. Started on Decadron, remdesivir, given a dose of Actemra.  Monitor inflammatory markers.  Start on vitamin C, and zinc  AKI: Found to be dehydrated on presentation.  Baseline creatinine normal.  Creatinine 1.5 today.  Continue IV fluids for now.  Check BMP tomorrow.   Consider stopping IV fluids soon.  History of diabetes type 2: Continue sliding scale insulin.  On Metformin at home  Hypertension: Currently blood pressure stable.  Continue  current medicines  History of anxiety and depression: Continue home medications.  As per the report, she is dependent upon her husband for her care due to her mental health.  Hyperlipidemia: Continue statin     Severity of Illness: The appropriate patient status for this patient is INPATIENT.  DVT prophylaxis: Lovenox Code Status: Full code Family Communication: None present at the bedside.  Husband at [facility] being treated for Covid Consults called: None     Shelly Coss MD [facility] Pager [phone]  If 7PM-7AM, please contact night-coverage [email or web address] Password [email or web address]  02/11/2019, 3:10 PM

## 2019-02-12 LAB — COMPREHENSIVE METABOLIC PANEL
ALT: 17 U/L (ref 0–44)
AST: 27 U/L (ref 15–41)
Albumin: 3.3 g/dL — ABNORMAL LOW (ref 3.5–5.0)
Alkaline Phosphatase: 48 U/L (ref 38–126)
Anion gap: 11 (ref 5–15)
BUN: 32 mg/dL — ABNORMAL HIGH (ref 8–23)
CO2: 26 mmol/L (ref 22–32)
Calcium: 8.8 mg/dL — ABNORMAL LOW (ref 8.9–10.3)
Chloride: 100 mmol/L (ref 98–111)
Creatinine, Ser: 1.04 mg/dL — ABNORMAL HIGH (ref 0.44–1.00)
GFR calc Af Amer: >60 mL/min (ref >60)
GFR calc non Af Amer: 58 mL/min — ABNORMAL LOW (ref >60)
Glucose, Bld: 137 mg/dL — ABNORMAL HIGH (ref 70–99)
Potassium: 4.3 mmol/L (ref 3.5–5.1)
Sodium: 137 mmol/L (ref 135–145)
Total Bilirubin: 0.7 mg/dL (ref 0.3–1.2)
Total Protein: 7.5 g/dL (ref 6.5–8.1)

## 2019-02-12 LAB — CBC
HCT: 43.1 % (ref 36.0–46.0)
Hemoglobin: 13.5 g/dL (ref 12.0–15.0)
MCH: 28.4 pg (ref 26.0–34.0)
MCHC: 31.3 g/dL (ref 30.0–36.0)
MCV: 90.7 fL (ref 80.0–100.0)
Platelets: 302 10*3/uL (ref 150–400)
RBC: 4.75 MIL/uL (ref 3.87–5.11)
RDW: 16.8 % — ABNORMAL HIGH (ref 11.5–15.5)
WBC: 7.7 10*3/uL (ref 4.0–10.5)
nRBC: 0 % (ref 0.0–0.2)

## 2019-02-12 LAB — GLUCOSE, CAPILLARY: Glucose-Capillary: 119 mg/dL — ABNORMAL HIGH (ref 70–99)

## 2019-02-12 LAB — FERRITIN: Ferritin: 251 ng/mL (ref 11–307)

## 2019-02-12 LAB — C-REACTIVE PROTEIN: CRP: 23.2 mg/dL — ABNORMAL HIGH (ref <1.0)

## 2019-02-12 LAB — D-DIMER, QUANTITATIVE: D-Dimer, Quant: 0.69 ug/mL-FEU — ABNORMAL HIGH (ref 0.00–0.50)

## 2019-02-12 MED ORDER — ENOXAPARIN SODIUM 60 MG/0.6ML ~~LOC~~ SOLN
50.0000 mg | SUBCUTANEOUS | Status: DC
  Administered 2019-02-12 – 2019-02-14 (×3): 50 mg via SUBCUTANEOUS
  Filled 2019-02-12 (×4): qty 0.6

## 2019-02-12 MED ORDER — METHYLPREDNISOLONE SODIUM SUCC 125 MG IJ SOLR
50.0000 mg | Freq: Two times a day (BID) | INTRAMUSCULAR | Status: DC
  Administered 2019-02-12 – 2019-02-15 (×7): 50 mg via INTRAVENOUS
  Filled 2019-02-12 (×7): qty 2

## 2019-02-12 NOTE — Progress Notes (Signed)
PROGRESS NOTE    Kristine Atkinson  WJX:[id number] DOB: [birth year] DOA: 02/11/2019 PCP: Julieanne Manson, MD   Brief Narrative:  Kristine Atkinson is Kristine Atkinson 64 y.o. female with medical history significant of hypertension, diabetes, chronic pain syndrome, cocaine abuse, fibromyalgia hyperlipidemia, extensive psych history who was sent from [facility] infusion clinic after she was found to be  hypoxic in the range of 80s on room air.  She was diagnosed with Covid in 02/06/2019 and was supposed to get bamlanivumab infusion today.  Patient is Kristine Atkinson very poor historian.  Her husband is currently at [facility] being treated for Covid pneumonia. As per some of the information she provides, she usually lives with her husband at home.  She started feeling more shortness of breath, cough, had diarrhea since last few days.  She says she was also feeling thirsty and tired.  She was supposed to get infusion treatment today. Patient seen and examined at the bedside in the emergency department.  She says she feels better after she was put on oxygen. She denied any fever, abdominal pain, nausea, vomiting, dysuria.  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  Baseline creatinine normal.  Creatinine 1.5 peaked at 1.5 on presentation.  Improved to 1.04.  Will stop IVF.  Holding ace and thiazide.  History of diabetes type 2: Continue sliding scale insulin.  On Metformin at home.  A1c is 6.4.  Continue to monitor.   Hypertension: Currently blood pressure stable.  Continue current medicines.  Coreg.  HCTZ and losartan on hold.    History of anxiety and depression: Continue home medications.  As per the report, she is dependent upon her husband for her care due to her mental health. Continue venlafaxine, trazodone, benztropine, gets aristada q 4 weeks  Hyperlipidemia: Continue statin  DVT prophylaxis: lovenox Code Status: full Family Communication: none at bedside Disposition Plan:  . Patient came from: home           . Anticipated d/c place: home . Barriers to d/c OR conditions which need to be met to effect Kristine Atkinson safe d/c: improvement in oxygen need  Consultants:   none  Procedures:   none  Antimicrobials:  Anti-infectives (From admission, onward)   Start     Dose/Rate Route Frequency Ordered Stop   02/12/19 1000  remdesivir 100 mg in sodium chloride 0.9 % 100 mL IVPB     100 mg 200 mL/hr over 30 Minutes Intravenous Daily 02/11/19 1401 02/16/19 0959   02/11/19 1430  remdesivir 200 mg in sodium chloride 0.9% 250 mL IVPB     200 mg  580 mL/hr over 30 Minutes Intravenous Once 02/11/19 1401 02/11/19 1548     Subjective: Asking to go home Notes she has to take care of bills Discussed that it's important that she stay for further care She was  Agreeable after hearing that her husband likely to d/c home today  Objective: Vitals:   02/12/19 0400 02/12/19 0430 02/12/19 0726 02/12/19 1118  BP:  109/73 135/75 (!) 112/59  Pulse: 72 79 85 83  Resp: 14 15 (!) 21 20  Temp:  97.7 F (36.5 C) 98.1 F (36.7 C) 98.1 F (36.7 C)  TempSrc:  Oral Oral Oral  SpO2:  96% 93% 91%    Weight:      Height:        Intake/Output Summary (Last 24 hours) at 02/12/2019 1145 Last data filed at 02/12/2019 1000 Gross per 24 hour  Intake 1308 ml  Output --  Net 1308 ml   Filed Weights   02/11/19 1412 02/11/19 2016  Weight: 102.1 kg 102.1 kg    Examination:  General exam: Appears calm and comfortable  Respiratory system: Clear to auscultation. Respiratory effort normal. Cardiovascular system: S1 & S2 heard, RRR. Gastrointestinal system: Abdomen is nondistended, soft and nontender. Central nervous system: Alert and oriented. No focal neurological deficits. Extremities: no LEE Skin: No rashes, lesions or ulcers Psychiatry: Judgement and insight appear normal. Mood & affect appropriate.     Data Reviewed: I have personally reviewed following labs and imaging studies  CBC: Recent Labs  Lab 02/11/19 1220 02/12/19 0930  WBC 8.0 7.7  NEUTROABS 6.2  --   HGB 13.8 13.5  HCT 43.2 43.1  MCV 90.8 90.7  PLT 259 302   Basic Metabolic Panel: Recent Labs  Lab 02/11/19 1220 02/12/19 0930  NA 136 137  K 4.4 4.3  CL 101 100  CO2 26 26  GLUCOSE 94 137*  BUN 34* 32*  CREATININE 1.50* 1.04*  CALCIUM 7.5* 8.8*   GFR: Estimated Creatinine Clearance: 62.2 mL/min (Kristine Atkinson) (by C-G formula based on SCr of 1.04 mg/dL (H)). Liver Function Tests: Recent Labs  Lab 02/11/19 1220 02/12/19 0930  AST 25 27  ALT 14 17  ALKPHOS 43 48  BILITOT 0.7 0.7  PROT 6.8 7.5  ALBUMIN 3.1* 3.3*   No results for input(s): LIPASE, AMYLASE in the last 168 hours. No results for input(s): AMMONIA in the last 168 hours. Coagulation Profile: No results for input(s): INR, PROTIME in the last 168 hours. Cardiac Enzymes: No results for input(s): CKTOTAL, CKMB, CKMBINDEX, TROPONINI in the last 168 hours. BNP (last 3 results) No results for input(s): PROBNP in the last 8760 hours. HbA1C: No results for input(s): HGBA1C in the last 72 hours. CBG: Recent Labs  Lab 02/11/19 1651 02/11/19 2108   GLUCAP 130* 174*   Lipid Profile: Recent Labs    02/11/19 1220  TRIG 75   Thyroid Function Tests: No results for input(s): TSH, T4TOTAL, FREET4, T3FREE, THYROIDAB in the last 72 hours. Anemia Panel: Recent Labs    02/11/19 1220 02/12/19 0930  FERRITIN 166 251   Sepsis Labs: Recent Labs  Lab 02/11/19 1220  PROCALCITON 0.31  LATICACIDVEN 1.2    Recent Results (from the past 240 hour(s))  Novel Coronavirus, NAA ([facility])     Status: Abnormal   Collection Time: 02/06/19 10:50 AM   Specimen: Nasopharyngeal(NP) swabs in vial transport medium   NASOPHARYNGE  TESTING  Result Value Ref Range Status   SARS-CoV-2, NAA Detected ([place]) Not Detected Final  Comment: This nucleic acid amplification test was developed and its performance characteristics determined by [employer]. Nucleic acid amplification tests include RT-PCR and TMA. This test has not been FDA cleared or approved. This test has been authorized by FDA under an Emergency Use Authorization (EUA). This test is only authorized for the duration of time the declaration that circumstances exist justifying the authorization of the emergency use of in vitro diagnostic tests for detection of SARS-CoV-2 virus and/or diagnosis of COVID-19 infection under section 564(b)(1) of the Act, 21 U.S.C. 147WGN-5([place]) (1), unless the authorization is terminated or revoked sooner. When diagnostic testing is negative, the possibility of Travious Vanover false negative result should be considered in the context of Alyse Kathan patient's recent exposures and the presence of clinical signs and symptoms consistent with COVID-19. An individual without symptoms of COVID-19 and who is not shedding SARS-CoV-2 virus wo uld expect to have Jaquayla Hege negative (not detected) result in this assay.   Blood Culture (routine x 2)     Status: None (Preliminary result)   Collection Time: 02/11/19 12:20 PM   Specimen: BLOOD  Result Value Ref Range Status   Specimen Description    Final    BLOOD RIGHT ANTECUBITAL Performed at [facility], [address] [place], [place] [address]    Special Requests   Final    BOTTLES DRAWN AEROBIC AND ANAEROBIC Blood Culture adequate volume Performed at [facility], [address] [place], [place] [address]    Culture PENDING  Incomplete   Report Status PENDING  Incomplete         Radiology Studies: DG Chest Port 1 View  Result Date: 02/11/2019 CLINICAL DATA:  COVID-19 positive.  Shortness of breath. EXAM: PORTABLE CHEST 1 VIEW COMPARISON:  June 15, 2012 FINDINGS: The cardiomediastinal silhouette is stable. No pneumothorax. The left lung is clear. There is infiltrate in the right base. No other interval changes or acute abnormalities. IMPRESSION: Developing infiltrate in the right base consistent with the patient's history of COVID-19. No other abnormalities. Electronically Signed   By: Gerome Sam III M.D   On: 02/11/2019 12:53        Scheduled Meds: . vitamin C  500 mg Oral Daily  . aspirin  81 mg Oral Daily  . benztropine  1 mg Oral BID  . carvedilol  3.125 mg Oral BID WC  . enoxaparin (LOVENOX) injection  50 mg Subcutaneous Q24H  . insulin aspart  0-5 Units Subcutaneous QHS  . insulin aspart  0-9 Units Subcutaneous TID WC  . methylPREDNISolone (SOLU-MEDROL) injection  50 mg Intravenous BID  . pantoprazole  40 mg Oral Daily  . pneumococcal 23 valent vaccine  0.5 mL Intramuscular Tomorrow-1000  . rosuvastatin  20 mg Oral q1800  . traZODone  100 mg Oral QHS  . venlafaxine  37.5 mg Oral BID  . zinc sulfate  220 mg Oral Daily   Continuous Infusions: . sodium chloride 75 mL/hr at 02/11/19 1547  . remdesivir 100 mg in NS 100 mL 100 mg (02/12/19 0925)     LOS: 1 day    Time spent: over 30 min    Lacretia Nicks, MD [facility]   To contact the attending provider between 7A-7P or the covering provider during after hours 7P-7A, please log into the web site  [email or web address] and access using universal [place] password for that web site. If you do not have the password, please call the hospital operator.  02/12/2019, 11:45 AM

## 2019-02-12 NOTE — Plan of Care (Signed)
Pt updated husband, Gwyndolyn Saxon, who is also hospitalized with planned d/c today. VSS. Pt sats WNL on RA at rest, but pt requiring 3L [place] with activity. Will continue to ween as tolerated. Remdesivir infusing. No complaints at this time.   Problem: Education: Goal: Knowledge of General Education information will improve Description: Including pain rating scale, medication(s)/side effects and non-pharmacologic comfort measures Outcome: Progressing   Problem: Health Behavior/Discharge Planning: Goal: Ability to manage health-related needs will improve Outcome: Progressing   Problem: Clinical Measurements: Goal: Ability to maintain clinical measurements within normal limits will improve Outcome: Progressing Goal: Will remain free from infection Outcome: Progressing Goal: Diagnostic test results will improve Outcome: Progressing Goal: Respiratory complications will improve Outcome: Progressing Goal: Cardiovascular complication will be avoided Outcome: Progressing   Problem: Activity: Goal: Risk for activity intolerance will decrease Outcome: Progressing   Problem: Nutrition: Goal: Adequate nutrition will be maintained Outcome: Progressing   Problem: Coping: Goal: Level of anxiety will decrease Outcome: Progressing   Problem: Elimination: Goal: Will not experience complications related to bowel motility Outcome: Progressing Goal: Will not experience complications related to urinary retention Outcome: Progressing   Problem: Pain Managment: Goal: General experience of comfort will improve Outcome: Progressing   Problem: Safety: Goal: Ability to remain free from injury will improve Outcome: Progressing   Problem: Skin Integrity: Goal: Risk for impaired skin integrity will decrease Outcome: Progressing   Problem: Education: Goal: Knowledge of risk factors and measures for prevention of condition will improve Outcome: Progressing   Problem: Coping: Goal: Psychosocial and  spiritual needs will be supported Outcome: Progressing   Problem: Respiratory: Goal: Will maintain a patent airway Outcome: Progressing Goal: Complications related to the disease process, condition or treatment will be avoided or minimized Outcome: Progressing   Problem: Fluid Volume: Goal: Hemodynamic stability will improve Outcome: Progressing   Problem: Clinical Measurements: Goal: Diagnostic test results will improve Outcome: Progressing Goal: Signs and symptoms of infection will decrease Outcome: Progressing   Problem: Respiratory: Goal: Ability to maintain adequate ventilation will improve Outcome: Progressing

## 2019-02-12 NOTE — Progress Notes (Signed)
[id number] pt from RM 146  Pt VSS No distress noted.  Pt up in chair on R/A 02 Setup PRN

## 2019-02-12 NOTE — Progress Notes (Signed)
Pt transferred to room 304 via wheelchair escorted by RN. All belongings (clothing, shoes) with patient.

## 2019-02-13 LAB — CBC WITH DIFFERENTIAL/PLATELET
Abs Immature Granulocytes: 0.07 10*3/uL (ref 0.00–0.07)
Basophils Absolute: 0 10*3/uL (ref 0.0–0.1)
Basophils Relative: 0 %
Eosinophils Absolute: 0 10*3/uL (ref 0.0–0.5)
Eosinophils Relative: 0 %
HCT: 40.2 % (ref 36.0–46.0)
Hemoglobin: 12.8 g/dL (ref 12.0–15.0)
Immature Granulocytes: 1 %
Lymphocytes Relative: 12 %
Lymphs Abs: 1.2 10*3/uL (ref 0.7–4.0)
MCH: 28.5 pg (ref 26.0–34.0)
MCHC: 31.8 g/dL (ref 30.0–36.0)
MCV: 89.5 fL (ref 80.0–100.0)
Monocytes Absolute: 0.4 10*3/uL (ref 0.1–1.0)
Monocytes Relative: 4 %
Neutro Abs: 8.4 10*3/uL — ABNORMAL HIGH (ref 1.7–7.7)
Neutrophils Relative %: 83 %
Platelets: 351 10*3/uL (ref 150–400)
RBC: 4.49 MIL/uL (ref 3.87–5.11)
RDW: 16.7 % — ABNORMAL HIGH (ref 11.5–15.5)
WBC: 10 10*3/uL (ref 4.0–10.5)
nRBC: 0 % (ref 0.0–0.2)

## 2019-02-13 LAB — COMPREHENSIVE METABOLIC PANEL
ALT: 15 U/L (ref 0–44)
AST: 23 U/L (ref 15–41)
Albumin: 3 g/dL — ABNORMAL LOW (ref 3.5–5.0)
Alkaline Phosphatase: 41 U/L (ref 38–126)
Anion gap: 9 (ref 5–15)
BUN: 30 mg/dL — ABNORMAL HIGH (ref 8–23)
CO2: 24 mmol/L (ref 22–32)
Calcium: 8.6 mg/dL — ABNORMAL LOW (ref 8.9–10.3)
Chloride: 104 mmol/L (ref 98–111)
Creatinine, Ser: 0.92 mg/dL (ref 0.44–1.00)
GFR calc Af Amer: >60 mL/min (ref >60)
GFR calc non Af Amer: >60 mL/min (ref >60)
Glucose, Bld: 145 mg/dL — ABNORMAL HIGH (ref 70–99)
Potassium: 4.4 mmol/L (ref 3.5–5.1)
Sodium: 137 mmol/L (ref 135–145)
Total Bilirubin: 0.5 mg/dL (ref 0.3–1.2)
Total Protein: 6.7 g/dL (ref 6.5–8.1)

## 2019-02-13 LAB — GLUCOSE, CAPILLARY
Glucose-Capillary: 106 mg/dL — ABNORMAL HIGH (ref 70–99)
Glucose-Capillary: 141 mg/dL — ABNORMAL HIGH (ref 70–99)
Glucose-Capillary: 216 mg/dL — ABNORMAL HIGH (ref 70–99)
Glucose-Capillary: 180 mg/dL — ABNORMAL HIGH (ref 70–99)
Glucose-Capillary: 198 mg/dL — ABNORMAL HIGH (ref 70–99)
Glucose-Capillary: 151 mg/dL — ABNORMAL HIGH (ref 70–99)
Glucose-Capillary: 149 mg/dL — ABNORMAL HIGH (ref 70–99)

## 2019-02-13 LAB — C-REACTIVE PROTEIN: CRP: 12.9 mg/dL — ABNORMAL HIGH (ref <1.0)

## 2019-02-13 LAB — PHOSPHORUS: Phosphorus: 2.6 mg/dL (ref 2.5–4.6)

## 2019-02-13 LAB — D-DIMER, QUANTITATIVE: D-Dimer, Quant: 0.44 ug/mL-FEU (ref 0.00–0.50)

## 2019-02-13 LAB — MAGNESIUM: Magnesium: 2 mg/dL (ref 1.7–2.4)

## 2019-02-13 LAB — FERRITIN: Ferritin: 333 ng/mL — ABNORMAL HIGH (ref 11–307)

## 2019-02-13 MED ORDER — INSULIN DETEMIR 100 UNIT/ML ~~LOC~~ SOLN
5.0000 [IU] | Freq: Two times a day (BID) | SUBCUTANEOUS | Status: DC
  Administered 2019-02-13 – 2019-02-15 (×5): 5 [IU] via SUBCUTANEOUS
  Filled 2019-02-13 (×6): qty 0.05

## 2019-02-13 NOTE — Progress Notes (Signed)
PHARMACY - PHYSICIAN COMMUNICATION CRITICAL VALUE ALERT - BLOOD CULTURE IDENTIFICATION (BCID)  Kristine Atkinson is an 63 y.o. female who presented to [facility] on 02/11/2019 with a chief complaint of SOB with COVID pneumonia.   Assessment:  Only one set of blood cultures available, aerobic bottle is growing GPC. Afebrile, WBC wnl on 2L [place]. Would not start any antibiotics and consider repeating another set of blood cultures. MD agreed.  Name of physician (or Provider) Contacted: Dr. Darene Lamer Opyd  Current antibiotics: none  Changes to prescribed antibiotics recommended: none.  Recommendations accepted by provider   Benetta Spar, PharmD, BCPS, [place] Clinical Pharmacist  Please check AMION for all [place] phone numbers After 10:00 PM, call [employer] [phone]

## 2019-02-13 NOTE — Progress Notes (Signed)
PROGRESS NOTE    Kristine Atkinson  GYJ:[id number] DOB: Jun 03, 1955 DOA: 02/11/2019 PCP: Mack Hook, MD   Brief Narrative:  Kristine Atkinson is Kristine Atkinson 64 y.o. female with medical history significant of hypertension, diabetes, chronic pain syndrome, cocaine abuse, fibromyalgia hyperlipidemia, extensive psych history who was sent from [facility] infusion clinic after she was found to be  hypoxic in the range of 80s on room air.  She was diagnosed with Covid in 02/06/2019 and was supposed to get bamlanivumab infusion today.  Patient is Kristine Atkinson very poor historian.  Her husband is currently at [facility] being treated for Covid pneumonia. As per some of the information she provides, she usually lives with her husband at home.  She started feeling more shortness of breath, cough, had diarrhea since last few days.  She says she was also feeling thirsty and tired.  She was supposed to get infusion treatment today. Patient seen and examined at the bedside in the emergency department.  She says she feels better after she was put on oxygen. She denied any fever, abdominal pain, nausea, vomiting, dysuria.  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  Baseline creatinine normal.  Creatinine 1.5 peaked at 1.5 on presentation.  Improved to 1.04.  Will stop IVF.  Holding ace and thiazide.  History of diabetes type 2: Continue sliding scale insulin, add basal.  On Metformin at home.  A1c is 6.4.  Continue to monitor.  Hypertension: Currently blood pressure stable.  Continue current medicines.  Coreg.  HCTZ and losartan on hold.    History of anxiety and depression: Continue home medications.  As per the report, she is dependent upon her husband for her care due to her mental health. Continue venlafaxine, trazodone, benztropine, gets aristada q 4 weeks  Hyperlipidemia: Continue statin  DVT prophylaxis: lovenox Code Status: full Family Communication: none at bedside - called husband, no answer Disposition Plan:  . Patient came from: home           . Anticipated d/c place: home . Barriers to d/c OR conditions which need to be met to effect Damari Hiltz safe d/c: improvement in oxygen need, completion of remdesivir  Consultants:   none  Procedures:   none  Antimicrobials:  Anti-infectives (From admission, onward)   Start     Dose/Rate Route Frequency Ordered Stop   02/12/19 1000  remdesivir 100 mg in sodium chloride 0.9 % 100 mL IVPB     100 mg 200 mL/hr over 30 Minutes Intravenous Daily 02/11/19 1401 02/16/19 0959   02/11/19 1430  remdesivir 200 mg in sodium chloride  0.9% 250 mL IVPB     200 mg 580 mL/hr over 30 Minutes Intravenous Once 02/11/19 1401 02/11/19 1548     Subjective: Asking again to go home today Discussed need to stay for additional therapy with continued O2 requirement  Objective: Vitals:   02/12/19 1528 02/12/19 2120 02/13/19 0405 02/13/19 0708  BP: (!) 126/59 110/74 125/64 (!) 129/98  Pulse: 90 82 87 89  Resp: 19 (!) _0 Temp: 97.8 F (36.6 C) 97.7 F (36.5 C) 97.6 F (36.4 C) 97.7 F (36.5 C)  TempSrc: Oral Oral Oral Oral  SpO2: (!) 89% 92%  90% 98%  Weight:      Height:        Intake/Output Summary (Last 24 hours) at 02/13/2019 1242 Last data filed at 02/13/2019 0300 Gross per 24 hour  Intake 340 ml  Output -  Net 340 ml   Filed Weights   02/11/19 1412 02/11/19 2016  Weight: 102.1 kg 102.1 kg    Examination:  General: No acute distress. Cardiovascular: Heart sounds show Kristine Atkinson regular rate, and rhythm. . Lungs: Clear to auscultation bilaterally . Abdomen: Soft, nontender, nondistended  Neurological: Alert and oriented 3. Moves all extremities 4 . Cranial nerves II through XII grossly intact. Skin: Warm and dry. No rashes or lesions. Extremities: No clubbing or cyanosis. No edema.   Data Reviewed: I have personally reviewed following labs and imaging studies  CBC: Recent Labs  Lab 02/11/19 1220 02/12/19 0930 02/13/19 0535  WBC 8.0 7.7 10.0  NEUTROABS 6.2  --  8.4*  HGB 13.8 13.5 12.8  HCT 43.2 43.1 40.2  MCV 90.8 90.7 89.5  PLT 259 302 542   Basic Metabolic Panel: Recent Labs  Lab 02/11/19 1220 02/12/19 0930 02/13/19 0535  NA 136 137 137  K 4.4 4.3 4.4  CL 101 100 104  CO2 _1 GLUCOSE 94 137* 145*  BUN 34* 32* 30*  CREATININE 1.50* 1.04* 0.92  CALCIUM 7.5* 8.8* 8.6*  MG  --   --  2.0  PHOS  --   --  2.6   GFR: Estimated Creatinine Clearance: 70.3 mL/min (by C-G formula based on SCr of 0.92 mg/dL). Liver Function Tests: Recent Labs  Lab 02/11/19 1220 02/12/19 0930 02/13/19 0535  AST _2 ALT _3 ALKPHOS 43 48 41  BILITOT 0.7 0.7 0.5  PROT 6.8 7.5 6.7  ALBUMIN 3.1* 3.3* 3.0*   No results for input(s): LIPASE, AMYLASE in the last 168 hours. No results for input(s): AMMONIA in the last 168 hours. Coagulation Profile: No results for input(s): INR, PROTIME in the last 168 hours. Cardiac Enzymes: No results for input(s): CKTOTAL, CKMB, CKMBINDEX, TROPONINI in the last 168 hours. BNP (last 3 results) No results for input(s): PROBNP in the last 8760 hours. HbA1C: No  results for input(s): HGBA1C in the last 72 hours. CBG: Recent Labs  Lab 02/12/19 1129 02/12/19 1613 02/12/19 2118 02/13/19 0840 02/13/19 1140  GLUCAP 141* 216* 119* 180* 198*   Lipid Profile: Recent Labs    02/11/19 1220  TRIG 75   Thyroid Function Tests: No results for input(s): TSH, T4TOTAL, FREET4, T3FREE, THYROIDAB in the last 72 hours. Anemia Panel: Recent Labs    02/12/19 0930 02/13/19 0535  FERRITIN 251 333*   Sepsis Labs: Recent Labs  Lab 02/11/19 1220  PROCALCITON 0.31  LATICACIDVEN 1.2    Recent Results (from the past 240 hour(s))  Novel Coronavirus, NAA ([facility])  Status: Abnormal   Collection Time: 02/06/19 10:50 AM   Specimen: Nasopharyngeal(NP) swabs in vial transport medium   NASOPHARYNGE  TESTING  Result Value Ref Range Status   SARS-CoV-2, NAA Detected (Kristine Atkinson) Not Detected Final    Comment: This nucleic acid amplification test was developed and its performance characteristics determined by [employer]. Nucleic acid amplification tests include RT-PCR and TMA. This test has not been FDA cleared or approved. This test has been authorized by FDA under an Emergency Use Authorization (EUA). This test is only authorized for the duration of time the declaration that circumstances exist justifying the authorization of the emergency use of in vitro diagnostic tests for detection of SARS-CoV-2 virus and/or diagnosis of COVID-19 infection under section 564(b)(1) of the Act, 21 U.S.C. 229NLG-9(Q) (1), unless the authorization is terminated or revoked sooner. When diagnostic testing is negative, the possibility of Kristine Atkinson false negative result should be considered in the context of Kristine Atkinson patient's recent exposures and the presence of clinical signs and symptoms consistent with COVID-19. An individual without symptoms of COVID-19 and who is not shedding SARS-CoV-2 virus wo uld expect to have Kristine Atkinson negative (not detected) result in this assay.   Blood Culture  (routine x 2)     Status: None (Preliminary result)   Collection Time: 02/11/19 12:20 PM   Specimen: BLOOD  Result Value Ref Range Status   Specimen Description   Final    BLOOD RIGHT ANTECUBITAL Performed at [facility] [address], [place], [place] [address]    Special Requests   Final    BOTTLES DRAWN AEROBIC AND ANAEROBIC Blood Culture adequate volume   Culture   Final    NO GROWTH 2 DAYS Performed at [facility], [place] [address], [name], [place] [address]    Report Status PENDING  Incomplete         Radiology Studies: DG Chest Port 1 View  Result Date: 02/11/2019 CLINICAL DATA:  COVID-19 positive.  Shortness of breath. EXAM: PORTABLE CHEST 1 VIEW COMPARISON:  June 15, 2012 FINDINGS: The cardiomediastinal silhouette is stable. No pneumothorax. The left lung is clear. There is infiltrate in the right base. No other interval changes or acute abnormalities. IMPRESSION: Developing infiltrate in the right base consistent with the patient's history of COVID-19. No other abnormalities. Electronically Signed   By: Dorise Bullion III M.D   On: 02/11/2019 12:53        Scheduled Meds: . vitamin C  500 mg Oral Daily  . aspirin  81 mg Oral Daily  . benztropine  1 mg Oral BID  . carvedilol  3.125 mg Oral BID WC  . enoxaparin (LOVENOX) injection  50 mg Subcutaneous Q24H  . insulin aspart  0-5 Units Subcutaneous QHS  . insulin aspart  0-9 Units Subcutaneous TID WC  . methylPREDNISolone (SOLU-MEDROL) injection  50 mg Intravenous BID  . pantoprazole  40 mg Oral Daily  . rosuvastatin  20 mg Oral q1800  . traZODone  100 mg Oral QHS  . venlafaxine  37.5 mg Oral BID  . zinc sulfate  220 mg Oral Daily   Continuous Infusions: . remdesivir 100 mg in NS 100 mL 100 mg (02/13/19 1007)     LOS: 2 days    Time spent: over 30 min    Fayrene Helper, MD [facility]   To contact the attending provider between 7A-7P or the covering provider during  after hours 7P-7A, please log into the web site [email or web address] and access  using universal [place] password for that web site. If you do not have the password, please call the hospital operator.  02/13/2019, 12:42 PM

## 2019-02-14 LAB — D-DIMER, QUANTITATIVE: D-Dimer, Quant: 0.52 ug/mL-FEU — ABNORMAL HIGH (ref 0.00–0.50)

## 2019-02-14 LAB — COMPREHENSIVE METABOLIC PANEL
ALT: 15 U/L (ref 0–44)
AST: 20 U/L (ref 15–41)
Albumin: 2.9 g/dL — ABNORMAL LOW (ref 3.5–5.0)
Alkaline Phosphatase: 40 U/L (ref 38–126)
Anion gap: 8 (ref 5–15)
BUN: 28 mg/dL — ABNORMAL HIGH (ref 8–23)
CO2: 25 mmol/L (ref 22–32)
Calcium: 8.6 mg/dL — ABNORMAL LOW (ref 8.9–10.3)
Chloride: 105 mmol/L (ref 98–111)
Creatinine, Ser: 0.95 mg/dL (ref 0.44–1.00)
GFR calc Af Amer: >60 mL/min (ref >60)
GFR calc non Af Amer: >60 mL/min (ref >60)
Glucose, Bld: 154 mg/dL — ABNORMAL HIGH (ref 70–99)
Potassium: 4.5 mmol/L (ref 3.5–5.1)
Sodium: 138 mmol/L (ref 135–145)
Total Bilirubin: 0.5 mg/dL (ref 0.3–1.2)
Total Protein: 6.5 g/dL (ref 6.5–8.1)

## 2019-02-14 LAB — CBC WITH DIFFERENTIAL/PLATELET
Abs Immature Granulocytes: 0.05 10*3/uL (ref 0.00–0.07)
Basophils Absolute: 0 10*3/uL (ref 0.0–0.1)
Basophils Relative: 0 %
Eosinophils Absolute: 0 10*3/uL (ref 0.0–0.5)
Eosinophils Relative: 0 %
HCT: 39.9 % (ref 36.0–46.0)
Hemoglobin: 12.8 g/dL (ref 12.0–15.0)
Immature Granulocytes: 1 %
Lymphocytes Relative: 13 %
Lymphs Abs: 1.1 10*3/uL (ref 0.7–4.0)
MCH: 28.6 pg (ref 26.0–34.0)
MCHC: 32.1 g/dL (ref 30.0–36.0)
MCV: 89.3 fL (ref 80.0–100.0)
Monocytes Absolute: 0.3 10*3/uL (ref 0.1–1.0)
Monocytes Relative: 3 %
Neutro Abs: 7.1 10*3/uL (ref 1.7–7.7)
Neutrophils Relative %: 83 %
Platelets: 409 10*3/uL — ABNORMAL HIGH (ref 150–400)
RBC: 4.47 MIL/uL (ref 3.87–5.11)
RDW: 16.8 % — ABNORMAL HIGH (ref 11.5–15.5)
WBC: 8.5 10*3/uL (ref 4.0–10.5)
nRBC: 0 % (ref 0.0–0.2)

## 2019-02-14 LAB — FERRITIN: Ferritin: 256 ng/mL (ref 11–307)

## 2019-02-14 LAB — GLUCOSE, CAPILLARY
Glucose-Capillary: 118 mg/dL — ABNORMAL HIGH (ref 70–99)
Glucose-Capillary: 134 mg/dL — ABNORMAL HIGH (ref 70–99)
Glucose-Capillary: 171 mg/dL — ABNORMAL HIGH (ref 70–99)
Glucose-Capillary: 193 mg/dL — ABNORMAL HIGH (ref 70–99)

## 2019-02-14 LAB — PHOSPHORUS: Phosphorus: 3.2 mg/dL (ref 2.5–4.6)

## 2019-02-14 LAB — MAGNESIUM: Magnesium: 1.9 mg/dL (ref 1.7–2.4)

## 2019-02-14 LAB — C-REACTIVE PROTEIN: CRP: 7.2 mg/dL — ABNORMAL HIGH (ref <1.0)

## 2019-02-14 NOTE — Progress Notes (Signed)
PROGRESS NOTE    ZONA PEDRO  MLY:[id number] DOB: Oct 16, 1955 DOA: 02/11/2019 PCP: Mack Hook, MD   Brief Narrative:  Kristine Atkinson is a 64 y.o. female with medical history significant of hypertension, diabetes, chronic pain syndrome, cocaine abuse, fibromyalgia hyperlipidemia, extensive psych history who was sent from [facility] infusion clinic after she was found to be  hypoxic in the range of 80s on room air.  She was diagnosed with Covid in 02/06/2019 and was supposed to get bamlanivumab infusion today.  Patient is a very poor historian.  Her husband is currently at [facility] being treated for Covid pneumonia. As per some of the information she provides, she usually lives with her husband at home.  She started feeling more shortness of breath, cough, had diarrhea since last few days.  She says she was also feeling thirsty and tired.  She was supposed to get infusion treatment on the day of admission.  She's improved with steroids and remdesivir.  She received actemra as well.  She'll complete her treatment on 2/3 and likely will be ready for discharge on that date.   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   AKI (acute kidney injury) ([place])   COVID-19   Pneumonia due to 2019 novel coronavirus  Covid 19 Pneumonia:  Currently on 2 L [facility] O2 screen ordered Continue steroids, remdesivir S/p actemra I/O, daily weights IS, OOB, prone as able Significantly elevated CRP, follow on steroids -> improving  COVID-19 Labs  Recent Labs    02/12/19 0930 02/13/19 0535 02/14/19 0517  DDIMER 0.69* 0.44 0.52*  FERRITIN 251 333* 256  CRP 23.2* 12.9* 7.2*    Lab Results  Component Value Date   SARSCOV2NAA Detected  (A) 02/06/2019   Gram Positive Rod Bacteremia: from 1/30 blood culture (only 1 obtained) -> repeat culture pending from 2/1.  Follow repeat culture, holding abx for now.  Afebrile, normal WBC count.  AKI: Found to be dehydrated on presentation.  Baseline creatinine normal.  Creatinine 1.5 peaked at 1.5 on presentation.  Improved.  Will stop IVF.  Holding ace and thiazide.  History of diabetes type 2: Continue sliding scale insulin, add basal.  On Metformin at home.  A1c is 6.4.  Continue to monitor.  Hypertension: Currently blood pressure stable.  Continue current medicines.  Coreg.  HCTZ and losartan on hold.    History of anxiety and depression: Continue home medications.  As per the report, she is dependent upon her husband for her care due to her mental health. Continue venlafaxine, trazodone, benztropine, gets aristada q 4 weeks  Hyperlipidemia: Continue statin  DVT prophylaxis: lovenox Code Status: full Family Communication: none at bedside - called husband, no answer on 2/2 Disposition Plan:  . Patient came from: home           . Anticipated d/c place: home . Barriers to d/c OR conditions which need to be met to effect a safe d/c: improvement in oxygen need, completion of remdesivir  Consultants:   none  Procedures:   none  Antimicrobials:  Anti-infectives (From admission, onward)   Start     Dose/Rate Route Frequency Ordered Stop   02/12/19 1000  remdesivir 100 mg in sodium chloride 0.9 %  100 mL IVPB     100 mg 200 mL/hr over 30 Minutes Intravenous Daily 02/11/19 1401 02/16/19 0959   02/11/19 1430  remdesivir 200 mg in sodium chloride 0.9% 250 mL IVPB     200 mg 580 mL/hr over 30 Minutes Intravenous Once 02/11/19 1401 02/11/19 1548     Subjective: No new complaints Willing to stay until tomorrow to complete remdesivir  Objective: Vitals:   02/13/19 1900 02/13/19 2114 02/14/19 0439 02/14/19 0823  BP: 131/82 (!) 134/94 136/74 120/71  Pulse: 87 88 78 91   Resp: _0 Temp: 97.6 F (36.4 C) 97.8 F (36.6 C) 98 F (36.7 C) 98.3 F (36.8 C)  TempSrc: Oral Oral Oral Oral  SpO2: 96% 90% 90% 91%  Weight:      Height:        Intake/Output Summary (Last 24 hours) at 02/14/2019 1530 Last data filed at 02/13/2019 1700 Gross per 24 hour  Intake 460 ml  Output -  Net 460 ml   Filed Weights   02/11/19 1412 02/11/19 2016  Weight: 102.1 kg 102.1 kg    Examination:  General: No acute distress. Cardiovascular: Heart sounds show a regular rate, and rhythm.  Lungs: Clear to auscultation bilaterally Abdomen: Soft, nontender, nondistended Neurological: Alert and oriented 3. Moves all extremities 4. Cranial nerves II through XII grossly intact. Skin: Warm and dry. No rashes or lesions. Extremities: No clubbing or cyanosis. No edema.   Data Reviewed: I have personally reviewed following labs and imaging studies  CBC: Recent Labs  Lab 02/11/19 1220 02/12/19 0930 02/13/19 0535 02/14/19 0517  WBC 8.0 7.7 10.0 8.5  NEUTROABS 6.2  --  8.4* 7.1  HGB 13.8 13.5 12.8 12.8  HCT 43.2 43.1 40.2 39.9  MCV 90.8 90.7 89.5 89.3  PLT 259 302 351 220*   Basic Metabolic Panel: Recent Labs  Lab 02/11/19 1220 02/12/19 0930 02/13/19 0535 02/14/19 0517  NA 136 137 137 138  K 4.4 4.3 4.4 4.5  CL 101 100 104 105  CO2 _1 GLUCOSE 94 137* 145* 154*  BUN 34* 32* 30* 28*  CREATININE 1.50* 1.04* 0.92 0.95  CALCIUM 7.5* 8.8* 8.6* 8.6*  MG  --   --  2.0 1.9  PHOS  --   --  2.6 3.2   GFR: Estimated Creatinine Clearance: 68 mL/min (by C-G formula based on SCr of 0.95 mg/dL). Liver Function Tests: Recent Labs  Lab 02/11/19 1220 02/12/19 0930 02/13/19 0535 02/14/19 0517  AST _2 ALT _3 ALKPHOS 43 48 41 40  BILITOT 0.7 0.7 0.5 0.5  PROT 6.8 7.5 6.7 6.5  ALBUMIN 3.1* 3.3* 3.0* 2.9*   No results for input(s): LIPASE, AMYLASE in the last 168 hours. No results for input(s): AMMONIA in the last 168 hours.  Coagulation Profile: No results for input(s): INR, PROTIME in the last 168 hours. Cardiac Enzymes: No results for input(s): CKTOTAL, CKMB, CKMBINDEX, TROPONINI in the last 168 hours. BNP (last 3 results) No results for input(s): PROBNP in the last 8760 hours. HbA1C: No results for input(s): HGBA1C in the last 72 hours. CBG: Recent Labs  Lab 02/13/19 1140 02/13/19 1635 02/13/19 1917 02/14/19 0821 02/14/19 1227  GLUCAP 198* 151* 149* 118* 134*   Lipid Profile: No results for input(s): CHOL, HDL, LDLCALC, TRIG, CHOLHDL, LDLDIRECT in the last 72 hours. Thyroid Function Tests: No results for input(s): TSH, T4TOTAL, FREET4, T3FREE, THYROIDAB in the  last 72 hours. Anemia Panel: Recent Labs    02/13/19 0535 02/14/19 0517  FERRITIN 333* 256   Sepsis Labs: Recent Labs  Lab 02/11/19 1220  PROCALCITON 0.31  LATICACIDVEN 1.2    Recent Results (from the past 240 hour(s))  Novel Coronavirus, NAA ([facility])     Status: Abnormal   Collection Time: 02/06/19 10:50 AM   Specimen: Nasopharyngeal(NP) swabs in vial transport medium   NASOPHARYNGE  TESTING  Result Value Ref Range Status   SARS-CoV-2, NAA Detected (A) Not Detected Final    Comment: This nucleic acid amplification test was developed and its performance characteristics determined by [employer]. Nucleic acid amplification tests include RT-PCR and TMA. This test has not been FDA cleared or approved. This test has been authorized by FDA under an Emergency Use Authorization (EUA). This test is only authorized for the duration of time the declaration that circumstances exist justifying the authorization of the emergency use of in vitro diagnostic tests for detection of SARS-CoV-2 virus and/or diagnosis of COVID-19 infection under section 564(b)(1) of the Act, 21 U.S.C. 335KTG-2(B) (1), unless the authorization is terminated or revoked sooner. When diagnostic testing is negative, the possibility of a false negative  result should be considered in the context of a patient's recent exposures and the presence of clinical signs and symptoms consistent with COVID-19. An individual without symptoms of COVID-19 and who is not shedding SARS-CoV-2 virus wo uld expect to have a negative (not detected) result in this assay.   Blood Culture (routine x 2)     Status: None (Preliminary result)   Collection Time: 02/11/19 12:20 PM   Specimen: BLOOD  Result Value Ref Range Status   Specimen Description   Final    BLOOD RIGHT ANTECUBITAL Performed at [facility] [address], [place], [place] [address]    Special Requests   Final    BOTTLES DRAWN AEROBIC AND ANAEROBIC Blood Culture adequate volume   Culture  Setup Time   Final    IN BOTH AEROBIC AND ANAEROBIC BOTTLES GRAM POSITIVE RODS CRITICAL RESULT CALLED TO, READ BACK BY AND VERIFIED WITH: L CHEN [facility] 02/13/19 1847 JDW Performed at [facility], [place] [address] [place], [place] [address]    Culture CULTURE REINCUBATED FOR BETTER GROWTH  Final   Report Status PENDING  Incomplete  Culture, blood (routine x 2)     Status: None (Preliminary result)   Collection Time: 02/13/19  9:55 PM   Specimen: BLOOD  Result Value Ref Range Status   Specimen Description BLOOD BLOOD LEFT HAND  Final   Special Requests   Final    BOTTLES DRAWN AEROBIC AND ANAEROBIC Blood Culture adequate volume Performed at [facility] [address], [place], [place] [address]    Culture NO GROWTH < 12 HOURS  Final   Report Status PENDING  Incomplete  Culture, blood (routine x 2)     Status: None (Preliminary result)   Collection Time: 02/13/19 10:00 PM   Specimen: BLOOD  Result Value Ref Range Status   Specimen Description BLOOD BLOOD RIGHT HAND  Final   Special Requests   Final    BOTTLES DRAWN AEROBIC AND ANAEROBIC Blood Culture adequate volume Performed at [facility] [address], [place], [place] [address]     Culture NO GROWTH < 12 HOURS  Final   Report Status PENDING  Incomplete         Radiology Studies: No results found.  Scheduled Meds: . vitamin C  500 mg Oral Daily  . aspirin  81 mg Oral Daily  . benztropine  1 mg Oral BID  . carvedilol  3.125 mg Oral BID WC  . enoxaparin (LOVENOX) injection  50 mg Subcutaneous Q24H  . insulin aspart  0-5 Units Subcutaneous QHS  . insulin aspart  0-9 Units Subcutaneous TID WC  . insulin detemir  5 Units Subcutaneous BID  . methylPREDNISolone (SOLU-MEDROL) injection  50 mg Intravenous BID  . pantoprazole  40 mg Oral Daily  . rosuvastatin  20 mg Oral q1800  . traZODone  100 mg Oral QHS  . venlafaxine  37.5 mg Oral BID  . zinc sulfate  220 mg Oral Daily   Continuous Infusions: . remdesivir 100 mg in NS 100 mL 100 mg (02/14/19 0939)     LOS: 3 days    Time spent: over 30 min    Fayrene Helper, MD [facility]   To contact the attending provider between 7A-7P or the covering provider during after hours 7P-7A, please log into the web site [email or web address] and access using universal [place] password for that web site. If you do not have the password, please call the hospital operator.  02/14/2019, 3:30 PM

## 2019-02-14 NOTE — Plan of Care (Signed)
Problem: Education: Goal: Knowledge of General Education information will improve Description: Including pain rating scale, medication(s)/side effects and non-pharmacologic comfort measures 02/14/2019 1856 by Tomie China, RN Outcome: Progressing 02/14/2019 1620 by Tomie China, RN Outcome: Progressing   Problem: Health Behavior/Discharge Planning: Goal: Ability to manage health-related needs will improve 02/14/2019 1856 by Tomie China, RN Outcome: Progressing 02/14/2019 1620 by Tomie China, RN Outcome: Progressing   Problem: Clinical Measurements: Goal: Ability to maintain clinical measurements within normal limits will improve 02/14/2019 1856 by Tomie China, RN Outcome: Progressing 02/14/2019 1620 by Tomie China, RN Outcome: Progressing Goal: Will remain free from infection 02/14/2019 1856 by Tomie China, RN Outcome: Progressing 02/14/2019 1620 by Tomie China, RN Outcome: Progressing Goal: Diagnostic test results will improve 02/14/2019 1856 by Tomie China, RN Outcome: Progressing 02/14/2019 1620 by Tomie China, RN Outcome: Progressing Goal: Respiratory complications will improve 02/14/2019 1856 by Tomie China, RN Outcome: Progressing 02/14/2019 1620 by Tomie China, RN Outcome: Progressing Goal: Cardiovascular complication will be avoided 02/14/2019 1856 by Tomie China, RN Outcome: Progressing 02/14/2019 1620 by Tomie China, RN Outcome: Progressing   Problem: Activity: Goal: Risk for activity intolerance will decrease 02/14/2019 1856 by Tomie China, RN Outcome: Progressing 02/14/2019 1620 by Tomie China, RN Outcome: Progressing   Problem: Nutrition: Goal: Adequate nutrition will be maintained 02/14/2019 1856 by Tomie China, RN Outcome: Progressing 02/14/2019 1620 by Tomie China, RN Outcome: Progressing   Problem: Coping: Goal: Level  of anxiety will decrease 02/14/2019 1856 by Tomie China, RN Outcome: Progressing 02/14/2019 1620 by Tomie China, RN Outcome: Progressing   Problem: Elimination: Goal: Will not experience complications related to bowel motility 02/14/2019 1856 by Tomie China, RN Outcome: Progressing 02/14/2019 1620 by Tomie China, RN Outcome: Progressing Goal: Will not experience complications related to urinary retention 02/14/2019 1856 by Tomie China, RN Outcome: Progressing 02/14/2019 1620 by Tomie China, RN Outcome: Progressing   Problem: Pain Managment: Goal: General experience of comfort will improve 02/14/2019 1856 by Tomie China, RN Outcome: Progressing 02/14/2019 1620 by Tomie China, RN Outcome: Progressing   Problem: Safety: Goal: Ability to remain free from injury will improve 02/14/2019 1856 by Tomie China, RN Outcome: Progressing 02/14/2019 1620 by Tomie China, RN Outcome: Progressing   Problem: Skin Integrity: Goal: Risk for impaired skin integrity will decrease 02/14/2019 1856 by Tomie China, RN Outcome: Progressing 02/14/2019 1620 by Tomie China, RN Outcome: Progressing   Problem: Education: Goal: Knowledge of risk factors and measures for prevention of condition will improve 02/14/2019 1856 by Tomie China, RN Outcome: Progressing 02/14/2019 1620 by Tomie China, RN Outcome: Progressing   Problem: Coping: Goal: Psychosocial and spiritual needs will be supported 02/14/2019 1856 by Tomie China, RN Outcome: Progressing 02/14/2019 1620 by Tomie China, RN Outcome: Progressing   Problem: Respiratory: Goal: Will maintain a patent airway 02/14/2019 1856 by Tomie China, RN Outcome: Progressing 02/14/2019 1620 by Tomie China, RN Outcome: Progressing Goal: Complications related to the disease process, condition or treatment will be avoided or  minimized 02/14/2019 1856 by Tomie China, RN Outcome: Progressing 02/14/2019 1620 by Tomie China, RN Outcome: Progressing   Problem: Fluid Volume: Goal: Hemodynamic stability will improve 02/14/2019 1856 by Tomie China, RN Outcome: Progressing 02/14/2019 1620 by Tomie China, RN Outcome: Progressing   Problem: Clinical Measurements: Goal: Diagnostic test results  will improve 02/14/2019 1856 by Tomie China, RN Outcome: Progressing 02/14/2019 1620 by Tomie China, RN Outcome: Progressing Goal: Signs and symptoms of infection will decrease 02/14/2019 1856 by Tomie China, RN Outcome: Progressing 02/14/2019 1620 by Tomie China, RN Outcome: Progressing   Problem: Respiratory: Goal: Ability to maintain adequate ventilation will improve 02/14/2019 1856 by Tomie China, RN Outcome: Progressing 02/14/2019 1620 by Tomie China, RN Outcome: Progressing

## 2019-02-14 NOTE — Plan of Care (Signed)
Pt A&Ox4. VSS, SpO2 > 88% on 2L [place]. No c/o pain throughout shift. Ambulating to bathroom w/ assistance, tolerating transition well  OOBTC for majority of shift.   All safety precautions in place, will report to oncoming shift  Kristine Atkinson

## 2019-02-15 DIAGNOSIS — I10 Essential (primary) hypertension: Secondary | ICD-10-CM

## 2019-02-15 DIAGNOSIS — E785 Hyperlipidemia, unspecified: Secondary | ICD-10-CM

## 2019-02-15 DIAGNOSIS — F329 Major depressive disorder, single episode, unspecified: Secondary | ICD-10-CM

## 2019-02-15 DIAGNOSIS — F69 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: Secondary | ICD-10-CM

## 2019-02-15 DIAGNOSIS — E1142 Type 2 diabetes mellitus with diabetic polyneuropathy: Secondary | ICD-10-CM

## 2019-02-15 DIAGNOSIS — F419 Anxiety disorder, unspecified: Secondary | ICD-10-CM

## 2019-02-15 DIAGNOSIS — N179 Acute kidney failure, unspecified: Secondary | ICD-10-CM

## 2019-02-15 LAB — COMPREHENSIVE METABOLIC PANEL
ALT: 17 U/L (ref 0–44)
AST: 17 U/L (ref 15–41)
Albumin: 3.1 g/dL — ABNORMAL LOW (ref 3.5–5.0)
Alkaline Phosphatase: 42 U/L (ref 38–126)
Anion gap: 11 (ref 5–15)
BUN: 28 mg/dL — ABNORMAL HIGH (ref 8–23)
CO2: 23 mmol/L (ref 22–32)
Calcium: 8.7 mg/dL — ABNORMAL LOW (ref 8.9–10.3)
Chloride: 105 mmol/L (ref 98–111)
Creatinine, Ser: 0.95 mg/dL (ref 0.44–1.00)
GFR calc Af Amer: >60 mL/min (ref >60)
GFR calc non Af Amer: >60 mL/min (ref >60)
Glucose, Bld: 169 mg/dL — ABNORMAL HIGH (ref 70–99)
Potassium: 4.5 mmol/L (ref 3.5–5.1)
Sodium: 139 mmol/L (ref 135–145)
Total Bilirubin: 0.7 mg/dL (ref 0.3–1.2)
Total Protein: 6.4 g/dL — ABNORMAL LOW (ref 6.5–8.1)

## 2019-02-15 LAB — CBC WITH DIFFERENTIAL/PLATELET
Abs Immature Granulocytes: 0.1 10*3/uL — ABNORMAL HIGH (ref 0.00–0.07)
Basophils Absolute: 0 10*3/uL (ref 0.0–0.1)
Basophils Relative: 0 %
Eosinophils Absolute: 0 10*3/uL (ref 0.0–0.5)
Eosinophils Relative: 0 %
HCT: 40.1 % (ref 36.0–46.0)
Hemoglobin: 13 g/dL (ref 12.0–15.0)
Immature Granulocytes: 1 %
Lymphocytes Relative: 14 %
Lymphs Abs: 1.2 10*3/uL (ref 0.7–4.0)
MCH: 28.8 pg (ref 26.0–34.0)
MCHC: 32.4 g/dL (ref 30.0–36.0)
MCV: 88.9 fL (ref 80.0–100.0)
Monocytes Absolute: 0.3 10*3/uL (ref 0.1–1.0)
Monocytes Relative: 3 %
Neutro Abs: 7.1 10*3/uL (ref 1.7–7.7)
Neutrophils Relative %: 82 %
Platelets: 456 10*3/uL — ABNORMAL HIGH (ref 150–400)
RBC: 4.51 MIL/uL (ref 3.87–5.11)
RDW: 16.6 % — ABNORMAL HIGH (ref 11.5–15.5)
WBC: 8.6 10*3/uL (ref 4.0–10.5)
nRBC: 0 % (ref 0.0–0.2)

## 2019-02-15 LAB — C-REACTIVE PROTEIN: CRP: 3.9 mg/dL — ABNORMAL HIGH (ref <1.0)

## 2019-02-15 LAB — FERRITIN: Ferritin: 170 ng/mL (ref 11–307)

## 2019-02-15 LAB — D-DIMER, QUANTITATIVE: D-Dimer, Quant: 0.28 ug/mL-FEU (ref 0.00–0.50)

## 2019-02-15 LAB — GLUCOSE, CAPILLARY
Glucose-Capillary: 143 mg/dL — ABNORMAL HIGH (ref 70–99)
Glucose-Capillary: 184 mg/dL — ABNORMAL HIGH (ref 70–99)

## 2019-02-15 MED ORDER — DEXAMETHASONE 6 MG PO TABS: 6.0000 mg | ORAL_TABLET | Freq: Every day | ORAL | 0 refills | Status: AC

## 2019-02-15 MED FILL — DEXAMETHASONE 6 MG TABLET: 6 | 5 days supply | Qty: 5 | Fill #0

## 2019-02-15 NOTE — Plan of Care (Signed)
A&Ox4 w/ delayed responses & forgetfulness. VSS, SpO2 > 88% on 2L [place]. No c/o pain throughout shift.  Ambulating to bathroom w. Assistance. Bgs stable w/ sliding scale insulin for coverage.  D/c paperwork reviewed w. Pt, stated understanding & all questions answered. PIV removed & pt left w all belongings  Home O2 delivered to pt house & separate tank brought to room. Pt left unit w. All belongings  Kristine Atkinson    Problem: Education: Goal: Knowledge of General Education information will improve Description: Including pain rating scale, medication(s)/side effects and non-pharmacologic comfort measures Outcome: Adequate for Discharge   Problem: Health Behavior/Discharge Planning: Goal: Ability to manage health-related needs will improve Outcome: Adequate for Discharge   Problem: Clinical Measurements: Goal: Ability to maintain clinical measurements within normal limits will improve Outcome: Adequate for Discharge Goal: Will remain free from infection Outcome: Adequate for Discharge Goal: Diagnostic test results will improve Outcome: Adequate for Discharge Goal: Respiratory complications will improve Outcome: Adequate for Discharge Goal: Cardiovascular complication will be avoided Outcome: Adequate for Discharge   Problem: Activity: Goal: Risk for activity intolerance will decrease Outcome: Adequate for Discharge   Problem: Nutrition: Goal: Adequate nutrition will be maintained Outcome: Adequate for Discharge   Problem: Coping: Goal: Level of anxiety will decrease Outcome: Adequate for Discharge   Problem: Elimination: Goal: Will not experience complications related to bowel motility Outcome: Adequate for Discharge Goal: Will not experience complications related to urinary retention Outcome: Adequate for Discharge   Problem: Pain Managment: Goal: General experience of comfort will improve Outcome: Adequate for Discharge   Problem: Safety: Goal: Ability to  remain free from injury will improve Outcome: Adequate for Discharge   Problem: Skin Integrity: Goal: Risk for impaired skin integrity will decrease Outcome: Adequate for Discharge   Problem: Education: Goal: Knowledge of risk factors and measures for prevention of condition will improve Outcome: Adequate for Discharge   Problem: Coping: Goal: Psychosocial and spiritual needs will be supported Outcome: Adequate for Discharge   Problem: Respiratory: Goal: Will maintain a patent airway Outcome: Adequate for Discharge Goal: Complications related to the disease process, condition or treatment will be avoided or minimized Outcome: Adequate for Discharge   Problem: Fluid Volume: Goal: Hemodynamic stability will improve Outcome: Adequate for Discharge   Problem: Clinical Measurements: Goal: Diagnostic test results will improve Outcome: Adequate for Discharge Goal: Signs and symptoms of infection will decrease Outcome: Adequate for Discharge   Problem: Respiratory: Goal: Ability to maintain adequate ventilation will improve Outcome: Adequate for Discharge

## 2019-02-15 NOTE — TOC Transition Note (Signed)
Transition of Care [facility]) - CM/SW Discharge Note   Patient Details  Name: Kristine Atkinson MRN: [id number] Date of Birth: 07/06/1955  Transition of [facility]) CM/SW Contact:  Amador Cunas, [place] Phone Number: 02/15/2019, 2:56 PM   Clinical Narrative:   Dc home with charity oxygen arranged through [place]. Pt's husband confirmed oxygen delivered and he is able to afford prescriptions at outpt pharmacy. Pt's husband to provide transport.     Final next level of care: Home/Self Care Barriers to Discharge: No Barriers Identified   Patient Goals and CMS Choice        Discharge Placement                       Discharge Plan and Services                DME Arranged: Oxygen DME Agency: [employer] Date DME Agency Contacted: 02/15/19 Time DME Agency Contacted: 1200 Representative spoke with at DME Agency: [place] ([place]) Interventions     Readmission Risk Interventions No flowsheet data found.

## 2019-02-15 NOTE — Progress Notes (Signed)
SATURATION QUALIFICATIONS: (This note is used to comply with regulatory documentation for home oxygen)  Patient Saturations on Room Air at Rest = 91%  Patient Saturations on Room Air while Ambulating = 85%  Patient Saturations on 3 Liters of oxygen while Ambulating = 90%  Please briefly explain why patient needs home oxygen: Pt requires supplemental oxygen to maintain SpO2 at 88% and above while ambulating.

## 2019-02-15 NOTE — Progress Notes (Signed)
Ambulation Note  Saturation Pre: 91% on RA  Ambulation Distance: 230 ft  Saturation During Ambulation: 90-91% on 3L  Notes: Pt walked independently with occasional standby assist during turns and while rotating. Pt desated upon standing and was put on 2L. SpO2 dropped to 85% on 2L about 100 ft into the walk, increased to 3L. Pt returned to recliner with call bell within reach and chair alarm on, SpO2 96% on 2L.   Philis Kendall, MS, ACSM CEP 9:48 AM 02/15/2019

## 2019-02-15 NOTE — Discharge Summary (Signed)
Physician Discharge Summary  Kristine Atkinson [id number] DOB: 02/21/1955 DOA: 02/11/2019  PCP: Mack Hook, MD  Admit date: 02/11/2019 Discharge date: 02/15/2019  Admitted From: Home Disposition: Home   Recommendations for Outpatient Follow-up:  1. Follow up with PCP in 1-2 weeks 2. Please obtain CMP/CBC in one week 3. Follow up final results of repeat blood cultures from 2/1.   Home Health: PT, OT, RN Equipment/Devices: 3L O2 Discharge Condition: Stable CODE STATUS: Full Diet recommendation: Heart healthy, carb-modified  Brief/Interim Summary: Kristine Atkinson a 64 y.o.femalewith medical history significant ofhypertension, diabetes, chronic pain syndrome, cocaine abuse, fibromyalgia, hyperlipidemia, depression/anxiety who was diagnosed with covid-19 on 1/25 and presented for bamlanivimab infusion on 1/30 where she was found to be hypoxemic and admitted. Remdesivir has been given with improvement in respiratory status, decreasing inflammatory markers, and maintaining clinical stability. She received actemra as well.  She is stable for discharge to complete 5 more days of steroids at home.  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 - Continue airborne, contact isolation for 21 days from positive testing.  CoNS bacteremia: from 1 of 1 culture collections on 1/30, repeat culture 2/1 is NGTD at discharge. Suspected to be contaminant with no fever or leukocytosis.  - Continue  monitoring follow up cultures.   AKI: Found to be dehydrated on presentation, improved back to normal CrCl.  - Avoid nephrotoxins. Needs continued counseling regarding the concomitant use of mobic and ibuprofen (both on PTA medication list)  T2DM: Well-controlled  - Continue home medications. A1c 6.4% indicating good control  HTN:  - Continue home medications  Anxiety and depression:As per the report, she is dependent upon her husband for her care due to her mental health. - Continue home medications  Hyperlipidemia:  - Continue statin  Morbid obesity: BMI 41.84. Noted.    Discharge Instructions Discharge Instructions    Diet - low sodium heart healthy   Complete by: As directed    Discharge instructions   Complete by: As directed    You are being discharged from the hospital after treatment for covid-19 infection. You are felt to be stable enough to no longer require inpatient monitoring, testing, and treatment, though you will need to follow the recommendations below: - Continue taking decadron (a steroid) for 5 more days - Per CDC guidelines, you will need to remain in isolation for 21 days from your first positive covid test. - Your kidney function was impaired when you came to the hospital and has since improved. To avoid further worsening kidney function, do NOT take mobic along with any other NSAID (including, but not limited to, ibuprofen, advil, motrin, naproxen, aleve, goody's powder, etc.) - Follow up with your doctor in the next week via telehealth or seek medical attention right away if your symptoms get WORSE.  - Consider donating plasma after you have recovered (either 14 days after a negative test or 28 days after symptoms have completely resolved) because your antibodies to this virus may be helpful to give  to others with life-threatening infections. Please go to the website www.[email or web address] if you would like to consider volunteering for plasma donation.     Directions for you at home:  Wear a facemask You should wear a facemask that covers your nose and mouth when you are in the same room with other people and when you visit a healthcare provider. People who live with or visit you should also wear a facemask while they are in the same room with you.  Separate yourself from other people in your home As much as possible, you should stay in a different room from other people in your home. Also, you should use a separate bathroom, if available.  Avoid sharing household items You should not share dishes, drinking glasses, cups, eating utensils, towels, bedding, or other items with other people in your home. After using these items, you should wash them thoroughly with soap and water.  Cover your coughs and sneezes Cover your mouth and nose with a tissue when you cough or sneeze, or you can cough or sneeze into your sleeve. Throw used tissues in a lined trash can, and immediately wash your hands with soap and water for at least 20 seconds or use an alcohol-based hand rub.  Wash your [facility] your hands often and thoroughly with soap and water for at least 20 seconds. You can use an alcohol-based hand sanitizer if soap and water are not available and if your hands are not visibly dirty. Avoid touching your eyes, nose, and mouth with unwashed hands.  Directions for those who live with, or provide care at home for you:  Limit the number of people who have contact with the patient If possible, have only one caregiver for the patient. Other household members should stay in another home or place of residence. If this is not possible, they should stay in another room, or be separated from the patient as much as possible. Use a separate bathroom, if available. Restrict visitors who do not have an essential need to be in the home.  Ensure good ventilation Make sure that shared spaces in the home have good air flow, such as from an air  conditioner or an opened window, weather permitting.  Wash your hands often Wash your hands often and thoroughly with soap and water for at least 20 seconds. You can use an alcohol based hand sanitizer if soap and water are not available and if your hands are not visibly dirty. Avoid touching your eyes, nose, and mouth with unwashed hands. Use disposable paper towels to dry your hands. If not available, use dedicated cloth towels and replace them when they become wet.  Wear a facemask and gloves Wear a disposable facemask at all times in the room and gloves when you touch or have contact with the patient's blood, body fluids, and/or secretions or excretions, such as sweat, saliva, sputum, nasal mucus, vomit, urine, or feces.  Ensure the mask fits over your nose and mouth tightly, and do not touch it during use. Throw out disposable facemasks and gloves after using them. Do not reuse. Wash your hands immediately after removing your facemask and gloves. If your personal clothing becomes contaminated, carefully remove clothing and launder. Wash your hands after handling contaminated clothing. Place all used disposable facemasks, gloves, and other waste in a lined container before disposing them with other household waste. Remove gloves and wash your hands immediately after handling these items.  Do not share dishes, glasses, or other household items  with the patient Avoid sharing household items. You should not share dishes, drinking glasses, cups, eating utensils, towels, bedding, or other items with a patient who is confirmed to have, or being evaluated for, COVID-19 infection. After the person uses these items, you should wash them thoroughly with soap and water.  Wash laundry thoroughly Immediately remove and wash clothes or bedding that have blood, body fluids, and/or secretions or excretions, such as sweat, saliva, sputum, nasal mucus, vomit, urine, or feces, on them. Wear gloves when  handling laundry from the patient. Read and follow directions on labels of laundry or clothing items and detergent. In general, wash and dry with the warmest temperatures recommended on the label.  Clean all areas the individual has used often Clean all touchable surfaces, such as counters, tabletops, doorknobs, bathroom fixtures, toilets, phones, keyboards, tablets, and bedside tables, every day. Also, clean any surfaces that may have blood, body fluids, and/or secretions or excretions on them. Wear gloves when cleaning surfaces the patient has come in contact with. Use a diluted bleach solution (e.g., dilute bleach with 1 part bleach and 10 parts water) or a household disinfectant with a label that says EPA-registered for coronaviruses. To make a bleach solution at home, add 1 tablespoon of bleach to 1 quart (4 cups) of water. For a larger supply, add  cup of bleach to 1 gallon (16 cups) of water. Read labels of cleaning products and follow recommendations provided on product labels. Labels contain instructions for safe and effective use of the cleaning product including precautions you should take when applying the product, such as wearing gloves or eye protection and making sure you have good ventilation during use of the product. Remove gloves and wash hands immediately after cleaning.  Monitor yourself for signs and symptoms of illness Caregivers and household members are considered close contacts, should monitor their health, and will be asked to limit movement outside of the home to the extent possible. Follow the monitoring steps for close contacts listed on the symptom monitoring form.  If you have additional questions, contact your local health department or call the epidemiologist on call at [phone] (available 24/7). This guidance is subject to change. For the most up-to-date guidance from CDC, please refer to their  website: YouBlogs.pl   Increase activity slowly   Complete by: As directed      Allergies as of 02/15/2019      Reactions   Penicillins Itching   Did it involve swelling of the face/tongue/throat, SOB, or low BP? Unknown Did it involve sudden or severe rash/hives, skin peeling, or any reaction on the inside of your mouth or nose? Unknown Did you need to seek medical attention at a hospital or doctor's office? Unknown When did it last happen?Unknown If all above answers are "NO", may proceed with cephalosporin use.      Medication List    STOP taking these medications   ibuprofen 200 MG tablet Commonly known as: ADVIL   triamcinolone cream 0.1 % Commonly known as: KENALOG     TAKE these medications   AgaMatrix Presto Test test strip Generic drug: glucose blood Check blood glucose twice daily before meals   AgaMatrix Ultra-Thin Lancets Misc Check blood glucose twice daily before meals   Aristada 441 MG/1.6ML Prsy Generic drug: ARIPiprazole Lauroxil ER Inject 1 Dose into the muscle every 30 (thirty) days.   aspirin 81 MG tablet Take 1 tablet (81 mg total) by mouth daily.   benztropine 1 MG tablet Commonly known  as: COGENTIN Take 1 tablet (1 mg total) by mouth 2 (two) times daily.   carvedilol 3.125 MG tablet Commonly known as: COREG Take 1 tablet (3.125 mg total) by mouth 2 (two) times daily.   cyclobenzaprine 10 MG tablet Commonly known as: FLEXERIL TAKE 1 TABLET BY MOUTH THREE TIMES A DAY AS NEEDED FOR MUSCLE SPASM What changed: See the new instructions.   dexamethasone 6 MG tablet Commonly known as: Decadron Take 1 tablet (6 mg total) by mouth daily for 5 days. Start taking on: February 16, 2019   hydrochlorothiazide 25 MG tablet Commonly known as: HYDRODIURIL 1 cap in the morning once daily. What changed:   how much to take  how to take this  when to take this  additional instructions    losartan 50 MG tablet Commonly known as: COZAAR Take 1 tablet (50 mg total) by mouth daily.   meloxicam 15 MG tablet Commonly known as: MOBIC TAKE 1 TABLET BY MOUTH DAILY WITH FOOD FOR LEG PAIN What changed: See the new instructions.   metFORMIN 500 MG 24 hr tablet Commonly known as: GLUCOPHAGE-XR TAKE 1 TABLET BY MOUTH TWICE A DAY WITH MEALS What changed:   how much to take  how to take this  when to take this  additional instructions   omeprazole 20 MG capsule Commonly known as: PRILOSEC TAKE 1 CAPSULE BY MOUTH TWICE A DAY   pregabalin 50 MG capsule Commonly known as: Lyrica Take 1 capsule (50 mg total) by mouth 3 (three) times daily.   rosuvastatin 20 MG tablet Commonly known as: Crestor 1 tab by mouth daily with evening meal What changed:   how much to take  how to take this  when to take this  additional instructions   traZODone 100 MG tablet Commonly known as: DESYREL Take 200 mg by mouth at bedtime.   venlafaxine XR 37.5 MG 24 hr capsule Commonly known as: EFFEXOR-XR Take 37.5 mg by mouth daily with breakfast.            Durable Medical Equipment  (From admission, onward)         Start     Ordered   02/15/19 0658  For home use only DME oxygen  Once    Question Answer Comment  Length of Need 6 Months   Mode or (Route) Nasal cannula   Liters per Minute 2   Frequency Continuous (stationary and portable oxygen unit needed)   Oxygen delivery system Gas      02/15/19 0658         Follow-up Information    Mack Hook, MD. Schedule an appointment as soon as possible for a visit.   Specialty: Internal Medicine Contact information: [address] [place] [address] [phone]          Allergies  Allergen Reactions  . Penicillins Itching    Did it involve swelling of the face/tongue/throat, SOB, or low BP? Unknown Did it involve sudden or severe rash/hives, skin peeling, or any reaction on the inside of your mouth  or nose? Unknown Did you need to seek medical attention at a hospital or doctor's office? Unknown When did it last happen?Unknown If all above answers are "NO", may proceed with cephalosporin use.    Consultations:  None  Procedures/Studies: DG Chest Port 1 View  Result Date: 02/11/2019 CLINICAL DATA:  COVID-19 positive.  Shortness of breath. EXAM: PORTABLE CHEST 1 VIEW COMPARISON:  June 15, 2012 FINDINGS: The cardiomediastinal silhouette is stable. No  pneumothorax. The left lung is clear. There is infiltrate in the right base. No other interval changes or acute abnormalities. IMPRESSION: Developing infiltrate in the right base consistent with the patient's history of COVID-19. No other abnormalities. Electronically Signed   By: Dorise Bullion III M.D   On: 02/11/2019 12:53    Subjective: Feels well, has clothes on to leave. No dyspnea even when walking earlier today when she required supplemental oxygen to maintain SpO2 >90%. She needs no oxygen at rest, denies any pain. Does not think she needs oxygen.   Discharge Exam: Vitals:   02/15/19 0410 02/15/19 0742  BP: (!) 138/91 (!) 158/96  Pulse: 72 82  Resp: 20 19  Temp: 98.2 F (36.8 C) 97.9 F (36.6 C)  SpO2: 90% 90%   General: Pt is alert, awake, not in acute distress Cardiovascular: RRR, S1/S2 +, no rubs, no gallops Respiratory: CTA bilaterally, no wheezing, no rhonchi Abdominal: Soft, NT, ND, bowel sounds + Extremities: No edema, no cyanosis  Labs: BNP (last 3 results) No results for input(s): BNP in the last 8760 hours. Basic Metabolic Panel: Recent Labs  Lab 02/11/19 1220 02/12/19 0930 02/13/19 0535 02/14/19 0517 02/15/19 0446  NA 136 137 137 138 139  K 4.4 4.3 4.4 4.5 4.5  CL 101 100 104 105 105  CO2 26 26 24 25 23   GLUCOSE 94 137* 145* 154* 169*  BUN 34* 32* 30* 28* 28*  CREATININE 1.50* 1.04* 0.92 0.95 0.95  CALCIUM 7.5* 8.8* 8.6* 8.6* 8.7*  MG  --   --  2.0 1.9  --   PHOS  --   --  2.6 3.2  --     Liver Function Tests: Recent Labs  Lab 02/11/19 1220 02/12/19 0930 02/13/19 0535 02/14/19 0517 02/15/19 0446  AST 25 27 23 20 17   ALT 14 17 15 15 17   ALKPHOS 43 48 41 40 42  BILITOT 0.7 0.7 0.5 0.5 0.7  PROT 6.8 7.5 6.7 6.5 6.4*  ALBUMIN 3.1* 3.3* 3.0* 2.9* 3.1*   No results for input(s): LIPASE, AMYLASE in the last 168 hours. No results for input(s): AMMONIA in the last 168 hours. CBC: Recent Labs  Lab 02/11/19 1220 02/12/19 0930 02/13/19 0535 02/14/19 0517 02/15/19 0446  WBC 8.0 7.7 10.0 8.5 8.6  NEUTROABS 6.2  --  8.4* 7.1 7.1  HGB 13.8 13.5 12.8 12.8 13.0  HCT 43.2 43.1 40.2 39.9 40.1  MCV 90.8 90.7 89.5 89.3 88.9  PLT 259 302 351 409* 456*   Cardiac Enzymes: No results for input(s): CKTOTAL, CKMB, CKMBINDEX, TROPONINI in the last 168 hours. BNP: Invalid input(s): POCBNP CBG: Recent Labs  Lab 02/14/19 0821 02/14/19 1227 02/14/19 1548 02/14/19 1933 02/15/19 0741  GLUCAP 118* 134* 171* 193* 143*   D-Dimer Recent Labs    02/14/19 0517 02/15/19 0446  DDIMER 0.52* 0.28   Hgb A1c No results for input(s): HGBA1C in the last 72 hours. Lipid Profile No results for input(s): CHOL, HDL, LDLCALC, TRIG, CHOLHDL, LDLDIRECT in the last 72 hours. Thyroid function studies No results for input(s): TSH, T4TOTAL, T3FREE, THYROIDAB in the last 72 hours.  Invalid input(s): FREET3 Anemia work up Recent Labs    02/14/19 0517 02/15/19 0446  FERRITIN 256 170   Urinalysis    Component Value Date/Time   COLORURINE YELLOW 06/04/2010 [place] 06/04/2010 1334   LABSPEC 1.024 06/04/2010 1334   PHURINE 6.5 06/04/2010 1334   GLUCOSEU NEGATIVE 06/04/2010 1334   [place] 06/04/2010  [place] 08/14/2013 1116   [place] 06/04/2010 1334   PROTEINUR NEG 08/14/2013 1116   PROTEINUR NEGATIVE 06/04/2010 1334   UROBILINOGEN 0.2 08/14/2013 1116   UROBILINOGEN 0.2 06/04/2010 1334   NITRITE NEG 08/14/2013 1116   NITRITE  NEGATIVE 06/04/2010 1334   LEUKOCYTESUR Negative 08/14/2013 1116    Microbiology Recent Results (from the past 240 hour(s))  Novel Coronavirus, NAA ([facility])     Status: Abnormal   Collection Time: 02/06/19 10:50 AM   Specimen: Nasopharyngeal(NP) swabs in vial transport medium   NASOPHARYNGE  TESTING  Result Value Ref Range Status   SARS-CoV-2, NAA Detected (A) Not Detected Final    Comment: This nucleic acid amplification test was developed and its performance characteristics determined by [employer]. Nucleic acid amplification tests include RT-PCR and TMA. This test has not been FDA cleared or approved. This test has been authorized by FDA under an Emergency Use Authorization (EUA). This test is only authorized for the duration of time the declaration that circumstances exist justifying the authorization of the emergency use of in vitro diagnostic tests for detection of SARS-CoV-2 virus and/or diagnosis of COVID-19 infection under section 564(b)(1) of the Act, 21 U.S.C. GF:7541899) (1), unless the authorization is terminated or revoked sooner. When diagnostic testing is negative, the possibility of a false negative result should be considered in the context of a patient's recent exposures and the presence of clinical signs and symptoms consistent with COVID-19. An individual without symptoms of COVID-19 and who is not shedding SARS-CoV-2 virus wo uld expect to have a negative (not detected) result in this assay.   Blood Culture (routine x 2)     Status: Abnormal (Preliminary result)   Collection Time: 02/11/19 12:20 PM   Specimen: BLOOD  Result Value Ref Range Status   Specimen Description   Final    BLOOD RIGHT ANTECUBITAL Performed at [facility] [address], [place], [place] [address]    Special Requests   Final    BOTTLES DRAWN AEROBIC AND ANAEROBIC Blood Culture adequate volume   Culture  Setup Time   Final    IN BOTH AEROBIC AND  ANAEROBIC BOTTLES GRAM POSITIVE RODS CRITICAL RESULT CALLED TO, READ BACK BY AND VERIFIED WITH: L CHEN [facility] 02/13/19 1847 JDW Performed at [facility], [place] [address], [place], [place] [address]    Culture (A)  Final    STAPHYLOCOCCUS SPECIES (COAGULASE NEGATIVE) THE SIGNIFICANCE OF ISOLATING THIS ORGANISM FROM A SINGLE SET OF BLOOD CULTURES WHEN MULTIPLE SETS ARE DRAWN IS UNCERTAIN. PLEASE NOTIFY THE MICROBIOLOGY DEPARTMENT WITHIN ONE WEEK IF SPECIATION AND SENSITIVITIES ARE REQUIRED. GRAM POSITIVE RODS    Report Status PENDING  Incomplete  Culture, blood (routine x 2)     Status: None (Preliminary result)   Collection Time: 02/13/19  9:55 PM   Specimen: BLOOD  Result Value Ref Range Status   Specimen Description   Final    BLOOD BLOOD LEFT HAND Performed at [facility] [address], [place], [place] [address]    Special Requests   Final    BOTTLES DRAWN AEROBIC AND ANAEROBIC Blood Culture adequate volume Performed at [facility] [address], [place], [place] [address]    Culture   Final    NO GROWTH 1 DAY Performed at [facility], [place] [address], [place], [place] [address]    Report Status PENDING  Incomplete  Culture, blood (routine x 2)  Status: None (Preliminary result)   Collection Time: 02/13/19 10:00 PM   Specimen: BLOOD  Result Value Ref Range Status   Specimen Description   Final    BLOOD BLOOD RIGHT HAND Performed at [facility] [address], [place], [place] [address]    Special Requests   Final    BOTTLES DRAWN AEROBIC AND ANAEROBIC Blood Culture adequate volume Performed at [facility] [address], [place], [place] [address]    Culture   Final    NO GROWTH 1 DAY Performed at [facility], [place] [address], [place], [place] [address]    Report Status PENDING  Incomplete    Time coordinating discharge: Approximately 40 minutes  Patrecia Pour,  MD  [facility] 02/15/2019, 11:41 AM

## 2019-02-15 NOTE — Progress Notes (Signed)
Spoke with the patient's spouse. He confirmed that the oxygen was delivered to the house, and he will be able to pick up prescriptions at [facility].

## 2019-02-15 NOTE — Progress Notes (Signed)
Reviewed AVS with the patient, all questions answered. Research officer, trade [employer] and placed in chart. 02 tank delivered to the bedside. Patient assisted the exist, husband provided transportation.

## 2019-02-16 LAB — CULTURE, BLOOD (ROUTINE X 2): Special Requests: ADEQUATE

## 2019-02-19 LAB — CULTURE, BLOOD (ROUTINE X 2)
Culture: NO GROWTH
Culture: NO GROWTH
Special Requests: ADEQUATE
Special Requests: ADEQUATE

## 2019-03-02 ENCOUNTER — Ambulatory Visit: Payer: Self-pay | Admitting: Internal Medicine

## 2019-03-07 ENCOUNTER — Other Ambulatory Visit: Payer: Self-pay

## 2019-03-07 ENCOUNTER — Encounter: Payer: Self-pay | Admitting: Internal Medicine

## 2019-03-07 ENCOUNTER — Ambulatory Visit: Payer: Self-pay | Admitting: Internal Medicine

## 2019-03-07 VITALS — BP 126/70 | HR 90 | Resp 12 | Ht 61.5 in | Wt 202.0 lb

## 2019-03-07 DIAGNOSIS — E1142 Type 2 diabetes mellitus with diabetic polyneuropathy: Secondary | ICD-10-CM

## 2019-03-07 DIAGNOSIS — U071 COVID-19: Secondary | ICD-10-CM

## 2019-03-07 DIAGNOSIS — N179 Acute kidney failure, unspecified: Secondary | ICD-10-CM

## 2019-03-07 NOTE — Progress Notes (Signed)
Subjective:    Patient ID: Kristine Atkinson, female   DOB: 26-Apr-1955, 64 y.o.   MRN: [id number]   HPI   1.  COVID 19 pneumonia:  Patient hospitalized soon after her husband for the same diagnosis. Received 5 day course of Remdesvir Corticosteroids for 10 days--5 more days following discharge.  Dexamethasone 6 mg daily for 5 days.  Should have completed on 02/20/2019.   Difficulty getting her focused.  Sounds like she gets short of breath when walking about for a distance.  Coughing still, but much improved.      2.  Acute Kidney injury:  Dehydrated on admission.  3.  DM:  Not checking sugars at home.  Cannot clarify if she took dexamethasone appropriately.   Not clear if has polydipsia. No definite polyuria as well DM was well controlled prior to illness.     Current Meds  Medication Sig  . AgaMatrix Ultra-Thin Lancets MISC Check blood glucose twice daily before meals  . ARIPiprazole Lauroxil ER (ARISTADA) 441 MG/1.6ML PRSY Inject 1 Dose into the muscle every 30 (thirty) days.   Marland Kitchen aspirin 81 MG tablet Take 1 tablet (81 mg total) by mouth daily.  . benztropine (COGENTIN) 1 MG tablet Take 1 tablet (1 mg total) by mouth 2 (two) times daily.  . carvedilol (COREG) 3.125 MG tablet Take 1 tablet (3.125 mg total) by mouth 2 (two) times daily.  . cyclobenzaprine (FLEXERIL) 10 MG tablet TAKE 1 TABLET BY MOUTH THREE TIMES A DAY AS NEEDED FOR MUSCLE SPASM (Patient taking differently: Take 10 mg by mouth 3 (three) times daily as needed for muscle spasms. )  . glucose blood (AGAMATRIX PRESTO TEST) test strip Check blood glucose twice daily before meals  . hydrochlorothiazide (HYDRODIURIL) 25 MG tablet 1 cap in the morning once daily. (Patient taking differently: Take 25 mg by mouth daily. )  . losartan (COZAAR) 50 MG tablet Take 1 tablet (50 mg total) by mouth daily.  . meloxicam (MOBIC) 15 MG tablet TAKE 1 TABLET BY MOUTH DAILY WITH FOOD FOR LEG PAIN (Patient taking differently: Take 15 mg by  mouth daily. )  . metFORMIN (GLUCOPHAGE-XR) 500 MG 24 hr tablet TAKE 1 TABLET BY MOUTH TWICE A DAY WITH MEALS (Patient taking differently: Take 500 mg by mouth 2 (two) times daily with a meal. )  . omeprazole (PRILOSEC) 20 MG capsule TAKE 1 CAPSULE BY MOUTH TWICE A DAY (Patient taking differently: Take 20 mg by mouth 2 (two) times daily. )  . pregabalin (LYRICA) 50 MG capsule Take 1 capsule (50 mg total) by mouth 3 (three) times daily.  . rosuvastatin (CRESTOR) 20 MG tablet 1 tab by mouth daily with evening meal (Patient taking differently: Take 20 mg by mouth daily with supper. )  . traZODone (DESYREL) 100 MG tablet Take 200 mg by mouth at bedtime.   Marland Kitchen venlafaxine XR (EFFEXOR-XR) 37.5 MG 24 hr capsule Take 37.5 mg by mouth daily with breakfast.   Allergies  Allergen Reactions  . Penicillins Itching    Did it involve swelling of the face/tongue/throat, SOB, or low BP? Unknown Did it involve sudden or severe rash/hives, skin peeling, or any reaction on the inside of your mouth or nose? Unknown Did you need to seek medical attention at a hospital or doctor's office? Unknown When did it last happen?Unknown If all above answers are "NO", may proceed with cephalosporin use.     Review of Systems    Objective:   BP 126/70 (  BP Location: Left Arm, Patient Position: Sitting, Cuff Size: Normal)   Pulse 90   Resp 12   Ht 5' 1.5" (1.562 m)   Wt 202 lb (91.6 kg)   SpO2 92%   BMI 37.55 kg/m   Physical Exam  NAD No coughing in exam room Lungs:  CTA CV:  RRR without murmur or rub .  Radial and DP pulses normal and equal Abd:  S, NT, + BS LE:  Trace edema   Assessment & Plan   1.  COvID 19 pneumonia:  Appears to be healing fine.  CBC  2.  DM:  Check glucose with CMP.  3.  AKI with dehydration in hospital:  CMP  Follow up in 3 months with fasting labs and appt 2 days later. Spoke with husband, who has been in hospital twice recently--let clinic know if any needs.

## 2019-03-08 LAB — CBC WITH DIFFERENTIAL/PLATELET
Basophils Absolute: 0 10*3/uL (ref 0.0–0.2)
Basos: 1 %
EOS (ABSOLUTE): 0.2 10*3/uL (ref 0.0–0.4)
Eos: 4 %
Hematocrit: 39.8 % (ref 34.0–46.6)
Hemoglobin: 13.1 g/dL (ref 11.1–15.9)
Immature Grans (Abs): 0 10*3/uL (ref 0.0–0.1)
Immature Granulocytes: 0 %
Lymphocytes Absolute: 2.2 10*3/uL (ref 0.7–3.1)
Lymphs: 35 %
MCH: 29.3 pg (ref 26.6–33.0)
MCHC: 32.9 g/dL (ref 31.5–35.7)
MCV: 89 fL (ref 79–97)
Monocytes Absolute: 0.4 10*3/uL (ref 0.1–0.9)
Monocytes: 7 %
Neutrophils Absolute: 3.3 10*3/uL (ref 1.4–7.0)
Neutrophils: 53 %
Platelets: 166 10*3/uL (ref 150–450)
RBC: 4.47 x10E6/uL (ref 3.77–5.28)
RDW: 17 % — ABNORMAL HIGH (ref 11.7–15.4)
WBC: 6.2 10*3/uL (ref 3.4–10.8)

## 2019-03-08 LAB — COMPREHENSIVE METABOLIC PANEL
ALT: 20 IU/L (ref 0–32)
AST: 19 IU/L (ref 0–40)
Albumin/Globulin Ratio: 2.1 (ref 1.2–2.2)
Albumin: 3.7 g/dL — ABNORMAL LOW (ref 3.8–4.8)
Alkaline Phosphatase: 72 IU/L (ref 39–117)
BUN/Creatinine Ratio: 8 — ABNORMAL LOW (ref 12–28)
BUN: 9 mg/dL (ref 8–27)
Bilirubin Total: 0.3 mg/dL (ref 0.0–1.2)
CO2: 26 mmol/L (ref 20–29)
Calcium: 8.8 mg/dL (ref 8.7–10.3)
Chloride: 104 mmol/L (ref 96–106)
Creatinine, Ser: 1.15 mg/dL — ABNORMAL HIGH (ref 0.57–1.00)
GFR calc Af Amer: 59 mL/min/{1.73_m2} — ABNORMAL LOW (ref >59)
GFR calc non Af Amer: 51 mL/min/{1.73_m2} — ABNORMAL LOW (ref >59)
Globulin, Total: 1.8 g/dL (ref 1.5–4.5)
Glucose: 125 mg/dL — ABNORMAL HIGH (ref 65–99)
Potassium: 4.1 mmol/L (ref 3.5–5.2)
Sodium: 143 mmol/L (ref 134–144)
Total Protein: 5.5 g/dL — ABNORMAL LOW (ref 6.0–8.5)

## 2019-03-10 ENCOUNTER — Other Ambulatory Visit: Payer: Self-pay | Admitting: Internal Medicine

## 2019-03-10 DIAGNOSIS — K219 Gastro-esophageal reflux disease without esophagitis: Secondary | ICD-10-CM

## 2019-03-24 ENCOUNTER — Encounter: Payer: Self-pay | Admitting: Internal Medicine

## 2019-04-06 ENCOUNTER — Ambulatory Visit: Payer: No Typology Code available for payment source | Attending: Internal Medicine

## 2019-04-06 DIAGNOSIS — Z23 Encounter for immunization: Secondary | ICD-10-CM

## 2019-04-06 NOTE — Progress Notes (Signed)
   Covid-19 Vaccination Clinic  Name:  Kristine Atkinson    MRN: [id number] DOB: April 13, 1955  04/06/2019  [name] was observed post Covid-19 immunization for 15 minutes without incident. She was provided with Vaccine Information Sheet and instruction to access the V-Safe system.   [name] was instructed to call 911 with any severe reactions post vaccine: Marland Kitchen Difficulty breathing  . Swelling of face and throat  . A fast heartbeat  . A bad rash all over body  . Dizziness and weakness   Immunizations Administered    Name Date Dose VIS Date Route   Moderna COVID-19 Vaccine 04/06/2019  3:27 PM 0.5 mL 12/13/2018 Intramuscular   Manufacturer: Moderna   Lot: [id number]   [id number]

## 2019-04-27 ENCOUNTER — Other Ambulatory Visit: Payer: Self-pay | Admitting: Internal Medicine

## 2019-05-09 ENCOUNTER — Ambulatory Visit: Payer: No Typology Code available for payment source | Attending: Family

## 2019-05-09 DIAGNOSIS — Z23 Encounter for immunization: Secondary | ICD-10-CM

## 2019-05-09 NOTE — Progress Notes (Signed)
   Covid-19 Vaccination Clinic  Name:  Kristine Atkinson    MRN: [id number] DOB: Aug 30, 1955  05/09/2019  Kristine Atkinson was observed post Covid-19 immunization for 15 minutes without incident. She was provided with Vaccine Information Sheet and instruction to access the V-Safe system.   Kristine Atkinson was instructed to call 911 with any severe reactions post vaccine: Marland Kitchen Difficulty breathing  . Swelling of face and throat  . A fast heartbeat  . A bad rash all over body  . Dizziness and weakness   Immunizations Administered    Name Date Dose VIS Date Route   Moderna COVID-19 Vaccine 05/09/2019  2:51 PM 0.5 mL 12/2018 Intramuscular   Manufacturer: Moderna   Lot: [id number]   [id number]

## 2019-06-09 ENCOUNTER — Other Ambulatory Visit: Payer: Self-pay

## 2019-06-13 ENCOUNTER — Encounter: Payer: Self-pay | Admitting: Internal Medicine

## 2019-06-16 ENCOUNTER — Other Ambulatory Visit: Payer: Self-pay

## 2019-06-16 DIAGNOSIS — Z79899 Other long term (current) drug therapy: Secondary | ICD-10-CM

## 2019-06-16 DIAGNOSIS — E785 Hyperlipidemia, unspecified: Secondary | ICD-10-CM

## 2019-06-16 DIAGNOSIS — E1143 Type 2 diabetes mellitus with diabetic autonomic (poly)neuropathy: Secondary | ICD-10-CM

## 2019-06-16 NOTE — Addendum Note (Signed)
Addended by: Serafina Mitchell on: 06/16/2019 03:29 PM   Modules accepted: Orders

## 2019-06-17 LAB — COMPREHENSIVE METABOLIC PANEL
ALT: 8 IU/L (ref 0–32)
AST: 14 IU/L (ref 0–40)
Albumin/Globulin Ratio: 1.7 (ref 1.2–2.2)
Albumin: 4.2 g/dL (ref 3.8–4.8)
Alkaline Phosphatase: 92 IU/L (ref 48–121)
BUN/Creatinine Ratio: 12 (ref 12–28)
BUN: 15 mg/dL (ref 8–27)
Bilirubin Total: <0.2 mg/dL (ref 0.0–1.2)
CO2: 25 mmol/L (ref 20–29)
Calcium: 9 mg/dL (ref 8.7–10.3)
Chloride: 102 mmol/L (ref 96–106)
Creatinine, Ser: 1.23 mg/dL — ABNORMAL HIGH (ref 0.57–1.00)
GFR calc Af Amer: 54 mL/min/{1.73_m2} — ABNORMAL LOW (ref >59)
GFR calc non Af Amer: 47 mL/min/{1.73_m2} — ABNORMAL LOW (ref >59)
Globulin, Total: 2.5 g/dL (ref 1.5–4.5)
Glucose: 82 mg/dL (ref 65–99)
Potassium: 4 mmol/L (ref 3.5–5.2)
Sodium: 143 mmol/L (ref 134–144)
Total Protein: 6.7 g/dL (ref 6.0–8.5)

## 2019-06-17 LAB — MICROALBUMIN / CREATININE URINE RATIO
Creatinine, Urine: 105.8 mg/dL
Microalb/Creat Ratio: 8 mg/g creat (ref 0–29)
Microalbumin, Urine: 8.2 ug/mL

## 2019-06-17 LAB — LIPID PANEL W/O CHOL/HDL RATIO
Cholesterol, Total: 146 mg/dL (ref 100–199)
HDL: 44 mg/dL (ref >39)
LDL Chol Calc (NIH): 81 mg/dL (ref 0–99)
Triglycerides: 118 mg/dL (ref 0–149)
VLDL Cholesterol Cal: 21 mg/dL (ref 5–40)

## 2019-06-17 LAB — HGB A1C W/O EAG: Hgb A1c MFr Bld: 6.6 % — ABNORMAL HIGH (ref 4.8–5.6)

## 2019-06-27 ENCOUNTER — Encounter (HOSPITAL_COMMUNITY): Payer: Self-pay | Admitting: Psychiatry

## 2019-06-27 ENCOUNTER — Ambulatory Visit (INDEPENDENT_AMBULATORY_CARE_PROVIDER_SITE_OTHER): Payer: No Payment, Other | Admitting: Psychiatry

## 2019-06-27 ENCOUNTER — Encounter (HOSPITAL_COMMUNITY): Payer: Self-pay

## 2019-06-27 ENCOUNTER — Ambulatory Visit (INDEPENDENT_AMBULATORY_CARE_PROVIDER_SITE_OTHER): Payer: No Payment, Other | Admitting: *Deleted

## 2019-06-27 ENCOUNTER — Other Ambulatory Visit: Payer: Self-pay

## 2019-06-27 VITALS — BP 187/108 | HR 93 | Ht 63.5 in | Wt 207.0 lb

## 2019-06-27 DIAGNOSIS — F200 Paranoid schizophrenia: Secondary | ICD-10-CM

## 2019-06-27 MED ORDER — ARISTADA 441 MG/1.6ML IM PRSY: 441.0000 mg | PREFILLED_SYRINGE | INTRAMUSCULAR | 11 refills | Status: DC

## 2019-06-27 MED ORDER — HALOPERIDOL 0.5 MG PO TABS: 0.5000 mg | ORAL_TABLET | Freq: Two times a day (BID) | ORAL | 1 refills | Status: DC

## 2019-06-27 MED ORDER — ARIPIPRAZOLE LAUROXIL ER 441 MG/1.6ML IM PRSY
441.0000 mg | PREFILLED_SYRINGE | Freq: Once | INTRAMUSCULAR | Status: AC
  Administered 2019-06-27: 441 mg via INTRAMUSCULAR

## 2019-06-27 MED ORDER — BENZTROPINE MESYLATE 1 MG PO TABS: 1.0000 mg | ORAL_TABLET | Freq: Two times a day (BID) | ORAL | 1 refills | Status: DC

## 2019-06-27 MED ORDER — TRAZODONE HCL 300 MG PO TABS: 300.0000 mg | ORAL_TABLET | Freq: Every day | ORAL | 1 refills | Status: DC

## 2019-06-27 MED ORDER — CITALOPRAM HYDROBROMIDE 10 MG PO TABS: 10.0000 mg | ORAL_TABLET | Freq: Every day | ORAL | 1 refills | Status: DC

## 2019-06-27 NOTE — Progress Notes (Signed)
First time meeting patient as she is transferring from [place] services to [place]. She did bring her medication for inj from [facility]. She takes Aristada 441 mg IM q month. She is pleasant, states she is well and denies any SI, HI or psychosis. She asked how to use her O2 she apparently has at home and if she is being charged for it.Gave her the name and number of the company probably providing the O2 and also asked her to have her husband call here if needed more help. She was rescheduled for one month for her next injection.

## 2019-06-27 NOTE — Progress Notes (Signed)
Psychiatric Initial Adult Assessment   Patient Identification: Kristine Atkinson MRN:  [id number] Date of Evaluation:  06/27/2019    Referral Source: Beverly Sessions  Chief Complaint:  " I am feeling sad."   Visit Diagnosis:    ICD-10-CM   1. Schizophrenia, paranoid ([place])  F20.0     History of Present Illness:  Patient is a 64 year old female who is being seen today for a psychiatric evaluation. Patient was referred to psychiatric outpatient by [facility] for medication management. Patient has a long standing psychiatric history paranoid schizophrenia.   Patient's current medications include IM Aristada 441 mg every monthly, Cogentin 1 mg twice daily, trazodone 200 mg at bedtime, Haldol 0.5 mg twice daily.  Patient reports feeling sad all the time and crying often.  Stated that she has no energy to do anything.  She is isolative at home and does not go out much.  She also reported feeling anxious at times. She denied any suicidal ideations. She also complained of poor sleep.  Patient states that the current dosage of her trazodone is not effective and request an increase in dosage.   She reports baseline auditory hallucinations.  She stated that she is not as paranoid as she used to be.    She appeared to be disorganized in her thought process.  She needed the questions to be repeated to her several times.  Patient was offered Celexa help with depressive symptoms.  Is requesting increasing her dose of trazodone so that she can sleep better.   Past Psychiatric History: Patient has a past psychiatric history significant for paranoid schizophrenia, depression.  Previous Psychotropic Medications: Yes   Substance Abuse History in the last 12 months:  No.  Consequences of Substance Abuse: NA  Past Medical History:  Past Medical History:  Diagnosis Date  . Cellulitis 06/06/2013  . Chest pain 06/07/2015  . Chronic pain   . Cocaine abuse ([place]) 09/09/2012   Cocaine positive on UDS 09/09/2012.     . Diabetes mellitus   . Elevated serum creatinine 08/14/2013  . Fibromyalgia   . Greater trochanteric bursitis of left hip 05/29/2014  . Hyperlipidemia   . Hypertension   . Syncope 08/14/2013    Past Surgical History:  Procedure Laterality Date  . OOPHORECTOMY     ectopic    Family Psychiatric History: denied  Family History:  Family History  Problem Relation Age of Onset  . Hypertension Mother   . Cancer Maternal Aunt        colon cancer    Social History:   Social History   Socioeconomic History  . Marital status: Married    Spouse name: Not on file  . Number of children: Not on file  . Years of education: Not on file  . Highest education level: Not on file  Occupational History  . Not on file  Tobacco Use  . Smoking status: Former Smoker    Packs/day: 0.20    Years: 41.00    Pack years: 8.20    Types: Cigarettes    Start date: 01/12/1973    Quit date: 06/13/2015    Years since quitting: 4.0  . Smokeless tobacco: Never Used  . Tobacco comment: smokes about 3-4 cigs per day  as of  02/2014  Substance and Sexual Activity  . Alcohol use: No    Alcohol/week: 0.0 standard drinks  . Drug use: Not on file  . Sexual activity: Not on file  Other Topics Concern  . Not on  file  Social History Narrative   Lives with husband Aram Candela).  Has 3 grown children.  Does not work.  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   Social Determinants of Health   Financial Resource Strain:   . Difficulty of Paying Living Expenses:   Food Insecurity:   . Worried About Charity fundraiser in the Last Year:   . Arboriculturist in the Last Year:   Transportation Needs:   . Film/video editor (Medical):   Marland Kitchen Lack of Transportation (Non-Medical):   Physical Activity:   . Days of Exercise per Week:   . Minutes of Exercise per Session:   Stress:   . Feeling of Stress :   Social Connections:   . Frequency of Communication with Friends and Family:    . Frequency of Social Gatherings with Friends and Family:   . Attends Religious Services:   . Active Member of Clubs or Organizations:   . Attends Archivist Meetings:   Marland Kitchen Marital Status:     Additional Social History: Lives with husband  Allergies:   Allergies  Allergen Reactions  . Penicillins Itching    Did it involve swelling of the face/tongue/throat, SOB, or low BP? Unknown Did it involve sudden or severe rash/hives, skin peeling, or any reaction on the inside of your mouth or nose? Unknown Did you need to seek medical attention at a hospital or doctor's office? Unknown When did it last happen?Unknown If all above answers are "NO", may proceed with cephalosporin use.    Metabolic Disorder Labs: Lab Results  Component Value Date   HGBA1C 6.6 (H) 06/16/2019   No results found for: PROLACTIN Lab Results  Component Value Date   CHOL 146 06/16/2019   TRIG 118 06/16/2019   HDL 44 06/16/2019   CHOLHDL 4.8 08/27/2014   VLDL 26 08/27/2014   LDLCALC 81 06/16/2019   LDLCALC 50 10/14/2018   Lab Results  Component Value Date   TSH 0.667 03/02/2012    Therapeutic Level Labs: No results found for: LITHIUM No results found for: CBMZ No results found for: VALPROATE  Current Medications: Current Outpatient Medications  Medication Sig Dispense Refill  . AgaMatrix Ultra-Thin Lancets MISC Check blood glucose twice daily before meals 100 each 11  . ARIPiprazole Lauroxil ER (ARISTADA) 441 MG/1.6ML PRSY Inject 1 Dose into the muscle every 30 (thirty) days.     Marland Kitchen aspirin 81 MG tablet Take 1 tablet (81 mg total) by mouth daily. 100 tablet 1  . benztropine (COGENTIN) 1 MG tablet Take 1 tablet (1 mg total) by mouth 2 (two) times daily.    . carvedilol (COREG) 3.125 MG tablet TAKE 1 TABLET (3.125 MG TOTAL) BY MOUTH 2 (TWO) TIMES DAILY. 60 tablet 11  . cyclobenzaprine (FLEXERIL) 10 MG tablet TAKE 1 TABLET BY MOUTH THREE TIMES A DAY AS NEEDED FOR MUSCLE SPASM 30 tablet 2  .  glucose blood (AGAMATRIX PRESTO TEST) test strip Check blood glucose twice daily before meals 100 each 11  . hydrochlorothiazide (HYDRODIURIL) 25 MG tablet TAKE 1 TABLET BY MOUTH DAILY 30 tablet 11  . losartan (COZAAR) 50 MG tablet TAKE 1 TABLET (50 MG TOTAL) BY MOUTH DAILY. 30 tablet 11  . meloxicam (MOBIC) 15 MG tablet TAKE 1 TABLET BY MOUTH DAILY WITH FOOD FOR LEG PAIN (Patient taking differently: Take 15 mg by mouth daily. ) 30 tablet 11  . metFORMIN (GLUCOPHAGE-XR) 500 MG 24 hr tablet TAKE  1 TABLET BY MOUTH TWICE A DAY WITH MEALS (Patient taking differently: Take 500 mg by mouth 2 (two) times daily with a meal. ) 60 tablet 11  . omeprazole (PRILOSEC) 20 MG capsule TAKE 1 CAPSULE BY MOUTH TWICE A DAY 30 capsule 5  . pregabalin (LYRICA) 50 MG capsule Take 1 capsule (50 mg total) by mouth 3 (three) times daily. 270 capsule 3  . rosuvastatin (CRESTOR) 20 MG tablet 1 tab by mouth daily with evening meal (Patient taking differently: Take 20 mg by mouth daily with supper. ) 30 tablet 11  . traZODone (DESYREL) 100 MG tablet Take 200 mg by mouth at bedtime.     Marland Kitchen venlafaxine XR (EFFEXOR-XR) 37.5 MG 24 hr capsule Take 37.5 mg by mouth daily with breakfast.     No current facility-administered medications for this visit.    Musculoskeletal: Strength & Muscle Tone: within normal limits Gait & Station: normal Patient leans: N/A  Psychiatric Specialty Exam: Review of Systems  There were no vitals taken for this visit.There is no height or weight on file to calculate BMI.  General Appearance: Fairly Groomed  Eye Contact:  Good  Speech:  Clear and Coherent and Slow  Volume:  Normal  Mood:  Depressed  Affect:  Blunt and Congruent  Thought Process:  Disorganized and Descriptions of Associations: Circumstantial  Orientation:  Full (Time, Place, and Person)  Thought Content:  Hallucinations: Auditory  Suicidal Thoughts:  No  Homicidal Thoughts:  No  Memory:  Immediate;   Fair Recent;   Fair   Judgement:  Fair  Insight:  Fair  Psychomotor Activity:  Normal  Concentration:  Concentration: Fair and Attention Span: Fair  Recall:  [employer] of Knowledge:Good  Language: Good  Akathisia:  Negative  Handed:  Right  AIMS (if indicated):  Not done  Assets:  Communication Skills Desire for Improvement Financial Resources/Insurance Housing Social Support  ADL's:  Intact  Cognition: WNL  Sleep:  Poor   Screenings: PHQ2-9     Office Visit from 12/27/2015 in [place] Office Visit from 09/05/2015 in [place] Office Visit from 06/28/2015 in [place] Office Visit from 06/07/2015 in [place] Office Visit from 03/22/2015 in [place]  PHQ-2 Total Score 0 0 0 0 0  PHQ-9 Total Score 0 0 -- -- --      Assessment and Plan: Patient is reporting improvement in her psychotic symptoms with help of monthly Aristada injections.  However she is complaining of feeling depressed and tearful.  She was agreeable to adding Celexa to help her depressed mood.  She also complained of poor sleep and is agreeable to increasing the dose of trazodone.  1. Schizophrenia, paranoid ([place])  - ARIPiprazole Lauroxil ER (ARISTADA) 441 MG/1.6ML prefilled syringe; Inject 441 mg into the muscle every 30 (thirty) days.  Dispense: 1.5 mL; Refill: 11 - benztropine (COGENTIN) 1 MG tablet; Take 1 tablet (1 mg total) by mouth 2 (two) times daily.  Dispense: 60 tablet; Refill: 1 - trazodone (DESYREL) 300 MG tablet; Take 1 tablet (300 mg total) by mouth at bedtime.  Dispense: 30 tablet; Refill: 1 - haloperidol (HALDOL) 0.5 MG tablet; Take 1 tablet (0.5 mg total) by mouth 2 (two) times daily.  Dispense: 60 tablet; Refill: 1 - Start citalopram (CELEXA) 10 MG tablet; Take 1 tablet (10 mg total) by mouth daily.  Dispense: 30 tablet; Refill: 1  She received  her dose of IM Aristada 441 mg today.    [place] Crane, MD 6/15/20213:10 PM

## 2019-07-21 ENCOUNTER — Other Ambulatory Visit: Payer: Self-pay | Admitting: Internal Medicine

## 2019-07-24 ENCOUNTER — Encounter: Payer: Self-pay | Admitting: Internal Medicine

## 2019-07-27 ENCOUNTER — Encounter (HOSPITAL_COMMUNITY): Payer: No Typology Code available for payment source | Admitting: Psychiatry

## 2019-07-27 ENCOUNTER — Ambulatory Visit (HOSPITAL_COMMUNITY): Payer: No Typology Code available for payment source

## 2019-07-28 ENCOUNTER — Ambulatory Visit (HOSPITAL_COMMUNITY): Payer: No Typology Code available for payment source

## 2019-07-28 ENCOUNTER — Encounter (HOSPITAL_COMMUNITY): Payer: No Typology Code available for payment source | Admitting: Psychiatry

## 2019-07-31 ENCOUNTER — Other Ambulatory Visit: Payer: Self-pay

## 2019-07-31 ENCOUNTER — Ambulatory Visit (HOSPITAL_COMMUNITY): Payer: No Payment, Other | Admitting: *Deleted

## 2019-07-31 ENCOUNTER — Encounter (HOSPITAL_COMMUNITY): Payer: Self-pay

## 2019-07-31 ENCOUNTER — Encounter (HOSPITAL_COMMUNITY): Payer: Self-pay | Admitting: Psychiatry

## 2019-07-31 ENCOUNTER — Ambulatory Visit (INDEPENDENT_AMBULATORY_CARE_PROVIDER_SITE_OTHER): Payer: No Payment, Other | Admitting: Psychiatry

## 2019-07-31 VITALS — BP 152/96 | HR 87 | Ht <= 58 in | Wt 208.0 lb

## 2019-07-31 DIAGNOSIS — F200 Paranoid schizophrenia: Secondary | ICD-10-CM | POA: Diagnosis not present

## 2019-07-31 MED ORDER — ARIPIPRAZOLE LAUROXIL ER 441 MG/1.6ML IM PRSY
441.0000 mg | PREFILLED_SYRINGE | Freq: Once | INTRAMUSCULAR | Status: AC
  Administered 2019-07-31: 441 mg via INTRAMUSCULAR

## 2019-07-31 MED ORDER — CITALOPRAM HYDROBROMIDE 10 MG PO TABS: 10.0000 mg | ORAL_TABLET | Freq: Every day | ORAL | 1 refills | Status: DC

## 2019-07-31 MED ORDER — HALOPERIDOL 0.5 MG PO TABS: 0.5000 mg | ORAL_TABLET | Freq: Two times a day (BID) | ORAL | 1 refills | Status: DC

## 2019-07-31 MED ORDER — TEMAZEPAM 15 MG PO CAPS: 15.0000 mg | ORAL_CAPSULE | Freq: Every evening | ORAL | 1 refills | Status: DC | PRN

## 2019-07-31 MED ORDER — BENZTROPINE MESYLATE 1 MG PO TABS: 1.0000 mg | ORAL_TABLET | Freq: Two times a day (BID) | ORAL | 1 refills | Status: DC

## 2019-07-31 NOTE — Progress Notes (Signed)
BH MD/PA/NP OP Progress Note  07/31/2019 3:48 PM Kristine Atkinson  MRN:  [id number]  Chief Complaint: " I am okay."  HPI: Patient reported that she is doing fine.  She informed that she is not feeling sad anymore and also does not have frequent crying spells anymore.  She stated that she has hard time at night.  Upon probing it was clarified that she has a hard time going to sleep and she stays awake most of the night.  She jokingly said that she would rather be doing her housework like baking chicken rather than laying in the bed with eyes wide open. She was willing to try different sleeping medicine or go up on the dose of trazodone.  It was noted that she is already on a very high dose of trazodone so the decision to try different sleeping medicine was made.  Visit Diagnosis:    ICD-10-CM   1. Schizophrenia, paranoid ([place])  F20.0 ARIPiprazole Lauroxil ER (ARISTADA) 441 MG/1.6ML prefilled syringe 441 mg    Past Psychiatric History: Schizophrenia  Past Medical History:  Past Medical History:  Diagnosis Date  . Cellulitis 06/06/2013  . Chest pain 06/07/2015  . Chronic pain   . Cocaine abuse ([place]) 09/09/2012   Cocaine positive on UDS 09/09/2012.    . Diabetes mellitus   . Elevated serum creatinine 08/14/2013  . Fibromyalgia   . Greater trochanteric bursitis of left hip 05/29/2014  . Hyperlipidemia   . Hypertension   . Syncope 08/14/2013    Past Surgical History:  Procedure Laterality Date  . OOPHORECTOMY     ectopic    Family Psychiatric History: denied  Family History:  Family History  Problem Relation Age of Onset  . Hypertension Mother   . Cancer Maternal Aunt        colon cancer    Social History:  Social History   Socioeconomic History  . Marital status: Married    Spouse name: Not on file  . Number of children: Not on file  . Years of education: Not on file  . Highest education level: Not on file  Occupational History  . Not on file  Tobacco Use  . Smoking  status: Former Smoker    Packs/day: 0.20    Years: 41.00    Pack years: 8.20    Types: Cigarettes    Start date: 01/12/1973    Quit date: 06/13/2015    Years since quitting: 4.1  . Smokeless tobacco: Never Used  . Tobacco comment: smokes about 3-4 cigs per day  as of  02/2014  Substance and Sexual Activity  . Alcohol use: No    Alcohol/week: 0.0 standard drinks  . Drug use: Not on file  . Sexual activity: Not on file  Other Topics Concern  . Not on file  Social History Narrative   Lives with husband Josilyn Shippee).  Has 3 grown children.  Does not work.  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   Social Determinants of Health   Financial Resource Strain:   . Difficulty of Paying Living Expenses:   Food Insecurity:   . Worried About Charity fundraiser in the Last Year:   . Arboriculturist in the Last Year:   Transportation Needs:   . Film/video editor (Medical):   Marland Kitchen Lack of Transportation (Non-Medical):   Physical Activity:   . Days of Exercise per Week:   . Minutes of Exercise per Session:  Stress:   . Feeling of Stress :   Social Connections:   . Frequency of Communication with Friends and Family:   . Frequency of Social Gatherings with Friends and Family:   . Attends Religious Services:   . Active Member of Clubs or Organizations:   . Attends Archivist Meetings:   Marland Kitchen Marital Status:     Allergies:  Allergies  Allergen Reactions  . Penicillins Itching    Did it involve swelling of the face/tongue/throat, SOB, or low BP? Unknown Did it involve sudden or severe rash/hives, skin peeling, or any reaction on the inside of your mouth or nose? Unknown Did you need to seek medical attention at a hospital or doctor's office? Unknown When did it last happen?Unknown If all above answers are "NO", may proceed with cephalosporin use.    Metabolic Disorder Labs: Lab Results  Component Value Date   HGBA1C 6.6 (H) 06/16/2019   No  results found for: PROLACTIN Lab Results  Component Value Date   CHOL 146 06/16/2019   TRIG 118 06/16/2019   HDL 44 06/16/2019   CHOLHDL 4.8 08/27/2014   VLDL 26 08/27/2014   LDLCALC 81 06/16/2019   LDLCALC 50 10/14/2018   Lab Results  Component Value Date   TSH 0.667 03/02/2012   TSH 1.74 05/13/2011    Therapeutic Level Labs: No results found for: LITHIUM No results found for: VALPROATE No components found for:  CBMZ  Current Medications: Current Outpatient Medications  Medication Sig Dispense Refill  . AgaMatrix Ultra-Thin Lancets MISC Check blood glucose twice daily before meals 100 each 11  . ARIPiprazole Lauroxil ER (ARISTADA) 441 MG/1.6ML prefilled syringe Inject 441 mg into the muscle every 30 (thirty) days. 1.5 mL 11  . aspirin 81 MG tablet Take 1 tablet (81 mg total) by mouth daily. 100 tablet 1  . benztropine (COGENTIN) 1 MG tablet Take 1 tablet (1 mg total) by mouth 2 (two) times daily. 60 tablet 1  . carvedilol (COREG) 3.125 MG tablet TAKE 1 TABLET (3.125 MG TOTAL) BY MOUTH 2 (TWO) TIMES DAILY. 60 tablet 11  . citalopram (CELEXA) 10 MG tablet Take 1 tablet (10 mg total) by mouth daily. 30 tablet 1  . cyclobenzaprine (FLEXERIL) 10 MG tablet TAKE 1 TABLET BY MOUTH THREE TIMES A DAY AS NEEDED FOR MUSCLE SPASM 30 tablet 2  . glucose blood (AGAMATRIX PRESTO TEST) test strip Check blood glucose twice daily before meals 100 each 11  . haloperidol (HALDOL) 0.5 MG tablet Take 1 tablet (0.5 mg total) by mouth 2 (two) times daily. 60 tablet 1  . hydrochlorothiazide (HYDRODIURIL) 25 MG tablet TAKE 1 TABLET BY MOUTH DAILY 30 tablet 11  . losartan (COZAAR) 50 MG tablet TAKE 1 TABLET (50 MG TOTAL) BY MOUTH DAILY. 30 tablet 11  . meloxicam (MOBIC) 15 MG tablet TAKE 1 TABLET BY MOUTH DAILY WITH FOOD FOR LEG PAIN (Patient taking differently: Take 15 mg by mouth daily. ) 30 tablet 11  . metFORMIN (GLUCOPHAGE-XR) 500 MG 24 hr tablet TAKE 1 TABLET BY MOUTH TWICE A DAY WITH MEALS 60 tablet  11  . omeprazole (PRILOSEC) 20 MG capsule TAKE 1 CAPSULE BY MOUTH TWICE A DAY 30 capsule 5  . pregabalin (LYRICA) 50 MG capsule Take 1 capsule (50 mg total) by mouth 3 (three) times daily. 270 capsule 3  . rosuvastatin (CRESTOR) 20 MG tablet TAKE 1 TABLET BY MOUTH DAILY WITH EVENING MEAL 30 tablet 11  . trazodone (DESYREL) 300 MG tablet Take  1 tablet (300 mg total) by mouth at bedtime. 30 tablet 1   Current Facility-Administered Medications  Medication Dose Route Frequency Provider Last Rate Last Admin  . ARIPiprazole Lauroxil ER (ARISTADA) 441 MG/1.6ML prefilled syringe 441 mg  441 mg Intramuscular Once [place] Crane, MD         Musculoskeletal: Strength & Muscle Tone: within normal limits Gait & Station: normal Patient leans: N/A  Psychiatric Specialty Exam: Review of Systems  There were no vitals taken for this visit.There is no height or weight on file to calculate BMI.  General Appearance: Fairly Groomed  Eye Contact:  Good  Speech:  Clear and Coherent and Normal Rate  Volume:  Normal  Mood:  Euthymic  Affect:  Congruent  Thought Process:  Goal Directed and Descriptions of Associations: Intact  Orientation:  Full (Time, Place, and Person)  Thought Content: Illogical and Rumination   Suicidal Thoughts:  No  Homicidal Thoughts:  No  Memory:  Immediate;   Good Recent;   Good  Judgement:  Fair  Insight:  Fair  Psychomotor Activity:  Restlessness  Concentration:  Concentration: Good and Attention Span: Good  Recall:  Good  Fund of Knowledge: Good  Language: Good  Akathisia:  Negative  Handed:  Right  AIMS (if indicated): not done  Assets:  Communication Skills Desire for Improvement Financial Resources/Insurance Housing Social Support  ADL's:  Intact  Cognition: WNL  Sleep:  Poor   Screenings: PHQ2-9     Office Visit from 12/27/2015 in [place] Office Visit from 09/05/2015 in [place] Office Visit from 06/28/2015 in  [place] Office Visit from 06/07/2015 in [place] Office Visit from 03/22/2015 in [place]  PHQ-2 Total Score 0 0 0 0 0  PHQ-9 Total Score 0 0 -- -- --       Assessment and Plan: Patient reported improvement in her crying spells after being started on Celexa however she is complaining of poor sleep.  She is already on high dose of trazodone so therefore would try a different medication.  Temazepam 15 mg prescription was prescribed. Potential side effects of medication and risks vs benefits of treatment vs non-treatment were explained and discussed. All questions were answered.  1. Schizophrenia, paranoid ([place])  - ARIPiprazole Lauroxil ER (ARISTADA) 441 MG/1.6ML prefilled syringe 441 mg. Dose administered today. - benztropine (COGENTIN) 1 MG tablet; Take 1 tablet (1 mg total) by mouth 2 (two) times daily.  Dispense: 60 tablet; Refill: 1 - citalopram (CELEXA) 10 MG tablet; Take 1 tablet (10 mg total) by mouth daily.  Dispense: 30 tablet; Refill: 1 - haloperidol (HALDOL) 0.5 MG tablet; Take 1 tablet (0.5 mg total) by mouth 2 (two) times daily.  Dispense: 60 tablet; Refill: 1 - Start temazepam (RESTORIL) 15 MG capsule; Take 1 capsule (15 mg total) by mouth at bedtime as needed for sleep.  Dispense: 30 capsule; Refill: 1  F/up in 2 months.   [place] Crane, MD 07/31/2019, 3:48 PM

## 2019-07-31 NOTE — Progress Notes (Signed)
Patient in as scheduled to see Dr Toy Care and to also receive injection of Aristada. He husband brought her and waited in the lobby. She states she is still having difficulty sleeping but no other complaints. She is pleasant and appropriate but concrete in responses. Aristada 441 mg IM in L deltoid, she tolerated it well. Scheduled back for next injection in one month and to see Dr Toy Care in two months.

## 2019-08-28 ENCOUNTER — Other Ambulatory Visit (HOSPITAL_COMMUNITY): Payer: Self-pay | Admitting: Psychiatry

## 2019-08-28 ENCOUNTER — Ambulatory Visit (HOSPITAL_COMMUNITY): Payer: No Payment, Other | Admitting: *Deleted

## 2019-08-28 ENCOUNTER — Other Ambulatory Visit: Payer: Self-pay

## 2019-08-28 ENCOUNTER — Encounter (HOSPITAL_COMMUNITY): Payer: Self-pay

## 2019-08-28 VITALS — BP 180/93 | HR 89 | Temp 99.0°F | Wt 201.0 lb

## 2019-08-28 DIAGNOSIS — F200 Paranoid schizophrenia: Secondary | ICD-10-CM

## 2019-08-28 MED ORDER — ARIPIPRAZOLE LAUROXIL ER 441 MG/1.6ML IM PRSY
441.0000 mg | PREFILLED_SYRINGE | Freq: Once | INTRAMUSCULAR | Status: AC
  Administered 2019-08-28: 441 mg via INTRAMUSCULAR

## 2019-08-28 NOTE — Progress Notes (Signed)
In as scheduled for her Aristada Injection, given today in her R deltoid. She is pleasant, laughing appropriately and talking about what she is going to cook and eat when she gets home. When she was walked out to lobby where her husband was waiting he said he is the one that has started cooking the spaghetti. She denies any issues with her mental illness. She is to return in one month for next injection and she has po medications until that next appt.

## 2019-09-19 ENCOUNTER — Other Ambulatory Visit: Payer: Self-pay | Admitting: Internal Medicine

## 2019-09-19 ENCOUNTER — Telehealth: Payer: Self-pay | Admitting: Internal Medicine

## 2019-09-19 DIAGNOSIS — K219 Gastro-esophageal reflux disease without esophagitis: Secondary | ICD-10-CM

## 2019-09-19 MED ORDER — PREGABALIN 50 MG PO CAPS: 50.0000 mg | ORAL_CAPSULE | Freq: Three times a day (TID) | ORAL | 3 refills | Status: DC

## 2019-09-19 NOTE — Telephone Encounter (Signed)
Patient called requesting updated information on application dropped off at the office 2 weeks ago  to obtain pregabalin (LYRICA) 50 MG capsule. Patient stated nothing has been received just yet.

## 2019-09-20 NOTE — Telephone Encounter (Signed)
Spoke with patient and husband. Lyrica prescription and forms were faxed to [facility] this morning. Patient verbalized understanding.

## 2019-09-27 ENCOUNTER — Encounter (HOSPITAL_COMMUNITY): Payer: Self-pay

## 2019-09-27 ENCOUNTER — Other Ambulatory Visit: Payer: Self-pay

## 2019-09-27 ENCOUNTER — Ambulatory Visit (HOSPITAL_COMMUNITY): Payer: No Payment, Other | Admitting: *Deleted

## 2019-09-27 VITALS — BP 159/87 | HR 98 | Wt 198.0 lb

## 2019-09-27 DIAGNOSIS — F200 Paranoid schizophrenia: Secondary | ICD-10-CM

## 2019-09-27 MED ORDER — ARIPIPRAZOLE LAUROXIL ER 441 MG/1.6ML IM PRSY
441.0000 mg | PREFILLED_SYRINGE | Freq: Once | INTRAMUSCULAR | Status: AC
  Administered 2019-09-27: 441 mg via INTRAMUSCULAR

## 2019-09-27 NOTE — Progress Notes (Signed)
In as scheduled for monthly Aristada Injection. She received 441 mg in her R deltoid as ordered. She is pleasant, bright and verbal. She denies any psychotic sx. Her husband brings her but remains in waiting room for her. Return in one month for next injection and has an appt next week with provider.

## 2019-10-02 ENCOUNTER — Ambulatory Visit (INDEPENDENT_AMBULATORY_CARE_PROVIDER_SITE_OTHER): Payer: No Payment, Other | Admitting: Psychiatry

## 2019-10-02 ENCOUNTER — Other Ambulatory Visit: Payer: Self-pay

## 2019-10-02 ENCOUNTER — Encounter (HOSPITAL_COMMUNITY): Payer: Self-pay | Admitting: Psychiatry

## 2019-10-02 DIAGNOSIS — F200 Paranoid schizophrenia: Secondary | ICD-10-CM | POA: Diagnosis not present

## 2019-10-02 MED ORDER — BENZTROPINE MESYLATE 1 MG PO TABS: 1.0000 mg | ORAL_TABLET | Freq: Two times a day (BID) | ORAL | 1 refills | Status: DC

## 2019-10-02 MED ORDER — HALOPERIDOL 0.5 MG PO TABS: 0.5000 mg | ORAL_TABLET | Freq: Two times a day (BID) | ORAL | 1 refills | Status: DC

## 2019-10-02 MED ORDER — TEMAZEPAM 15 MG PO CAPS: 15.0000 mg | ORAL_CAPSULE | Freq: Every evening | ORAL | 1 refills | Status: DC | PRN

## 2019-10-02 MED ORDER — CITALOPRAM HYDROBROMIDE 10 MG PO TABS: 10.0000 mg | ORAL_TABLET | Freq: Every day | ORAL | 1 refills | Status: DC

## 2019-10-02 NOTE — Progress Notes (Signed)
BH MD/PA/NP OP Progress Note  10/02/2019 3:35 PM Kristine Atkinson  MRN:  [id number]  Chief Complaint: " I am doing fine, I don't want to take any pills.  I just want the shot."  HPI: Patient reported that she is doing well.  She said that she does not want any of the medications and just wants to take the shot every month.  When asked if she tried the temazepam for sleeping she replied that she did not find to be helpful and she does not want to take any other pills for that purpose. She stated that she does not have any frequent crying spells and she feels just fine and month injection is all she will take.  She received her IM Abilify Aristada dose on 09/27/19 last week. Writer spoke to her husband who was waiting for her in the lobby.  Husband informed that she is not sure what she is talking about because she has been taking all her medications regularly.  When asked regarding her sleep, he stated that she sleeps fairly well except for the days when her legs hurt.  She has been recently prescribed some medications for fibromyalgia and pain in her legs and he has informed her that she needs to keep taking them as they may take some time before the start working.  Visit Diagnosis:    ICD-10-CM   1. Schizophrenia, paranoid ([place])  F20.0     Past Psychiatric History: Schizophrenia  Past Medical History:  Past Medical History:  Diagnosis Date  . Cellulitis 06/06/2013  . Chest pain 06/07/2015  . Chronic pain   . Cocaine abuse ([place]) 09/09/2012   Cocaine positive on UDS 09/09/2012.    . Diabetes mellitus   . Elevated serum creatinine 08/14/2013  . Fibromyalgia   . Greater trochanteric bursitis of left hip 05/29/2014  . Hyperlipidemia   . Hypertension   . Syncope 08/14/2013    Past Surgical History:  Procedure Laterality Date  . OOPHORECTOMY     ectopic    Family Psychiatric History: denied  Family History:  Family History  Problem Relation Age of Onset  . Hypertension Mother   .  Cancer Maternal Aunt        colon cancer    Social History:  Social History   Socioeconomic History  . Marital status: Married    Spouse name: Not on file  . Number of children: Not on file  . Years of education: Not on file  . Highest education level: Not on file  Occupational History  . Not on file  Tobacco Use  . Smoking status: Former Smoker    Packs/day: 0.20    Years: 41.00    Pack years: 8.20    Types: Cigarettes    Start date: 01/12/1973    Quit date: 06/13/2015    Years since quitting: 4.3  . Smokeless tobacco: Never Used  . Tobacco comment: smokes about 3-4 cigs per day  as of  02/2014  Substance and Sexual Activity  . Alcohol use: No    Alcohol/week: 0.0 standard drinks  . Drug use: Not on file  . Sexual activity: Not on file  Other Topics Concern  . Not on file  Social History Narrative   Lives with husband Andriana Casa).  Has 3 grown children.  Does not work.  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   Social Determinants of Health   Financial Resource Strain:   . Difficulty of  Paying Living Expenses: Not on file  Food Insecurity:   . Worried About Charity fundraiser in the Last Year: Not on file  . Ran Out of Food in the Last Year: Not on file  Transportation Needs:   . Lack of Transportation (Medical): Not on file  . Lack of Transportation (Non-Medical): Not on file  Physical Activity:   . Days of Exercise per Week: Not on file  . Minutes of Exercise per Session: Not on file  Stress:   . Feeling of Stress : Not on file  Social Connections:   . Frequency of Communication with Friends and Family: Not on file  . Frequency of Social Gatherings with Friends and Family: Not on file  . Attends Religious Services: Not on file  . Active Member of Clubs or Organizations: Not on file  . Attends Archivist Meetings: Not on file  . Marital Status: Not on file    Allergies:  Allergies  Allergen Reactions  .  Penicillins Itching    Did it involve swelling of the face/tongue/throat, SOB, or low BP? Unknown Did it involve sudden or severe rash/hives, skin peeling, or any reaction on the inside of your mouth or nose? Unknown Did you need to seek medical attention at a hospital or doctor's office? Unknown When did it last happen?Unknown If all above answers are "NO", may proceed with cephalosporin use.    Metabolic Disorder Labs: Lab Results  Component Value Date   HGBA1C 6.6 (H) 06/16/2019   No results found for: PROLACTIN Lab Results  Component Value Date   CHOL 146 06/16/2019   TRIG 118 06/16/2019   HDL 44 06/16/2019   CHOLHDL 4.8 08/27/2014   VLDL 26 08/27/2014   LDLCALC 81 06/16/2019   LDLCALC 50 10/14/2018   Lab Results  Component Value Date   TSH 0.667 03/02/2012   TSH 1.74 05/13/2011    Therapeutic Level Labs: No results found for: LITHIUM No results found for: VALPROATE No components found for:  CBMZ  Current Medications: Current Outpatient Medications  Medication Sig Dispense Refill  . AgaMatrix Ultra-Thin Lancets MISC Check blood glucose twice daily before meals 100 each 11  . ARIPiprazole Lauroxil ER (ARISTADA) 441 MG/1.6ML prefilled syringe Inject 441 mg into the muscle every 30 (thirty) days. 1.5 mL 11  . aspirin 81 MG tablet Take 1 tablet (81 mg total) by mouth daily. 100 tablet 1  . benztropine (COGENTIN) 1 MG tablet Take 1 tablet (1 mg total) by mouth 2 (two) times daily. 60 tablet 1  . carvedilol (COREG) 3.125 MG tablet TAKE 1 TABLET (3.125 MG TOTAL) BY MOUTH 2 (TWO) TIMES DAILY. 60 tablet 11  . citalopram (CELEXA) 10 MG tablet Take 1 tablet (10 mg total) by mouth daily. 30 tablet 1  . cyclobenzaprine (FLEXERIL) 10 MG tablet TAKE 1 TABLET BY MOUTH THREE TIMES A DAY AS NEEDED FOR MUSCLE SPASM 30 tablet 2  . glucose blood (AGAMATRIX PRESTO TEST) test strip Check blood glucose twice daily before meals 100 each 11  . haloperidol (HALDOL) 0.5 MG tablet Take 1 tablet  (0.5 mg total) by mouth 2 (two) times daily. 60 tablet 1  . hydrochlorothiazide (HYDRODIURIL) 25 MG tablet TAKE 1 TABLET BY MOUTH DAILY 30 tablet 11  . losartan (COZAAR) 50 MG tablet TAKE 1 TABLET (50 MG TOTAL) BY MOUTH DAILY. 30 tablet 11  . meloxicam (MOBIC) 15 MG tablet TAKE 1 TABLET BY MOUTH DAILY WITH FOOD FOR LEG PAIN (Patient taking differently:  Take 15 mg by mouth daily. ) 30 tablet 11  . metFORMIN (GLUCOPHAGE-XR) 500 MG 24 hr tablet TAKE 1 TABLET BY MOUTH TWICE A DAY WITH MEALS 60 tablet 11  . omeprazole (PRILOSEC) 20 MG capsule TAKE 1 CAPSULE BY MOUTH TWICE A DAY 30 capsule 5  . pregabalin (LYRICA) 50 MG capsule Take 1 capsule (50 mg total) by mouth 3 (three) times daily. 270 capsule 3  . rosuvastatin (CRESTOR) 20 MG tablet TAKE 1 TABLET BY MOUTH DAILY WITH EVENING MEAL 30 tablet 11  . temazepam (RESTORIL) 15 MG capsule Take 1 capsule (15 mg total) by mouth at bedtime as needed for sleep. 30 capsule 1   No current facility-administered medications for this visit.     Musculoskeletal: Strength & Muscle Tone: within normal limits Gait & Station: normal Patient leans: N/A  Psychiatric Specialty Exam: Review of Systems  There were no vitals taken for this visit.There is no height or weight on file to calculate BMI.  General Appearance: Fairly Groomed  Eye Contact:  Good  Speech:  Clear and Coherent and Normal Rate  Volume:  Normal  Mood:  Euthymic  Affect:  Congruent  Thought Process:  Goal Directed and Descriptions of Associations: Intact  Orientation:  Full (Time, Place, and Person)  Thought Content: Illogical and Rumination   Suicidal Thoughts:  No  Homicidal Thoughts:  No  Memory:  Immediate;   Good Recent;   Good  Judgement:  Fair  Insight:  Fair  Psychomotor Activity:  Restlessness  Concentration:  Concentration: Good and Attention Span: Good  Recall:  Good  Fund of Knowledge: Good  Language: Good  Akathisia:  Negative  Handed:  Right  AIMS (if indicated): not  done  Assets:  Communication Skills Desire for Improvement Financial Resources/Insurance Housing Social Support  ADL's:  Intact  Cognition: WNL  Sleep:  Poor   Screenings: PHQ2-9     Office Visit from 12/27/2015 in [place] Office Visit from 09/05/2015 in [place] Office Visit from 06/28/2015 in [place] Office Visit from 06/07/2015 in [place] Office Visit from 03/22/2015 in [place] [place]  PHQ-2 Total Score 0 0 0 0 0  PHQ-9 Total Score 0 0 -- -- --       Assessment and Plan: Patient stated that she did not take any medications by mouth and decided to keep a month injection however her husband stated that she has been taking her medications as prescribed.  1. Schizophrenia, paranoid ([place])  - ARIPiprazole Lauroxil ER (ARISTADA) 441 MG/1.6ML prefilled syringe 441 mg. Dose administered on 09/27/19. - benztropine (COGENTIN) 1 MG tablet; Take 1 tablet (1 mg total) by mouth 2 (two) times daily.  Dispense: 60 tablet; Refill: 1 - citalopram (CELEXA) 10 MG tablet; Take 1 tablet (10 mg total) by mouth daily.  Dispense: 30 tablet; Refill: 1 - haloperidol (HALDOL) 0.5 MG tablet; Take 1 tablet (0.5 mg total) by mouth 2 (two) times daily.  Dispense: 60 tablet; Refill: 1 - temazepam (RESTORIL) 15 MG capsule; Take 1 capsule (15 mg total) by mouth at bedtime as needed for sleep.  Dispense: 30 capsule; Refill: 1  Continue same regimen. F/up in 2 months.   [place] Crane, MD 10/02/2019, 3:35 PM

## 2019-10-03 ENCOUNTER — Other Ambulatory Visit: Payer: Self-pay | Admitting: Internal Medicine

## 2019-10-03 ENCOUNTER — Ambulatory Visit: Payer: Self-pay | Admitting: Internal Medicine

## 2019-10-03 ENCOUNTER — Encounter: Payer: Self-pay | Admitting: Internal Medicine

## 2019-10-03 VITALS — BP 128/84 | HR 70 | Resp 12 | Ht 61.5 in | Wt 200.0 lb

## 2019-10-03 DIAGNOSIS — Z0000 Encounter for general adult medical examination without abnormal findings: Secondary | ICD-10-CM

## 2019-10-03 DIAGNOSIS — E669 Obesity, unspecified: Secondary | ICD-10-CM

## 2019-10-03 DIAGNOSIS — E785 Hyperlipidemia, unspecified: Secondary | ICD-10-CM

## 2019-10-03 DIAGNOSIS — Z124 Encounter for screening for malignant neoplasm of cervix: Secondary | ICD-10-CM

## 2019-10-03 DIAGNOSIS — I10 Essential (primary) hypertension: Secondary | ICD-10-CM

## 2019-10-03 DIAGNOSIS — E1142 Type 2 diabetes mellitus with diabetic polyneuropathy: Secondary | ICD-10-CM

## 2019-10-03 DIAGNOSIS — M797 Fibromyalgia: Secondary | ICD-10-CM

## 2019-10-03 DIAGNOSIS — Z1231 Encounter for screening mammogram for malignant neoplasm of breast: Secondary | ICD-10-CM

## 2019-10-03 LAB — POCT WET PREP WITH KOH
Clue Cells Wet Prep HPF POC: NEGATIVE
KOH Prep POC: NEGATIVE
RBC Wet Prep HPF POC: NEGATIVE
Trichomonas, UA: NEGATIVE
Yeast Wet Prep HPF POC: NEGATIVE

## 2019-10-03 NOTE — Progress Notes (Signed)
Subjective:    Patient ID: Kristine Atkinson, female   DOB: [birth year], 64 y.o.   MRN: [id number]   HPI   CPE with pap  1.  Pap:  Last pap was in 2017.  Normal.  Never abnormal per patient.  2.  Mammogram:  Not clear why she has not gone for her mammogram since 2015.  Always normal.  Her niece with a history of breast cancer.  3.  Osteoprevention:  Does not drink much milk or eat dairy daily.  Cannot say if she takes calcium or vitamin D.  4.  Guaiac Cards:  Last guaiac cards performed 10/2011, all of which were positive for occult blood.    5.  Colonoscopy:  Performed 01/2011 and normal, though slightly suboptimal prep  6.  Immunizations:   Immunization History  Administered Date(s) Administered   Influenza Inj Mdck Quad Pf 11/23/2016, 11/25/2018   Influenza,inj,Quad PF,6+ Mos 09/30/2012, 10/23/2013, 11/16/2014, 09/26/2015   Moderna SARS-COVID-2 Vaccination 04/06/2019, 05/09/2019   Pneumococcal Polysaccharide-23 07/14/2011, 02/12/2019   Tdap 07/14/2011     7.  Glucose/Cholesterol:  Diabetic control fine in June with A1C of 6.6% and cholesterol panel almost at goal though LDL a bit high at 81.  8.  Mental Health:  Not clear if she is taking oral meds or not.  At her visit with Behavioral health yesterday,  She stated she did not want to continue any of her meds but her injected Aripiprazole.  Her husband stated she was taking all of her meds.  She insists she is not taking the meds, particularly the Temazepam.  It is listed as to be taken in the note.  Current Meds  Medication Sig   ARIPiprazole Lauroxil ER (ARISTADA) 441 MG/1.6ML prefilled syringe Inject 441 mg into the muscle every 30 (thirty) days.   aspirin 81 MG tablet Take 1 tablet (81 mg total) by mouth daily.   carvedilol (COREG) 3.125 MG tablet TAKE 1 TABLET (3.125 MG TOTAL) BY MOUTH 2 (TWO) TIMES DAILY.   cyclobenzaprine (FLEXERIL) 10 MG tablet TAKE 1 TABLET BY MOUTH THREE TIMES A DAY AS NEEDED FOR MUSCLE SPASM    hydrochlorothiazide (HYDRODIURIL) 25 MG tablet TAKE 1 TABLET BY MOUTH DAILY   losartan (COZAAR) 50 MG tablet TAKE 1 TABLET (50 MG TOTAL) BY MOUTH DAILY.   meloxicam (MOBIC) 15 MG tablet TAKE 1 TABLET BY MOUTH DAILY WITH FOOD FOR LEG PAIN (Patient taking differently: Take 15 mg by mouth daily. )   metFORMIN (GLUCOPHAGE-XR) 500 MG 24 hr tablet TAKE 1 TABLET BY MOUTH TWICE A DAY WITH MEALS   omeprazole (PRILOSEC) 20 MG capsule TAKE 1 CAPSULE BY MOUTH TWICE A DAY   pregabalin (LYRICA) 50 MG capsule Take 1 capsule (50 mg total) by mouth 3 (three) times daily.   rosuvastatin (CRESTOR) 20 MG tablet TAKE 1 TABLET BY MOUTH DAILY WITH EVENING MEAL   Allergies  Allergen Reactions   Penicillins Itching    Did it involve swelling of the face/tongue/throat, SOB, or low BP? Unknown Did it involve sudden or severe rash/hives, skin peeling, or any reaction on the inside of your mouth or nose? Unknown Did you need to seek medical attention at a hospital or doctor's office? Unknown When did it last happen? Unknown If all above answers are "NO", may proceed with cephalosporin use.   Past Medical History:  Diagnosis Date   Cellulitis 06/06/2013   Chest pain 06/07/2015   Chronic pain    Cocaine abuse ([place]) 09/09/2012  Cocaine positive on UDS 09/09/2012.     Diabetes mellitus    Elevated serum creatinine 08/14/2013   Fibromyalgia    Greater trochanteric bursitis of left hip 05/29/2014   Hyperlipidemia    Hypertension    Pneumonia due to COVID-19 virus 02/11/2019   Hospitalized with hypoxia, but did not require intubation/ventilation.   Schizophrenia ([place])    Syncope 08/14/2013     Past Surgical History:  Procedure Laterality Date   OOPHORECTOMY     ectopic   Family History  Problem Relation Age of Onset   Hypertension Mother    Cancer Maternal Aunt        colon cancer   Family Status  Relation Name Status   Mother  Alive   Father unknown Deceased   Sister  Alive   Sister  Armed forces operational officer  (Not Specified)   Social History   Socioeconomic History   Marital status: Married    Spouse name: Not on file   Number of children: Not on file   Years of education: Not on file   Highest education level: Not on file  Occupational History   Not on file  Tobacco Use   Smoking status: Former    Packs/day: 0.20    Years: 41.00    Pack years: 8.20    Types: Cigarettes    Start date: 01/12/1973    Quit date: 06/13/2015    Years since quitting: 5.1   Smokeless tobacco: Never   Tobacco comments:    smokes about 3-4 cigs per day  as of  02/2014  Substance and Sexual Activity   Alcohol use: No    Alcohol/week: 0.0 standard drinks   Drug use: Not on file   Sexual activity: Not on file  Other Topics Concern   Not on file  Social History Narrative   Lives with husband Kristine Atkinson).  Has 3 grown children.  Does not work.  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   Social Determinants of Health   Financial Resource Strain: Not on file  Food Insecurity: No Food Insecurity   Worried About Charity fundraiser in the Last Year: Never true   Ran Out of Food in the Last Year: Never true  Transportation Needs: No Transportation Needs   Lack of Transportation (Medical): No   Lack of Transportation (Non-Medical): No  Physical Activity: Not on file  Stress: Not on file  Social Connections: Not on file  Intimate Partner Violence: Not on file      Review of Systems  Constitutional: Negative for appetite change, fatigue and unexpected weight change.  HENT: Positive for hearing loss. Negative for sore throat.   Eyes: Negative for visual disturbance.  Respiratory: Positive for cough (Every now and then.). Negative for shortness of breath.   Cardiovascular: Negative for chest pain and palpitations.  Gastrointestinal: Negative for abdominal pain, blood in stool, constipation and diarrhea.  Genitourinary: Negative for dysuria and vaginal discharge.   Musculoskeletal: Negative for arthralgias and joint swelling. Back pain: Legs feel like a toothache.  More so when on them.  Neurological: Negative for speech difficulty and light-headedness.  Psychiatric/Behavioral: Negative for dysphoric mood, hallucinations and suicidal ideas. The patient is not nervous/anxious.       Objective:   BP 128/84 (BP Location: Left Arm, Patient Position: Sitting, Cuff Size: Large)   Pulse 70   Resp 12   Ht 5' 1.5" (1.562 m)   Wt 200  lb (90.7 kg)   BMI 37.18 kg/m   Physical Exam Constitutional:      Appearance: She is obese.  HENT:     Head: Normocephalic and atraumatic.     Right Ear: Tympanic membrane, ear canal and external ear normal.     Left Ear: Tympanic membrane, ear canal and external ear normal.     Nose: Nose normal.     Mouth/Throat:     Mouth: Mucous membranes are moist.     Pharynx: Oropharynx is clear.  Eyes:     Extraocular Movements: Extraocular movements intact.     Conjunctiva/sclera: Conjunctivae normal.     Pupils: Pupils are equal, round, and reactive to light.     Comments: Discs sharp bilaterally.  Neck:     Thyroid: No thyroid mass or thyromegaly.  Cardiovascular:     Rate and Rhythm: Normal rate and regular rhythm.     Pulses:          Dorsalis pedis pulses are 2+ on the right side and 2+ on the left side.     Heart sounds: S1 normal and S2 normal. No murmur heard.   No friction rub. No S3 or S4 sounds.     Comments: No carotid bruits.  Carotid, radial, femoral, DP and PT pulses normal and equal.    Pulmonary:     Effort: Pulmonary effort is normal.     Breath sounds: Normal breath sounds.  Chest:  Breasts:    Right: No mass, nipple discharge, skin change, tenderness, axillary adenopathy or supraclavicular adenopathy.     Left: No mass, nipple discharge, skin change, tenderness, axillary adenopathy or supraclavicular adenopathy.  Abdominal:     General: Bowel sounds are normal.     Palpations: Abdomen is  soft. There is no hepatomegaly, splenomegaly or mass.     Tenderness: There is no abdominal tenderness.     Hernia: No hernia is present.  Genitourinary:    Comments: Normal external female genitalia. No cervical lesions No uterine or adnexal area mass or tenderness. Musculoskeletal:        General: Normal range of motion.     Cervical back: Normal range of motion and neck supple.  Feet:     Right foot:     Protective Sensation: 10 sites tested.  10 sites sensed.     Skin integrity: No ulcer or skin breakdown.     Left foot:     Protective Sensation: 10 sites tested.  10 sites sensed.     Skin integrity: No ulcer or skin breakdown.  Lymphadenopathy:     Head:     Right side of head: No submental or submandibular adenopathy.     Left side of head: No submental or submandibular adenopathy.     Cervical: No cervical adenopathy.     Upper Body:     Right upper body: No supraclavicular or axillary adenopathy.     Left upper body: No supraclavicular or axillary adenopathy.     Lower Body: No right inguinal adenopathy. No left inguinal adenopathy.  Skin:    General: Skin is warm.     Capillary Refill: Capillary refill takes less than 2 seconds.     Findings: No rash.  Neurological:     Mental Status: She is alert.     Cranial Nerves: Cranial nerves are intact.     Sensory: Sensation is intact.     Motor: Motor function is intact.     Coordination: Coordination is intact.  Gait: Gait is intact.     Deep Tendon Reflexes: Reflexes are normal and symmetric.  Psychiatric:        Attention and Perception: Attention normal.        Mood and Affect: Affect is labile.        Speech: Speech normal.        Behavior: Behavior normal. Behavior is cooperative.     Assessment & Plan   1.  CPE with pap Schedule mammogram Immunizations up to date Flu vaccine here next week.  2.  DM:  Labs in 2 days fasting.  Referral for diabetic eye exam.    3.  Hypertension:  controlled  4.   Hyperlipidemia:  labs pending in 2 d.  5.  Obesity:  encouraged her to make small goals on a weekly basis that improve diet and daily physical activity.  6.  Fibromyalgia:  continue Lyrica and muscle relaxant.

## 2019-10-03 NOTE — Patient Instructions (Signed)
Mr. Kristine Atkinson:  For the eye exam at [facility]--it costs the same as when I refer through the orange card, but a person can get two sets of glasses instead of just one and they get them at the same place--not a different eyeglass business as would happen with the orange card. Mrs. Kristine Atkinson:  You would need to call on your own for an appt to the phone number on the flyer.  Also--Mrs. Kristine Atkinson--to work on diet as below:  Drink a glass of water before every meal Drink 6-8 glasses of water daily Eat three meals daily Eat a protein and healthy fat with every meal (eggs,fish, chicken, [place] and limit red meats) Eat 5 servings of vegetables daily, mix the colors Eat 2 servings of fruit daily with skin, if skin is edible Use smaller plates Put food/utensils down as you chew and swallow each bite Eat at a table with friends/family at least once daily, no TV Do not eat in front of the TV  Recent studies show that people who consume all of their calories in a 12 hour period lose weight more efficiently.  For example, if you eat your first meal at 7:00 a.m., your last meal of the day should be completed by 7:00 p.m.

## 2019-10-04 LAB — CYTOLOGY - PAP

## 2019-10-05 ENCOUNTER — Other Ambulatory Visit: Payer: Self-pay

## 2019-10-05 DIAGNOSIS — Z1211 Encounter for screening for malignant neoplasm of colon: Secondary | ICD-10-CM

## 2019-10-05 DIAGNOSIS — E1142 Type 2 diabetes mellitus with diabetic polyneuropathy: Secondary | ICD-10-CM

## 2019-10-05 DIAGNOSIS — Z79899 Other long term (current) drug therapy: Secondary | ICD-10-CM

## 2019-10-05 DIAGNOSIS — E785 Hyperlipidemia, unspecified: Secondary | ICD-10-CM

## 2019-10-05 LAB — POC HEMOCCULT BLD/STL (HOME/3-CARD/SCREEN)
Card #2 Fecal Occult Blod, POC: NEGATIVE
Card #3 Fecal Occult Blood, POC: NEGATIVE
Fecal Occult Blood, POC: NEGATIVE

## 2019-10-06 LAB — LIPID PANEL W/O CHOL/HDL RATIO
Cholesterol, Total: 138 mg/dL (ref 100–199)
HDL: 39 mg/dL — ABNORMAL LOW (ref >39)
LDL Chol Calc (NIH): 65 mg/dL (ref 0–99)
Triglycerides: 207 mg/dL — ABNORMAL HIGH (ref 0–149)
VLDL Cholesterol Cal: 34 mg/dL (ref 5–40)

## 2019-10-06 LAB — MICROALBUMIN / CREATININE URINE RATIO
Creatinine, Urine: 109.4 mg/dL
Microalb/Creat Ratio: 4 mg/g creat (ref 0–29)
Microalbumin, Urine: 4.5 ug/mL

## 2019-10-06 LAB — CBC WITH DIFFERENTIAL/PLATELET
Basophils Absolute: 0.1 10*3/uL (ref 0.0–0.2)
Basos: 1 %
EOS (ABSOLUTE): 0.1 10*3/uL (ref 0.0–0.4)
Eos: 1 %
Hematocrit: 42 % (ref 34.0–46.6)
Hemoglobin: 13.7 g/dL (ref 11.1–15.9)
Immature Grans (Abs): 0 10*3/uL (ref 0.0–0.1)
Immature Granulocytes: 0 %
Lymphocytes Absolute: 3.4 10*3/uL — ABNORMAL HIGH (ref 0.7–3.1)
Lymphs: 36 %
MCH: 29.5 pg (ref 26.6–33.0)
MCHC: 32.6 g/dL (ref 31.5–35.7)
MCV: 91 fL (ref 79–97)
Monocytes Absolute: 0.7 10*3/uL (ref 0.1–0.9)
Monocytes: 7 %
Neutrophils Absolute: 5.1 10*3/uL (ref 1.4–7.0)
Neutrophils: 55 %
Platelets: 355 10*3/uL (ref 150–450)
RBC: 4.64 x10E6/uL (ref 3.77–5.28)
RDW: 14.9 % (ref 11.7–15.4)
WBC: 9.4 10*3/uL (ref 3.4–10.8)

## 2019-10-06 LAB — COMPREHENSIVE METABOLIC PANEL
ALT: 19 IU/L (ref 0–32)
AST: 20 IU/L (ref 0–40)
Albumin/Globulin Ratio: 1.6 (ref 1.2–2.2)
Albumin: 4.2 g/dL (ref 3.8–4.8)
Alkaline Phosphatase: 108 IU/L (ref 44–121)
BUN/Creatinine Ratio: 13 (ref 12–28)
BUN: 15 mg/dL (ref 8–27)
Bilirubin Total: <0.2 mg/dL (ref 0.0–1.2)
CO2: 27 mmol/L (ref 20–29)
Calcium: 9.6 mg/dL (ref 8.7–10.3)
Chloride: 100 mmol/L (ref 96–106)
Creatinine, Ser: 1.13 mg/dL — ABNORMAL HIGH (ref 0.57–1.00)
GFR calc Af Amer: 60 mL/min/{1.73_m2} (ref >59)
GFR calc non Af Amer: 52 mL/min/{1.73_m2} — ABNORMAL LOW (ref >59)
Globulin, Total: 2.7 g/dL (ref 1.5–4.5)
Glucose: 127 mg/dL — ABNORMAL HIGH (ref 65–99)
Potassium: 4.3 mmol/L (ref 3.5–5.2)
Sodium: 140 mmol/L (ref 134–144)
Total Protein: 6.9 g/dL (ref 6.0–8.5)

## 2019-10-06 LAB — HGB A1C W/O EAG: Hgb A1c MFr Bld: 6.4 % — ABNORMAL HIGH (ref 4.8–5.6)

## 2019-10-12 ENCOUNTER — Telehealth: Payer: Self-pay | Admitting: Internal Medicine

## 2019-10-12 NOTE — Telephone Encounter (Signed)
Kristine Atkinson; pt's husband called requesting a letter from Dr. Amil Amen to discontinue  Oxygen machine and air thank from home to be faxed over [name] , [name], [name], [name] [address] ph# [phone]  Called the facility to get more  Information on what needs to be stated on the letter or if there is a form that  can be faxed over to [place]  for Dr. Amil Amen to fill out and fax it back to them. Unable to speak with somebody at [facility] - on hold for 35 minutes.

## 2019-10-13 ENCOUNTER — Encounter: Payer: Self-pay | Admitting: Internal Medicine

## 2019-10-19 NOTE — Telephone Encounter (Signed)
Spoke with Kristine Atkinson to informed letter from Dr. Amil Amen to discontinue oxygen machine and air tank pick up was faxed at [phone] on 10/19/2019 @ 3:23 pm

## 2019-10-25 ENCOUNTER — Other Ambulatory Visit: Payer: Self-pay

## 2019-10-25 ENCOUNTER — Encounter (HOSPITAL_COMMUNITY): Payer: Self-pay

## 2019-10-25 ENCOUNTER — Ambulatory Visit (INDEPENDENT_AMBULATORY_CARE_PROVIDER_SITE_OTHER): Payer: No Payment, Other | Admitting: *Deleted

## 2019-10-25 VITALS — BP 134/76 | HR 95 | Ht 63.0 in | Wt 206.0 lb

## 2019-10-25 DIAGNOSIS — F200 Paranoid schizophrenia: Secondary | ICD-10-CM

## 2019-10-25 MED ORDER — ARIPIPRAZOLE LAUROXIL ER 441 MG/1.6ML IM PRSY
441.0000 mg | PREFILLED_SYRINGE | INTRAMUSCULAR | Status: AC
  Administered 2019-10-25 – 2020-08-27 (×10): 441 mg via INTRAMUSCULAR

## 2019-10-25 NOTE — Progress Notes (Signed)
Patient arrived today for ARIPiprazole Lauroxil ER (ARISTADA) 441 MG/1.6ML prefilled syringe. Patient pleasant as always. Injection tolerated well in Left-Arm.

## 2019-11-16 ENCOUNTER — Ambulatory Visit: Payer: No Typology Code available for payment source | Attending: Internal Medicine

## 2019-11-16 DIAGNOSIS — Z23 Encounter for immunization: Secondary | ICD-10-CM

## 2019-11-16 NOTE — Progress Notes (Signed)
   Covid-19 Vaccination Clinic  Name:  Kristine Atkinson    MRN: [id number] DOB: 1955-06-13  11/16/2019  [name] was observed post Covid-19 immunization for 15 minutes without incident. She was provided with Vaccine Information Sheet and instruction to access the V-Safe system.   [name] was instructed to call 911 with any severe reactions post vaccine: Marland Kitchen Difficulty breathing  . Swelling of face and throat  . A fast heartbeat  . A bad rash all over body  . Dizziness and weakness

## 2019-11-22 ENCOUNTER — Encounter (HOSPITAL_COMMUNITY): Payer: Self-pay | Admitting: Psychiatry

## 2019-11-22 ENCOUNTER — Other Ambulatory Visit: Payer: Self-pay

## 2019-11-22 ENCOUNTER — Ambulatory Visit (INDEPENDENT_AMBULATORY_CARE_PROVIDER_SITE_OTHER): Payer: No Payment, Other | Admitting: Psychiatry

## 2019-11-22 ENCOUNTER — Ambulatory Visit (HOSPITAL_COMMUNITY): Payer: No Payment, Other | Admitting: *Deleted

## 2019-11-22 ENCOUNTER — Encounter (HOSPITAL_COMMUNITY): Payer: Self-pay

## 2019-11-22 VITALS — BP 156/77 | HR 102 | Ht 63.0 in | Wt 205.0 lb

## 2019-11-22 DIAGNOSIS — F200 Paranoid schizophrenia: Secondary | ICD-10-CM | POA: Diagnosis not present

## 2019-11-22 MED ORDER — HALOPERIDOL 0.5 MG PO TABS: 0.5000 mg | ORAL_TABLET | Freq: Two times a day (BID) | ORAL | 1 refills | Status: DC

## 2019-11-22 MED ORDER — CITALOPRAM HYDROBROMIDE 10 MG PO TABS: 10.0000 mg | ORAL_TABLET | Freq: Every day | ORAL | 1 refills | Status: DC

## 2019-11-22 MED ORDER — TEMAZEPAM 15 MG PO CAPS: 15.0000 mg | ORAL_CAPSULE | Freq: Every evening | ORAL | 1 refills | Status: DC | PRN

## 2019-11-22 MED ORDER — BENZTROPINE MESYLATE 1 MG PO TABS: 1.0000 mg | ORAL_TABLET | Freq: Two times a day (BID) | ORAL | 1 refills | Status: DC

## 2019-11-22 NOTE — Progress Notes (Signed)
BH MD/PA/NP OP Progress Note  11/22/2019 2:51 PM Kristine Atkinson  MRN:  [id number]  Chief Complaint: " I am still not sleeping due to the pain in my legs."  HPI: She is doing well however she still has poor sleep at night. She stated that she has tried some different medicines but nothing helps. She stated that she stays up mainly because of the pain in her legs. She stated that she believes the pain is due to Metformin however when she asks her PCP to change the Metformin to something else they refused to do so. She was unable to elaborate why they do that. When asked regarding her plans for the holidays, she stated that the whole family is going to [place] but she is not going. She stated that she does not want to go because of pain in her legs.  Visit Diagnosis:    ICD-10-CM   1. Schizophrenia, paranoid ([place])  F20.0 temazepam (RESTORIL) 15 MG capsule    haloperidol (HALDOL) 0.5 MG tablet    benztropine (COGENTIN) 1 MG tablet    citalopram (CELEXA) 10 MG tablet    Past Psychiatric History: Schizophrenia  Past Medical History:  Past Medical History:  Diagnosis Date  . Cellulitis 06/06/2013  . Chest pain 06/07/2015  . Chronic pain   . Cocaine abuse ([place]) 09/09/2012   Cocaine positive on UDS 09/09/2012.    . Diabetes mellitus   . Elevated serum creatinine 08/14/2013  . Fibromyalgia   . Greater trochanteric bursitis of left hip 05/29/2014  . Hyperlipidemia   . Hypertension   . Syncope 08/14/2013    Past Surgical History:  Procedure Laterality Date  . OOPHORECTOMY     ectopic    Family Psychiatric History: denied  Family History:  Family History  Problem Relation Age of Onset  . Hypertension Mother   . Cancer Maternal Aunt        colon cancer    Social History:  Social History   Socioeconomic History  . Marital status: Married    Spouse name: Not on file  . Number of children: Not on file  . Years of education: Not on file  . Highest education level: Not on  file  Occupational History  . Not on file  Tobacco Use  . Smoking status: Former Smoker    Packs/day: 0.20    Years: 41.00    Pack years: 8.20    Types: Cigarettes    Start date: 01/12/1973    Quit date: 06/13/2015    Years since quitting: 4.4  . Smokeless tobacco: Never Used  . Tobacco comment: smokes about 3-4 cigs per day  as of  02/2014  Substance and Sexual Activity  . Alcohol use: No    Alcohol/week: 0.0 standard drinks  . Drug use: Not on file  . Sexual activity: Not on file  Other Topics Concern  . Not on file  Social History Narrative   Lives with husband Conner Neiss).  Has 3 grown children.  Does not work.  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   Social Determinants of Health   Financial Resource Strain:   . Difficulty of Paying Living Expenses: Not on file  Food Insecurity: No Food Insecurity  . Worried About Charity fundraiser in the Last Year: Never true  . Ran Out of Food in the Last Year: Never true  Transportation Needs: No Transportation Needs  . Lack of Transportation (Medical): No  .  Lack of Transportation (Non-Medical): No  Physical Activity:   . Days of Exercise per Week: Not on file  . Minutes of Exercise per Session: Not on file  Stress:   . Feeling of Stress : Not on file  Social Connections:   . Frequency of Communication with Friends and Family: Not on file  . Frequency of Social Gatherings with Friends and Family: Not on file  . Attends Religious Services: Not on file  . Active Member of Clubs or Organizations: Not on file  . Attends Archivist Meetings: Not on file  . Marital Status: Not on file    Allergies:  Allergies  Allergen Reactions  . Penicillins Itching    Did it involve swelling of the face/tongue/throat, SOB, or low BP? Unknown Did it involve sudden or severe rash/hives, skin peeling, or any reaction on the inside of your mouth or nose? Unknown Did you need to seek medical attention  at a hospital or doctor's office? Unknown When did it last happen?Unknown If all above answers are "NO", may proceed with cephalosporin use.    Metabolic Disorder Labs: Lab Results  Component Value Date   HGBA1C 6.4 (H) 10/05/2019   No results found for: PROLACTIN Lab Results  Component Value Date   CHOL 138 10/05/2019   TRIG 207 (H) 10/05/2019   HDL 39 (L) 10/05/2019   CHOLHDL 4.8 08/27/2014   VLDL 26 08/27/2014   LDLCALC 65 10/05/2019   LDLCALC 81 06/16/2019   Lab Results  Component Value Date   TSH 0.667 03/02/2012   TSH 1.74 05/13/2011    Therapeutic Level Labs: No results found for: LITHIUM No results found for: VALPROATE No components found for:  CBMZ  Current Medications: Current Outpatient Medications  Medication Sig Dispense Refill  . AgaMatrix Ultra-Thin Lancets MISC Check blood glucose twice daily before meals 100 each 11  . aspirin 81 MG tablet Take 1 tablet (81 mg total) by mouth daily. 100 tablet 1  . benztropine (COGENTIN) 1 MG tablet Take 1 tablet (1 mg total) by mouth 2 (two) times daily. 60 tablet 1  . carvedilol (COREG) 3.125 MG tablet TAKE 1 TABLET (3.125 MG TOTAL) BY MOUTH 2 (TWO) TIMES DAILY. 60 tablet 11  . citalopram (CELEXA) 10 MG tablet Take 1 tablet (10 mg total) by mouth daily. 30 tablet 1  . cyclobenzaprine (FLEXERIL) 10 MG tablet TAKE 1 TABLET BY MOUTH THREE TIMES A DAY AS NEEDED FOR MUSCLE SPASM 30 tablet 2  . glucose blood (AGAMATRIX PRESTO TEST) test strip Check blood glucose twice daily before meals 100 each 11  . haloperidol (HALDOL) 0.5 MG tablet Take 1 tablet (0.5 mg total) by mouth 2 (two) times daily. 60 tablet 1  . hydrochlorothiazide (HYDRODIURIL) 25 MG tablet TAKE 1 TABLET BY MOUTH DAILY 30 tablet 11  . losartan (COZAAR) 50 MG tablet TAKE 1 TABLET (50 MG TOTAL) BY MOUTH DAILY. 30 tablet 11  . meloxicam (MOBIC) 15 MG tablet TAKE 1 TABLET BY MOUTH DAILY WITH FOOD FOR LEG PAIN (Patient taking differently: Take 15 mg by mouth daily.  ) 30 tablet 11  . metFORMIN (GLUCOPHAGE-XR) 500 MG 24 hr tablet TAKE 1 TABLET BY MOUTH TWICE A DAY WITH MEALS 60 tablet 11  . omeprazole (PRILOSEC) 20 MG capsule TAKE 1 CAPSULE BY MOUTH TWICE A DAY 30 capsule 5  . pregabalin (LYRICA) 50 MG capsule Take 1 capsule (50 mg total) by mouth 3 (three) times daily. 270 capsule 3  . rosuvastatin (CRESTOR)  20 MG tablet TAKE 1 TABLET BY MOUTH DAILY WITH EVENING MEAL 30 tablet 11  . temazepam (RESTORIL) 15 MG capsule Take 1 capsule (15 mg total) by mouth at bedtime as needed for sleep. 30 capsule 1   Current Facility-Administered Medications  Medication Dose Route Frequency Provider Last Rate Last Admin  . ARIPiprazole Lauroxil ER (ARISTADA) 441 MG/1.6ML prefilled syringe 441 mg  441 mg Intramuscular Q28 days [place] Crane, MD   441 mg at 10/25/19 1500     Musculoskeletal: Strength & Muscle Tone: within normal limits Gait & Station: normal Patient leans: N/A  Psychiatric Specialty Exam: Review of Systems   There were no vitals taken for this visit.There is no height or weight on file to calculate BMI.  General Appearance: Fairly Groomed  Eye Contact:  Good  Speech:  Clear and Coherent and Normal Rate  Volume:  Normal  Mood:  Euthymic  Affect:  Congruent  Thought Process:  Goal Directed and Descriptions of Associations: Intact  Orientation:  Full (Time, Place, and Person)  Thought Content: Illogical and Rumination   Suicidal Thoughts:  No  Homicidal Thoughts:  No  Memory:  Immediate;   Good Recent;   Good  Judgement:  Fair  Insight:  Fair  Psychomotor Activity:  Restlessness  Concentration:  Concentration: Good and Attention Span: Good  Recall:  Good  Fund of Knowledge: Good  Language: Good  Akathisia:  Negative  Handed:  Right  AIMS (if indicated): not done  Assets:  Communication Skills Desire for Improvement Financial Resources/Insurance Housing Social Support  ADL's:  Intact  Cognition: WNL  Sleep:  Poor    Screenings: PHQ2-9     Office Visit from 12/27/2015 in [facility] Office Visit from 09/05/2015 in [facility] Office Visit from 06/28/2015 in [facility] Office Visit from 06/07/2015 in [facility] Office Visit from 03/22/2015 in [facility]  PHQ-2 Total Score 0 0 0 0 0  PHQ-9 Total Score 0 0 -- -- --       Assessment and Plan: Patient appears to be at her baseline. She continues to complain of poor sleep due to chronic pain in her legs. However she is taking Lyrica and she is also been prescribed benzodiazepine to help with sleep which should help with any restlessness in her legs.  1. Schizophrenia, paranoid ([place])  - ARIPiprazole Lauroxil ER (ARISTADA) 441 MG/1.6ML prefilled syringe 441 mg. Dose administered today. - benztropine (COGENTIN) 1 MG tablet; Take 1 tablet (1 mg total) by mouth 2 (two) times daily.  Dispense: 60 tablet; Refill: 1 - citalopram (CELEXA) 10 MG tablet; Take 1 tablet (10 mg total) by mouth daily.  Dispense: 30 tablet; Refill: 1 - haloperidol (HALDOL) 0.5 MG tablet; Take 1 tablet (0.5 mg total) by mouth 2 (two) times daily.  Dispense: 60 tablet; Refill: 1 - temazepam (RESTORIL) 15 MG capsule; Take 1 capsule (15 mg total) by mouth at bedtime as needed for sleep.  Dispense: 30 capsule; Refill: 1  Continue same regimen. F/up in 2 months.   [place] Crane, MD 11/22/2019, 2:51 PM

## 2019-11-22 NOTE — Progress Notes (Signed)
In as scheduled to see Dr Toy Care and also get her monthly injection. She was quite animated today and displayed a good sense of humor. She was appropriate and denied any sx of her illness. Today she received her Aristada 441 mg injection in her R arm since she said she got her COVID booster recently in her L arm. To return in one month for her next injection  And in two months for an assessment with Dr Toy Care.

## 2019-12-20 ENCOUNTER — Other Ambulatory Visit: Payer: Self-pay

## 2019-12-20 ENCOUNTER — Ambulatory Visit (HOSPITAL_COMMUNITY): Payer: No Payment, Other | Admitting: *Deleted

## 2019-12-20 ENCOUNTER — Other Ambulatory Visit: Payer: Self-pay | Admitting: Internal Medicine

## 2019-12-20 ENCOUNTER — Encounter (HOSPITAL_COMMUNITY): Payer: Self-pay

## 2019-12-20 VITALS — BP 153/82 | HR 102 | Ht 63.0 in | Wt 213.0 lb

## 2019-12-20 DIAGNOSIS — F200 Paranoid schizophrenia: Secondary | ICD-10-CM

## 2019-12-20 NOTE — Progress Notes (Signed)
In for her monthly injection of Aristada 441 mg. Given today in her R deltoid. She is in good spirits, displayed humor, stating she is walking slow because she just came from [place] and she had a cheeseburger with bacon on it. She denies any psychotic sx. No complaints voiced. Her husband brought her as always to her appt but waits for her in the lobby. States she did not go with him to his moms for Thanksgiving because she doesn't like her, she states she has always picked at her and questioned her a lot and for that reason she doesn't like her. To return in one month for next injection and on 1/12 to see Dr Toy Care for an assessment.

## 2020-01-17 ENCOUNTER — Other Ambulatory Visit: Payer: Self-pay

## 2020-01-17 ENCOUNTER — Ambulatory Visit (HOSPITAL_COMMUNITY): Payer: No Payment, Other | Admitting: *Deleted

## 2020-01-17 ENCOUNTER — Encounter (HOSPITAL_COMMUNITY): Payer: Self-pay

## 2020-01-17 VITALS — BP 157/78 | HR 107 | Ht 63.0 in | Wt 211.0 lb

## 2020-01-17 DIAGNOSIS — F200 Paranoid schizophrenia: Secondary | ICD-10-CM

## 2020-01-17 NOTE — Progress Notes (Signed)
In for her monthly injection of Aristada 441 mg given today in her L deltoid without difficulty. She states she is doing well but then asked me if her old psychiatrist was telling her to send her medicine somewhere but where I didn't understand. When questioned her further that she should only be working with one psychiatrist she said she is going to "have to talk to him about that" Otherwise she was pleasant, verbal and offered no complaints. She has gained a few pounds since the holidays, she is aware of this and said her New years resolution is to lose weight. To return in one month for her next injection and sees the provider next week.

## 2020-01-24 ENCOUNTER — Encounter (HOSPITAL_COMMUNITY): Payer: Self-pay | Admitting: Psychiatry

## 2020-01-24 ENCOUNTER — Other Ambulatory Visit: Payer: Self-pay

## 2020-01-24 ENCOUNTER — Ambulatory Visit (INDEPENDENT_AMBULATORY_CARE_PROVIDER_SITE_OTHER): Payer: No Payment, Other | Admitting: Psychiatry

## 2020-01-24 DIAGNOSIS — F200 Paranoid schizophrenia: Secondary | ICD-10-CM

## 2020-01-24 MED ORDER — HALOPERIDOL 0.5 MG PO TABS: 0.5000 mg | ORAL_TABLET | Freq: Two times a day (BID) | ORAL | 1 refills | Status: DC

## 2020-01-24 MED ORDER — TEMAZEPAM 30 MG PO CAPS: 30.0000 mg | ORAL_CAPSULE | Freq: Every evening | ORAL | 0 refills | Status: DC | PRN

## 2020-01-24 MED ORDER — BENZTROPINE MESYLATE 1 MG PO TABS: 1.0000 mg | ORAL_TABLET | Freq: Two times a day (BID) | ORAL | 1 refills | Status: DC

## 2020-01-24 MED ORDER — CITALOPRAM HYDROBROMIDE 10 MG PO TABS: 10.0000 mg | ORAL_TABLET | Freq: Every day | ORAL | 1 refills | Status: DC

## 2020-01-24 NOTE — Progress Notes (Signed)
BH MD/PA/NP OP Progress Note  01/24/2020 3:18 PM Kristine Atkinson  MRN:  [id number]  Chief Complaint: " Just increase the dose of my sleeping medicine."  HPI: Patient seen with her husband today.  Patient reported that she is still not sleeping at night.  She stated that the medicine does not do much and she is up pretty much the whole night.  Writer asked her husband about his thoughts, he said that she sleeps most of the nights it is only some nights that she is unable to sleep.  Patient had asked why did she receive prescription bottles from the pharmacy which had the name of her previous provider on them.  She stated that that provider is not supposed to be sending her any prescriptions anymore. Writer informed her that it could have been a pharmacy error and they may have filled the prescriptions from the previous provider. When writer asked how was her Christmas and the holidays, she replied that she did not have any dressing.  Upon clarification she stated that she did not have any dressing for the [place] that she was eating.  She then all of a sudden summed up the session by saying that all she needs is a higher dose of sleeping medicine at her injection and that said and then stood up from her chair.   Visit Diagnosis:    ICD-10-CM   1. Schizophrenia, paranoid ([place])  F20.0 citalopram (CELEXA) 10 MG tablet    benztropine (COGENTIN) 1 MG tablet    haloperidol (HALDOL) 0.5 MG tablet    temazepam (RESTORIL) 30 MG capsule    Past Psychiatric History: Schizophrenia  Past Medical History:  Past Medical History:  Diagnosis Date  . Cellulitis 06/06/2013  . Chest pain 06/07/2015  . Chronic pain   . Cocaine abuse ([place]) 09/09/2012   Cocaine positive on UDS 09/09/2012.    . Diabetes mellitus   . Elevated serum creatinine 08/14/2013  . Fibromyalgia   . Greater trochanteric bursitis of left hip 05/29/2014  . Hyperlipidemia   . Hypertension   . Syncope 08/14/2013    Past Surgical History:   Procedure Laterality Date  . OOPHORECTOMY     ectopic    Family Psychiatric History: denied  Family History:  Family History  Problem Relation Age of Onset  . Hypertension Mother   . Cancer Maternal Aunt        colon cancer    Social History:  Social History   Socioeconomic History  . Marital status: Married    Spouse name: Not on file  . Number of children: Not on file  . Years of education: Not on file  . Highest education level: Not on file  Occupational History  . Not on file  Tobacco Use  . Smoking status: Former Smoker    Packs/day: 0.20    Years: 41.00    Pack years: 8.20    Types: Cigarettes    Start date: 01/12/1973    Quit date: 06/13/2015    Years since quitting: 4.6  . Smokeless tobacco: Never Used  . Tobacco comment: smokes about 3-4 cigs per day  as of  02/2014  Substance and Sexual Activity  . Alcohol use: No    Alcohol/week: 0.0 standard drinks  . Drug use: Not on file  . Sexual activity: Not on file  Other Topics Concern  . Not on file  Social History Narrative   Lives with husband Caree Fakes).  Has 3 grown children.  Does not work.  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   Social Determinants of Health   Financial Resource Strain: Not on file  Food Insecurity: No Food Insecurity  . Worried About Charity fundraiser in the Last Year: Never true  . Ran Out of Food in the Last Year: Never true  Transportation Needs: No Transportation Needs  . Lack of Transportation (Medical): No  . Lack of Transportation (Non-Medical): No  Physical Activity: Not on file  Stress: Not on file  Social Connections: Not on file    Allergies:  Allergies  Allergen Reactions  . Penicillins Itching    Did it involve swelling of the face/tongue/throat, SOB, or low BP? Unknown Did it involve sudden or severe rash/hives, skin peeling, or any reaction on the inside of your mouth or nose? Unknown Did you need to seek medical attention  at a hospital or doctor's office? Unknown When did it last happen?Unknown If all above answers are "NO", may proceed with cephalosporin use.    Metabolic Disorder Labs: Lab Results  Component Value Date   HGBA1C 6.4 (H) 10/05/2019   No results found for: PROLACTIN Lab Results  Component Value Date   CHOL 138 10/05/2019   TRIG 207 (H) 10/05/2019   HDL 39 (L) 10/05/2019   CHOLHDL 4.8 08/27/2014   VLDL 26 08/27/2014   LDLCALC 65 10/05/2019   LDLCALC 81 06/16/2019   Lab Results  Component Value Date   TSH 0.667 03/02/2012   TSH 1.74 05/13/2011    Therapeutic Level Labs: No results found for: LITHIUM No results found for: VALPROATE No components found for:  CBMZ  Current Medications: Current Outpatient Medications  Medication Sig Dispense Refill  . temazepam (RESTORIL) 30 MG capsule Take 1 capsule (30 mg total) by mouth at bedtime as needed for sleep. 30 capsule 0  . AgaMatrix Ultra-Thin Lancets MISC Check blood glucose twice daily before meals 100 each 11  . aspirin 81 MG tablet Take 1 tablet (81 mg total) by mouth daily. 100 tablet 1  . benztropine (COGENTIN) 1 MG tablet Take 1 tablet (1 mg total) by mouth 2 (two) times daily. 60 tablet 1  . carvedilol (COREG) 3.125 MG tablet TAKE 1 TABLET (3.125 MG TOTAL) BY MOUTH 2 (TWO) TIMES DAILY. 60 tablet 11  . citalopram (CELEXA) 10 MG tablet Take 1 tablet (10 mg total) by mouth daily. 30 tablet 1  . cyclobenzaprine (FLEXERIL) 10 MG tablet TAKE 1 TABLET BY MOUTH THREE TIMES A DAY AS NEEDED FOR MUSCLE SPASM 30 tablet 2  . glucose blood (AGAMATRIX PRESTO TEST) test strip Check blood glucose twice daily before meals 100 each 11  . haloperidol (HALDOL) 0.5 MG tablet Take 1 tablet (0.5 mg total) by mouth 2 (two) times daily. 60 tablet 1  . hydrochlorothiazide (HYDRODIURIL) 25 MG tablet TAKE 1 TABLET BY MOUTH DAILY 30 tablet 11  . losartan (COZAAR) 50 MG tablet TAKE 1 TABLET (50 MG TOTAL) BY MOUTH DAILY. 30 tablet 11  . meloxicam (MOBIC)  15 MG tablet TAKE 1 TABLET BY MOUTH DAILY WITH FOOD FOR LEG PAIN (Patient taking differently: Take 15 mg by mouth daily. ) 30 tablet 11  . metFORMIN (GLUCOPHAGE-XR) 500 MG 24 hr tablet TAKE 1 TABLET BY MOUTH TWICE A DAY WITH MEALS 60 tablet 11  . omeprazole (PRILOSEC) 20 MG capsule TAKE 1 CAPSULE BY MOUTH TWICE A DAY 30 capsule 5  . pregabalin (LYRICA) 50 MG capsule Take 1 capsule (50  mg total) by mouth 3 (three) times daily. 270 capsule 3  . rosuvastatin (CRESTOR) 20 MG tablet TAKE 1 TABLET BY MOUTH DAILY WITH EVENING MEAL 30 tablet 11   Current Facility-Administered Medications  Medication Dose Route Frequency Provider Last Rate Last Admin  . ARIPiprazole Lauroxil ER (ARISTADA) 441 MG/1.6ML prefilled syringe 441 mg  441 mg Intramuscular Q28 days [place] Crane, MD   441 mg at 01/17/20 1507     Musculoskeletal: Strength & Muscle Tone: within normal limits Gait & Station: normal Patient leans: N/A  Psychiatric Specialty Exam: Review of Systems   There were no vitals taken for this visit.There is no height or weight on file to calculate BMI.  General Appearance: Fairly Groomed  Eye Contact:  Good  Speech:  Clear and Coherent and Normal Rate  Volume:  Normal  Mood:  Euthymic  Affect:  Congruent  Thought Process:  Goal Directed and Descriptions of Associations: Intact  Orientation:  Full (Time, Place, and Person)  Thought Content: Illogical and Rumination, focused on getting a higher dose of sleeping med for sleep  Suicidal Thoughts:  No  Homicidal Thoughts:  No  Memory:  Immediate;   Good Recent;   Good  Judgement:  Fair  Insight:  Fair  Psychomotor Activity:  Restlessness  Concentration:  Concentration: Good and Attention Span: Good  Recall:  Good  Fund of Knowledge: Good  Language: Good  Akathisia:  Negative  Handed:  Right  AIMS (if indicated): not done  Assets:  Communication Skills Desire for Improvement Financial Resources/Insurance Housing Social Support  ADL's:   Intact  Cognition: WNL  Sleep:  Poor as per patient, however husband states that she sleeps well on most nights.   Screenings: [place] Office Visit from 12/27/2015 in [place] Office Visit from 09/05/2015 in [place] Office Visit from 06/28/2015 in [place] Office Visit from 06/07/2015 in [place] Office Visit from 03/22/2015 in [place]  PHQ-2 Total Score 0 0 0 0 0  PHQ-9 Total Score 0 0 -- -- --       Assessment and Plan: Patient is fixated on getting harder to sleep medicine as she is unable to sleep.  We will go up on her dose of temazepam to address that.  Patient stated that all she needs is sleeping medicine and of the injection.  Her husband stated that she does take all her medications as prescribed at home.  1. Schizophrenia, paranoid ([place])  - ARIPiprazole Lauroxil ER (ARISTADA) 441 MG/1.6ML prefilled syringe 441 mg. Last Dose administered last week. - benztropine (COGENTIN) 1 MG tablet; Take 1 tablet (1 mg total) by mouth 2 (two) times daily.  Dispense: 60 tablet; Refill: 1 - citalopram (CELEXA) 10 MG tablet; Take 1 tablet (10 mg total) by mouth daily.  Dispense: 30 tablet; Refill: 1 - haloperidol (HALDOL) 0.5 MG tablet; Take 1 tablet (0.5 mg total) by mouth 2 (two) times daily.  Dispense: 60 tablet; Refill: 1 - Increase temazepam (RESTORIL) 30 MG capsule; Take 1 capsule (30 mg total) by mouth at bedtime as needed for sleep.  Dispense: 30 capsule; Refill: 1  Continue same regimen. F/up in 2 months.   [place] Crane, MD 01/24/2020, 3:18 PM

## 2020-01-30 ENCOUNTER — Other Ambulatory Visit: Payer: Self-pay | Admitting: Internal Medicine

## 2020-02-14 ENCOUNTER — Ambulatory Visit (HOSPITAL_COMMUNITY): Payer: No Payment, Other | Admitting: *Deleted

## 2020-02-14 ENCOUNTER — Encounter (HOSPITAL_COMMUNITY): Payer: Self-pay

## 2020-02-14 ENCOUNTER — Other Ambulatory Visit: Payer: Self-pay

## 2020-02-14 VITALS — BP 135/72 | HR 103 | Ht 63.0 in | Wt 213.0 lb

## 2020-02-14 DIAGNOSIS — F200 Paranoid schizophrenia: Secondary | ICD-10-CM

## 2020-02-14 NOTE — Progress Notes (Signed)
In today for scheduled injection appt. Got her shot today in her R deltoid, Arista 441 mg. She is to return in one month for her next shot. She is pleasant and in appropriately verbal. Her husband brought her to her appt as is their routine. She acknowledged her birthday card we sent to her. No complaint offered.

## 2020-02-16 ENCOUNTER — Ambulatory Visit (HOSPITAL_COMMUNITY): Payer: No Payment, Other

## 2020-03-13 ENCOUNTER — Ambulatory Visit (HOSPITAL_COMMUNITY): Payer: No Payment, Other | Admitting: Psychiatry

## 2020-03-13 ENCOUNTER — Ambulatory Visit (HOSPITAL_COMMUNITY): Payer: No Payment, Other

## 2020-03-20 ENCOUNTER — Ambulatory Visit (HOSPITAL_COMMUNITY): Payer: No Payment, Other

## 2020-03-20 ENCOUNTER — Ambulatory Visit (HOSPITAL_COMMUNITY): Payer: No Payment, Other | Admitting: Psychiatry

## 2020-04-02 ENCOUNTER — Ambulatory Visit: Payer: Self-pay | Admitting: Internal Medicine

## 2020-04-02 ENCOUNTER — Ambulatory Visit (HOSPITAL_COMMUNITY): Payer: No Payment, Other | Admitting: *Deleted

## 2020-04-02 ENCOUNTER — Other Ambulatory Visit: Payer: Self-pay

## 2020-04-02 ENCOUNTER — Encounter (HOSPITAL_COMMUNITY): Payer: Self-pay

## 2020-04-02 VITALS — BP 151/80 | HR 98 | Ht 63.0 in | Wt 212.0 lb

## 2020-04-02 DIAGNOSIS — F200 Paranoid schizophrenia: Secondary | ICD-10-CM

## 2020-04-02 NOTE — Progress Notes (Signed)
In late for her monthly injection. She was contacted by staff to remind her of her visit and she is late and told staff she was never given an appt, which is not accurate. Today she complains of a bump in her R deltoid. Writer could feel it too, suggested to her it was probably a knot from taking frequent injections, we will monitor it and not to be concerned. Gave her injection today in the L deltoid without difficulty. No no other complaints voiced. She is to return in one month for her next injection.

## 2020-04-30 ENCOUNTER — Encounter (HOSPITAL_COMMUNITY): Payer: Self-pay | Admitting: Psychiatry

## 2020-04-30 ENCOUNTER — Encounter (HOSPITAL_COMMUNITY): Payer: Self-pay

## 2020-04-30 ENCOUNTER — Ambulatory Visit (INDEPENDENT_AMBULATORY_CARE_PROVIDER_SITE_OTHER): Payer: No Payment, Other | Admitting: Psychiatry

## 2020-04-30 ENCOUNTER — Other Ambulatory Visit: Payer: Self-pay

## 2020-04-30 ENCOUNTER — Ambulatory Visit (INDEPENDENT_AMBULATORY_CARE_PROVIDER_SITE_OTHER): Payer: No Payment, Other | Admitting: *Deleted

## 2020-04-30 VITALS — BP 187/85 | HR 93 | Temp 97.7°F | Ht 63.0 in | Wt 212.0 lb

## 2020-04-30 DIAGNOSIS — F200 Paranoid schizophrenia: Secondary | ICD-10-CM

## 2020-04-30 MED ORDER — TEMAZEPAM 30 MG PO CAPS: 30.0000 mg | ORAL_CAPSULE | Freq: Every evening | ORAL | 1 refills | Status: DC | PRN

## 2020-04-30 NOTE — Progress Notes (Signed)
[facility] MD/PA/NP OP Progress Note  04/30/2020 3:20 PM Kristine Atkinson  MRN:  [id number]  Chief Complaint: " I don't want my shot today."  HPI: Patient was noted to be irritable and uncooperative today.  As soon as Probation officer walked in she got up from a chair and started walking towards the Probation officer in the office.  Writer had to step back and then open the door.  She stated that she does not want her injection today because her arms are hurting because of knots on both sides.  Writer asked her if she would like to get it in her hip and she declined. Writer had to step out of the office to discuss this with the nursing staff and nursing staff informed that patient told them that she was having diarrhea and that is why she cannot get her injection dose today. Of note, patient had missed her appointment for injection in early March and therefore had gotten her March dose later than usual.  Her last dose was administered on March 22.  Patient was somewhat argumentative with Probation officer and stated that other than sleeping medicine she is not getting any other medicines from the writer.  She stated that she is not lying.  Writer asked her if she was willing to give consent to the writer to speak to her husband who was in the lobby she reluctantly agreed.  Writer told her to wait in the office while the writer went to get her husband however patient followed the Probation officer and spoke with the Probation officer and her husband the lobby.  She stated that she takes a sleeping pill at night which does not really work much but she takes it Engineer, maintenance.  She stated that she is not getting any other medications from the writer.  Husband also stated the same. After some back-and-forth, patient reluctantly agreed to get her injection dose in her arm today.  She came back into the office and the nursing staff administered the IM Abilify Aristada injection dose in her arm.  In the meantime other nursing staff and Probation officer spoke with pharmacist at [facility] who confirmed the patient has not picked up any prescriptions for Haldol or Cogentin since April 2021.  In fact she has never picked up any prescription for Celexa. She has however picked up prescriptions for temazepam, last prescription was picked up on April 11.  Based on patient's noncompliance, writer recommended that we discontinue oral Haldol, Celexa and Cogentin as patient is not taking them anyways.  We will continue temazepam prescriptions.   Visit Diagnosis:    ICD-10-CM   1. Schizophrenia, paranoid ([place])  F20.0     Past Psychiatric History: Schizophrenia  Past Medical History:  Past Medical History:  Diagnosis Date  . Cellulitis 06/06/2013  . Chest pain 06/07/2015  . Chronic pain   . Cocaine abuse ([place]) 09/09/2012   Cocaine positive on UDS 09/09/2012.    . Diabetes mellitus   . Elevated serum creatinine 08/14/2013  . Fibromyalgia   . Greater trochanteric bursitis of left hip 05/29/2014  . Hyperlipidemia   . Hypertension   . Syncope 08/14/2013    Past Surgical History:  Procedure Laterality Date  . OOPHORECTOMY     ectopic    Family Psychiatric History: denied  Family History:  Family History  Problem Relation Age of Onset  . Hypertension Mother   . Cancer Maternal Aunt        colon cancer    Social History:  Social  History   Socioeconomic History  . Marital status: Married    Spouse name: Not on file  . Number of children: Not on file  . Years of education: Not on file  . Highest education level: Not on file  Occupational History  . Not on file  Tobacco Use  . Smoking status: Former Smoker    Packs/day: 0.20    Years: 41.00    Pack years: 8.20    Types: Cigarettes    Start date: 01/12/1973    Quit date: 06/13/2015    Years since quitting: 4.8  . Smokeless tobacco: Never Used  . Tobacco comment: smokes about 3-4 cigs per day  as of  02/2014  Substance and Sexual Activity  . Alcohol use: No    Alcohol/week: 0.0 standard drinks  . Drug use: Not  on file  . Sexual activity: Not on file  Other Topics Concern  . Not on file  Social History Narrative   Lives with husband Britni Driscoll).  Has 3 grown children.  Does not work.  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   Social Determinants of Health   Financial Resource Strain: Not on file  Food Insecurity: No Food Insecurity  . Worried About Charity fundraiser in the Last Year: Never true  . Ran Out of Food in the Last Year: Never true  Transportation Needs: No Transportation Needs  . Lack of Transportation (Medical): No  . Lack of Transportation (Non-Medical): No  Physical Activity: Not on file  Stress: Not on file  Social Connections: Not on file    Allergies:  Allergies  Allergen Reactions  . Penicillins Itching    Did it involve swelling of the face/tongue/throat, SOB, or low BP? Unknown Did it involve sudden or severe rash/hives, skin peeling, or any reaction on the inside of your mouth or nose? Unknown Did you need to seek medical attention at a hospital or doctor's office? Unknown When did it last happen?Unknown If all above answers are "NO", may proceed with cephalosporin use.    Metabolic Disorder Labs: Lab Results  Component Value Date   HGBA1C 6.4 (H) 10/05/2019   No results found for: PROLACTIN Lab Results  Component Value Date   CHOL 138 10/05/2019   TRIG 207 (H) 10/05/2019   HDL 39 (L) 10/05/2019   CHOLHDL 4.8 08/27/2014   VLDL 26 08/27/2014   LDLCALC 65 10/05/2019   LDLCALC 81 06/16/2019   Lab Results  Component Value Date   TSH 0.667 03/02/2012   TSH 1.74 05/13/2011    Therapeutic Level Labs: No results found for: LITHIUM No results found for: VALPROATE No components found for:  CBMZ  Current Medications: Current Outpatient Medications  Medication Sig Dispense Refill  . AgaMatrix Ultra-Thin Lancets MISC Check blood glucose twice daily before meals 100 each 11  . aspirin 81 MG tablet Take 1 tablet (81 mg  total) by mouth daily. 100 tablet 1  . benztropine (COGENTIN) 1 MG tablet Take 1 tablet (1 mg total) by mouth 2 (two) times daily. 60 tablet 1  . carvedilol (COREG) 3.125 MG tablet TAKE 1 TABLET (3.125 MG TOTAL) BY MOUTH 2 (TWO) TIMES DAILY. 60 tablet 11  . citalopram (CELEXA) 10 MG tablet Take 1 tablet (10 mg total) by mouth daily. 30 tablet 1  . cyclobenzaprine (FLEXERIL) 10 MG tablet TAKE 1 TABLET BY MOUTH THREE TIMES A DAY AS NEEDED FOR MUSCLE SPASM 30 tablet 2  . glucose blood (  AGAMATRIX PRESTO TEST) test strip Check blood glucose twice daily before meals 100 each 11  . haloperidol (HALDOL) 0.5 MG tablet Take 1 tablet (0.5 mg total) by mouth 2 (two) times daily. 60 tablet 1  . hydrochlorothiazide (HYDRODIURIL) 25 MG tablet TAKE 1 TABLET BY MOUTH DAILY 30 tablet 11  . losartan (COZAAR) 50 MG tablet TAKE 1 TABLET (50 MG TOTAL) BY MOUTH DAILY. 30 tablet 11  . meloxicam (MOBIC) 15 MG tablet TAKE 1 TABLET BY MOUTH DAILY WITH FOOD FOR LEG PAIN 30 tablet 9  . metFORMIN (GLUCOPHAGE-XR) 500 MG 24 hr tablet TAKE 1 TABLET BY MOUTH TWICE A DAY WITH MEALS 60 tablet 11  . omeprazole (PRILOSEC) 20 MG capsule TAKE 1 CAPSULE BY MOUTH TWICE A DAY 30 capsule 5  . pregabalin (LYRICA) 50 MG capsule Take 1 capsule (50 mg total) by mouth 3 (three) times daily. 270 capsule 3  . rosuvastatin (CRESTOR) 20 MG tablet TAKE 1 TABLET BY MOUTH DAILY WITH EVENING MEAL 30 tablet 11  . temazepam (RESTORIL) 30 MG capsule Take 1 capsule (30 mg total) by mouth at bedtime as needed for sleep. 30 capsule 0  . triamcinolone (KENALOG) 0.1 % APPLY TWICE DAILY WITH TERBINAFINE TO RASH UNTIL RESOLVED. 30 g 1   Current Facility-Administered Medications  Medication Dose Route Frequency Provider Last Rate Last Admin  . ARIPiprazole Lauroxil ER (ARISTADA) 441 MG/1.6ML prefilled syringe 441 mg  441 mg Intramuscular Q28 days [place] Crane, MD   441 mg at 04/02/20 1546     Musculoskeletal: Strength & Muscle Tone: within normal  limits Gait & Station: normal Patient leans: N/A  Psychiatric Specialty Exam: Review of Systems   There were no vitals taken for this visit.There is no height or weight on file to calculate BMI.  General Appearance: Fairly Groomed, somewhat uncooperative  Eye Contact:  Good  Speech:  Clear and Coherent and Normal Rate  Volume:  Normal  Mood:  Irritable  Affect:  Congruent  Thought Process:  Goal Directed and Descriptions of Associations: Intact  Orientation:  Full (Time, Place, and Person)  Thought Content: Illogical and Rumination, did not want to receive the IM monthly injection dose today but later agreed reluctantly  Suicidal Thoughts:  No  Homicidal Thoughts:  No  Memory:  Immediate;   Good Recent;   Good  Judgement:  Fair  Insight:  Fair  Psychomotor Activity:  Restlessness  Concentration:  Concentration: Good and Attention Span: Good  Recall:  Good  Fund of Knowledge: Good  Language: Good  Akathisia:  Negative  Handed:  Right  AIMS (if indicated): not done  Assets:  Communication Skills Desire for Improvement Financial Resources/Insurance Housing Social Support  ADL's:  Intact  Cognition: WNL  Sleep:  Poor as per patient, however husband states that she sleeps well on most nights.   Screenings: [place] Office Visit from 12/27/2015 in [place] Office Visit from 09/05/2015 in [place] Office Visit from 06/28/2015 in [place] Office Visit from 06/07/2015 in [place] Office Visit from 03/22/2015 in [place]  PHQ-2 Total Score 0 0 0 0 0  PHQ-9 Total Score 0 0 -- -- --       Assessment and Plan: Patient remains erratic and illogical at times which seems to be her baseline.  She has not been taking oral Haldol or Cogentin since she has not picked up any  prescriptions for these medicines since April 2021.  She has been taking temazepam  at bedtime and has been stated that it helps her with sleep.  She reluctantly agreed to get the IM Abilify Aristada injection today.  1. Schizophrenia, paranoid ([place])  - ARIPiprazole Lauroxil ER (ARISTADA) 441 MG/1.6ML prefilled syringe 441 mg. Dose administered today. - Continue temazepam (RESTORIL) 30 MG capsule; Take 1 capsule (30 mg total) by mouth at bedtime as needed for sleep.  Dispense: 30 capsule; Refill: 1   F/up in 2 months. Her care is being transferred to a different provider as the writer is leaving the office in the near future.  [place] Crane, MD 04/30/2020, 3:20 PM

## 2020-04-30 NOTE — Progress Notes (Signed)
PATIENT ARRIVED FOR F/U MM  & INJECTION. PATIENT IMMEDIATELY INFORMED THAT SHE DOESN'T WANT TO GET THE "SHOT". DURING F/U PATIENT NOTED TO PROVIDER THAT THE RESTORIL IS HELPING HER SLEEP SOME. PATIENT SEEMED A LITTLE CONFUSED WHEN ASKED ABOUT HER HALDOL TABS.  THE R.N. SUE FOLLOWED UP WITH THE Rx [facility]  & WAS INFORMED PATIENT DOESN'T PICK UP THE HALDOL. PATIENT ADAMANTLY FINALLY " HUFFED & SAID I WILL TAKE THE SHOT" WHEN SITTING WITH HUSBAND  & ASKED ABOUT HER TAKING HER MED THE HALDOL.  PATIENT TOLERATED INJECTION IN RIGHT ARM BECAUSE SHE SAID SHE HAD A BUMP ON LEFT ARM.  NO HI/SI NOR AH/VH.

## 2020-05-06 ENCOUNTER — Other Ambulatory Visit: Payer: Self-pay | Admitting: Internal Medicine

## 2020-05-06 DIAGNOSIS — K219 Gastro-esophageal reflux disease without esophagitis: Secondary | ICD-10-CM

## 2020-05-28 ENCOUNTER — Ambulatory Visit (HOSPITAL_COMMUNITY): Payer: No Payment, Other | Admitting: *Deleted

## 2020-05-28 ENCOUNTER — Other Ambulatory Visit: Payer: Self-pay

## 2020-05-28 ENCOUNTER — Encounter (HOSPITAL_COMMUNITY): Payer: Self-pay

## 2020-05-28 VITALS — BP 144/68 | HR 95 | Ht 63.0 in | Wt 206.0 lb

## 2020-05-28 DIAGNOSIS — F200 Paranoid schizophrenia: Secondary | ICD-10-CM

## 2020-05-28 NOTE — Progress Notes (Signed)
In as scheduled for her monthly injection of Aristada 441 mg given today in her R deltoid without problems. She is funny, verbal and pleasant today. She denies psychotic sx and is not SI or HI. When I weighed her she made reference to a girl telling her she gained weight, when asked who told her that she didn't answer. Question if that was a delusional statement, she at times does make reference to people telling her things and when ask her for specifics she is unable to provide them. Her husband brings her to her appts but remains in the lobby when she comes back to clinic for her shot. She continues to report "bumps" on both of her arms from the shot. She however refuses to get the shot in her gluteal muscle. Will return in one month for her next shot.

## 2020-06-17 ENCOUNTER — Other Ambulatory Visit: Payer: Self-pay | Admitting: Internal Medicine

## 2020-06-18 ENCOUNTER — Ambulatory Visit (HOSPITAL_COMMUNITY): Payer: No Payment, Other | Admitting: Psychiatry

## 2020-06-25 ENCOUNTER — Ambulatory Visit (INDEPENDENT_AMBULATORY_CARE_PROVIDER_SITE_OTHER): Payer: No Payment, Other | Admitting: Psychiatry

## 2020-06-25 ENCOUNTER — Encounter (HOSPITAL_COMMUNITY): Payer: Self-pay | Admitting: Psychiatry

## 2020-06-25 ENCOUNTER — Ambulatory Visit (INDEPENDENT_AMBULATORY_CARE_PROVIDER_SITE_OTHER): Payer: No Payment, Other | Admitting: *Deleted

## 2020-06-25 ENCOUNTER — Other Ambulatory Visit: Payer: Self-pay

## 2020-06-25 VITALS — BP 155/73 | HR 91 | Ht 63.0 in | Wt 204.0 lb

## 2020-06-25 DIAGNOSIS — F200 Paranoid schizophrenia: Secondary | ICD-10-CM

## 2020-06-25 MED ORDER — ARISTADA 441 MG/1.6ML IM PRSY: 441.0000 mg | PREFILLED_SYRINGE | INTRAMUSCULAR | 12 refills | Status: DC

## 2020-06-25 MED ORDER — TEMAZEPAM 30 MG PO CAPS: 30.0000 mg | ORAL_CAPSULE | Freq: Every evening | ORAL | 2 refills | Status: DC | PRN

## 2020-06-25 MED ORDER — TRAZODONE HCL 50 MG PO TABS: 50.0000 mg | ORAL_TABLET | Freq: Every day | ORAL | 2 refills | Status: DC

## 2020-06-25 NOTE — Progress Notes (Signed)
BH MD/PA/NP OP Progress Note  06/25/2020 3:46 PM Kristine Atkinson  MRN:  [id number]  Chief Complaint: "Im still not sleeping"  HPI: 65 year old female seen today for follow up evaluation.  She is a former patient of Dr. Jean Atkinson is being transferred to provider for medication management.  She has a psychiatric history of anxiety, depression, and paranoid schizophrenia.  She is currently managed on Aristada 441 mg every 28 days and Restoril 30 mg nightly.  She notes that her medications are somewhat effective in managing her psychiatric conditions.  Today she is well groomed, pleasant, cooperative, and engaged in conversations.  During assessment she was pleasant, cooperative, and somewhat engaged in conversation.  Patient notes that she was in pain.  Provider observed that patient had a slight limp and was guarding her left leg.  She informed provider that she is unsure why her leg is hurting.  She quantifies her pain as 10 out of 10 and notes that she is not taking any medications to relieve her pain.  Patient informed provider that most days she is anxious and depressed.  She however informed Probation officer that she is more concerned about her sleep as she is not sleeping well.  Today provider conducted a GAD-7 and patient scored 17.  Provider also conducted a PHQ-9 and patient scored a 14.  She endorses adequate appetite.  Today she denies SI/HI/VAH, mania, paranoia.    Patient is agreeable to starting trazodone 50 mg to help manage anxiety, depression, and sleep. Potential side effects of medication and risks vs benefits of treatment vs non-treatment were explained and discussed. All questions were answered.  She will continue all other medications as prescribed.  No other concerns noted at this time.   Visit Diagnosis:    ICD-10-CM   1. Schizophrenia, paranoid ([place])  F20.0 temazepam (RESTORIL) 30 MG capsule    traZODone (DESYREL) 50 MG tablet    ARIPiprazole Lauroxil ER (ARISTADA) 441 MG/1.6ML  prefilled syringe      Past Psychiatric History: anxiety, depression, and paranoid schizophrenia  Past Medical History:  Past Medical History:  Diagnosis Date   Cellulitis 06/06/2013   Chest pain 06/07/2015   Chronic pain    Cocaine abuse ([place]) 09/09/2012   Cocaine positive on UDS 09/09/2012.     Diabetes mellitus    Elevated serum creatinine 08/14/2013   Fibromyalgia    Greater trochanteric bursitis of left hip 05/29/2014   Hyperlipidemia    Hypertension    Syncope 08/14/2013    Past Surgical History:  Procedure Laterality Date   OOPHORECTOMY     ectopic    Family Psychiatric History: denied  Family History:  Family History  Problem Relation Age of Onset   Hypertension Mother    Cancer Maternal Aunt        colon cancer    Social History:  Social History   Socioeconomic History   Marital status: Married    Spouse name: Not on file   Number of children: Not on file   Years of education: Not on file   Highest education level: Not on file  Occupational History   Not on file  Tobacco Use   Smoking status: Former    Packs/day: 0.20    Years: 41.00    Pack years: 8.20    Types: Cigarettes    Start date: 01/12/1973    Quit date: 06/13/2015    Years since quitting: 5.0   Smokeless tobacco: Never   Tobacco comments:  smokes about 3-4 cigs per day  as of  02/2014  Substance and Sexual Activity   Alcohol use: No    Alcohol/week: 0.0 standard drinks   Drug use: Not on file   Sexual activity: Not on file  Other Topics Concern   Not on file  Social History Narrative   Lives with husband Kristine Atkinson).  Has 3 grown children.  Does not work.  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   Social Determinants of Health   Financial Resource Strain: Not on file  Food Insecurity: No Food Insecurity   Worried About Charity fundraiser in the Last Year: Never true   Ran Out of Food in the Last Year: Never true  Transportation Needs: No  Transportation Needs   Lack of Transportation (Medical): No   Lack of Transportation (Non-Medical): No  Physical Activity: Not on file  Stress: Not on file  Social Connections: Not on file    Allergies:  Allergies  Allergen Reactions   Penicillins Itching    Did it involve swelling of the face/tongue/throat, SOB, or low BP? Unknown Did it involve sudden or severe rash/hives, skin peeling, or any reaction on the inside of your mouth or nose? Unknown Did you need to seek medical attention at a hospital or doctor's office? Unknown When did it last happen? Unknown If all above answers are "NO", may proceed with cephalosporin use.    Metabolic Disorder Labs: Lab Results  Component Value Date   HGBA1C 6.4 (H) 10/05/2019   No results found for: PROLACTIN Lab Results  Component Value Date   CHOL 138 10/05/2019   TRIG 207 (H) 10/05/2019   HDL 39 (L) 10/05/2019   CHOLHDL 4.8 08/27/2014   VLDL 26 08/27/2014   LDLCALC 65 10/05/2019   LDLCALC 81 06/16/2019   Lab Results  Component Value Date   TSH 0.667 03/02/2012   TSH 1.74 05/13/2011    Therapeutic Level Labs: No results found for: LITHIUM No results found for: VALPROATE No components found for:  CBMZ  Current Medications: Current Outpatient Medications  Medication Sig Dispense Refill   ARIPiprazole Lauroxil ER (ARISTADA) 441 MG/1.6ML prefilled syringe Inject 441 mg into the muscle every 28 (twenty-eight) days. 1.5 mL 12   traZODone (DESYREL) 50 MG tablet Take 1 tablet (50 mg total) by mouth at bedtime. 30 tablet 2   AgaMatrix Ultra-Thin Lancets MISC Check blood glucose twice daily before meals 100 each 11   aspirin 81 MG tablet Take 1 tablet (81 mg total) by mouth daily. 100 tablet 1   carvedilol (COREG) 3.125 MG tablet TAKE 1 TABLET BY MOUTH TWICE A DAY 60 tablet 4   cyclobenzaprine (FLEXERIL) 10 MG tablet TAKE 1 TABLET BY MOUTH THREE TIMES A DAY AS NEEDED FOR MUSCLE SPASM 30 tablet 2   glucose blood (AGAMATRIX PRESTO  TEST) test strip Check blood glucose twice daily before meals 100 each 11   hydrochlorothiazide (HYDRODIURIL) 25 MG tablet TAKE 1 TABLET BY MOUTH DAILY 30 tablet 4   losartan (COZAAR) 50 MG tablet TAKE 1 TABLET BY MOUTH DAILY 30 tablet 4   meloxicam (MOBIC) 15 MG tablet TAKE 1 TABLET BY MOUTH DAILY WITH FOOD FOR LEG PAIN 30 tablet 9   metFORMIN (GLUCOPHAGE-XR) 500 MG 24 hr tablet TAKE 1 TABLET BY MOUTH TWICE A DAY WITH MEALS 60 tablet 11   omeprazole (PRILOSEC) 20 MG capsule TAKE 1 CAPSULE BY MOUTH TWICE A DAY 30 capsule 5  pregabalin (LYRICA) 50 MG capsule Take 1 capsule (50 mg total) by mouth 3 (three) times daily. 270 capsule 3   rosuvastatin (CRESTOR) 20 MG tablet TAKE 1 TABLET BY MOUTH DAILY WITH EVENING MEAL 30 tablet 11   temazepam (RESTORIL) 30 MG capsule Take 1 capsule (30 mg total) by mouth at bedtime as needed for sleep. 30 capsule 2   triamcinolone (KENALOG) 0.1 % APPLY TWICE DAILY WITH TERBINAFINE TO RASH UNTIL RESOLVED. 30 g 1   Current Facility-Administered Medications  Medication Dose Route Frequency Provider Last Rate Last Admin   ARIPiprazole Lauroxil ER (ARISTADA) 441 MG/1.6ML prefilled syringe 441 mg  441 mg Intramuscular Q28 days [place] Crane, MD   441 mg at 06/25/20 1442     Musculoskeletal: Strength & Muscle Tone: decreased Gait & Station: broad based Patient leans: N/A  Psychiatric Specialty Exam: Review of Systems  There were no vitals taken for this visit.There is no height or weight on file to calculate BMI.  General Appearance: Well Groomed  Eye Contact:  Good  Speech:  Clear and Coherent and Normal Rate  Volume:  Normal  Mood:  Anxious, Depressed, and Irritable  Affect:  Appropriate and Congruent  Thought Process:  Coherent, Goal Directed, and Linear  Orientation:  Full (Time, Place, and Person)  Thought Content: WDL and Logical   Suicidal Thoughts:  No  Homicidal Thoughts:  No  Memory:  Immediate;   Good Recent;   Good Remote;   Good  Judgement:   Good  Insight:  Good  Psychomotor Activity:  Wide Base  Concentration:  Concentration: Good and Attention Span: Good  Recall:  Good  Fund of Knowledge: Good  Language: Good  Akathisia:  No  Handed:  Right  AIMS (if indicated): not done  Assets:  Communication Skills Desire for Improvement Financial Resources/Insurance Housing Intimacy Leisure Time Physical Health Social Support  ADL's:  Intact  Cognition: WNL  Sleep:  Poor   Screenings: GAD-7    Flowsheet Row [facility]25/[facility] in [facility]  Total GAD-7 Score 17      PHQ2-9    [place] Office Visit from 06/25/2020 in [facility] Office Visit from 12/27/2015 in [place] Office Visit from 09/05/2015 in [place] Office Visit from 06/28/2015 in [place] Office Visit from 06/07/2015 in [place]  PHQ-2 Total Score 1 0 0 0 0  PHQ-9 Total Score 14 0 0 -- --        Assessment and Plan: Patient endorses symptoms of insomnia, anxiety, and depression.  She also informed Probation officer that she has a lot of pain today.  States she is agreeable to starting trazodone 50 mg nightly to help manage sleep, anxiety, depression.  She will continue all other medications as prescribed.  1. Schizophrenia, paranoid ([place])  Continue- temazepam (RESTORIL) 30 MG capsule; Take 1 capsule (30 mg total) by mouth at bedtime as needed for sleep.  Dispense: 30 capsule; Refill: 2 Start- traZODone (DESYREL) 50 MG tablet; Take 1 tablet (50 mg total) by mouth at bedtime.  Dispense: 30 tablet; Refill: 2 Continue- ARIPiprazole Lauroxil ER (ARISTADA) 441 MG/1.6ML prefilled syringe; Inject 441 mg into the muscle every 28 (twenty-eight) days.  Dispense: 1.5 mL; Refill: 12  Follow-up in 3 months    Salley Slaughter, NP 06/25/2020, 3:46 PM

## 2020-06-25 NOTE — Progress Notes (Signed)
Patient arrived today for injection ARIPiprazole Lauroxil ER (ARISTADA) 441 MG/1.6 ML. Patient pleasant as always NO HI/SI NOR AH/VH.Injection tolerated well in Left Arm.

## 2020-07-16 ENCOUNTER — Other Ambulatory Visit (HOSPITAL_COMMUNITY): Payer: Self-pay | Admitting: Psychiatry

## 2020-07-16 DIAGNOSIS — F200 Paranoid schizophrenia: Secondary | ICD-10-CM

## 2020-07-23 ENCOUNTER — Encounter: Payer: Self-pay | Admitting: Internal Medicine

## 2020-07-23 ENCOUNTER — Ambulatory Visit (HOSPITAL_COMMUNITY): Payer: No Payment, Other

## 2020-07-24 ENCOUNTER — Ambulatory Visit: Payer: Self-pay | Admitting: Internal Medicine

## 2020-07-25 ENCOUNTER — Encounter (HOSPITAL_COMMUNITY): Payer: Self-pay

## 2020-07-25 ENCOUNTER — Ambulatory Visit (INDEPENDENT_AMBULATORY_CARE_PROVIDER_SITE_OTHER): Payer: No Payment, Other | Admitting: *Deleted

## 2020-07-25 ENCOUNTER — Other Ambulatory Visit: Payer: Self-pay

## 2020-07-25 VITALS — BP 138/76 | HR 95 | Ht 63.0 in | Wt 203.0 lb

## 2020-07-25 DIAGNOSIS — F200 Paranoid schizophrenia: Secondary | ICD-10-CM

## 2020-07-25 NOTE — Progress Notes (Signed)
Patient arrived for injection ARIPiprazole Lauroxil ER (ARISTADA) 441 MG/1.6 ML. Patient pleasant  NO HI/SI NOR AH/VH. Tolerated injection well in  Right-Arm

## 2020-08-20 ENCOUNTER — Ambulatory Visit: Payer: Self-pay | Admitting: Internal Medicine

## 2020-08-27 ENCOUNTER — Ambulatory Visit (HOSPITAL_COMMUNITY): Payer: No Payment, Other | Admitting: *Deleted

## 2020-08-27 ENCOUNTER — Encounter (HOSPITAL_COMMUNITY): Payer: Self-pay

## 2020-08-27 ENCOUNTER — Other Ambulatory Visit: Payer: Self-pay

## 2020-08-27 VITALS — BP 165/92 | HR 87 | Ht 63.0 in | Wt 203.0 lb

## 2020-08-27 DIAGNOSIS — F200 Paranoid schizophrenia: Secondary | ICD-10-CM

## 2020-08-27 NOTE — Progress Notes (Signed)
In as scheduled for her aristada 441 mg injection, given today in her R DELTOID without difficulty. She came in asking to speak with Probation officer on arrival, wanted to borrow 5.00 from Probation officer, and asked for Tylenol. Unusual. Asked her husband if he was aware of her having a crisis and what it was, he said no and whatever it was he could handle it. She seemed more agitated than usual and her BP was elevated today. Husband forgot to bring financial information but will bring it thurs to help with her PAP app. She is to return in one month for his next injection.

## 2020-09-13 ENCOUNTER — Other Ambulatory Visit: Payer: Self-pay | Admitting: Internal Medicine

## 2020-09-24 ENCOUNTER — Encounter (HOSPITAL_COMMUNITY): Payer: Self-pay | Admitting: Psychiatry

## 2020-09-24 ENCOUNTER — Other Ambulatory Visit: Payer: Self-pay

## 2020-09-24 ENCOUNTER — Ambulatory Visit (INDEPENDENT_AMBULATORY_CARE_PROVIDER_SITE_OTHER): Payer: No Payment, Other | Admitting: Psychiatry

## 2020-09-24 ENCOUNTER — Ambulatory Visit (HOSPITAL_COMMUNITY): Payer: No Payment, Other | Admitting: *Deleted

## 2020-09-24 VITALS — BP 134/85 | HR 105 | Ht 63.0 in | Wt 200.0 lb

## 2020-09-24 DIAGNOSIS — G2401 Drug induced subacute dyskinesia: Secondary | ICD-10-CM | POA: Diagnosis not present

## 2020-09-24 DIAGNOSIS — F200 Paranoid schizophrenia: Secondary | ICD-10-CM

## 2020-09-24 MED ORDER — ARISTADA 441 MG/1.6ML IM PRSY: PREFILLED_SYRINGE | INTRAMUSCULAR | 11 refills | Status: DC

## 2020-09-24 MED ORDER — TRAZODONE HCL 50 MG PO TABS: 50.0000 mg | ORAL_TABLET | Freq: Every day | ORAL | 2 refills | Status: DC

## 2020-09-24 MED ORDER — VALBENAZINE TOSYLATE 40 MG PO CAPS: 40.0000 mg | ORAL_CAPSULE | Freq: Every day | ORAL | 3 refills | Status: DC

## 2020-09-24 MED ORDER — TEMAZEPAM 30 MG PO CAPS: 30.0000 mg | ORAL_CAPSULE | Freq: Every evening | ORAL | 2 refills | Status: DC | PRN

## 2020-09-24 MED ORDER — ARIPIPRAZOLE LAUROXIL ER 441 MG/1.6ML IM PRSY
441.0000 mg | PREFILLED_SYRINGE | INTRAMUSCULAR | Status: AC
  Administered 2020-09-24 – 2021-07-24 (×6): 441 mg via INTRAMUSCULAR

## 2020-09-24 NOTE — Progress Notes (Signed)
Patient ID: Kristine Atkinson, female   DOB: 05-Oct-1955, 65 y.o.   MRN: [id number] In as planned for her monthly injection of Aristada 441 mg, given today in her L DELTOID without issue. She was also seen today by provider Eulis Canner NP for her three month assessment. She offers not complaints other than the "shot hurts". She is to return in one month for her next injection. She gets patient assistance for her injection.

## 2020-09-24 NOTE — Progress Notes (Signed)
BH MD/PA/NP OP Progress Note  09/24/2020 2:53 PM Kristine Atkinson  MRN:  [id number]  Chief Complaint: "My hand is shaking"  HPI: 65 year old female seen today for follow up evaluation.   She has a psychiatric history of anxiety, depression, and paranoid schizophrenia.  She is currently managed on Aristada 441 mg every 28 days, Trazodone 50 mg nightly as needed, and Restoril 30 mg nightly.  She notes that her medications are somewhat effective in managing her psychiatric conditions.  Today she is well groomed, pleasant, cooperative, and engaged in conversations.  She informed Probation officer that her hands is been shaking.  She notes that this has been going on for a month and reports that she is unable to control it.  Provider conducted an aims assessment and patient scored a 4.  She notes her anxiety and depression are minimal today.  Patient unable to answer questions to GAD-7 and PHQ-9 today.  Patient seemed confused during exam however when asked questions about orientation, date, and time she accurately answer them.  Patient notes that she sleeps 4 or less hours nightly.  She informed Probation officer that she has only been taking the trazodone and not the temazepam.  She reports that she will start it today to see if it helps manages her sleep.  She endorses adequate appetite.  Today she denies SI/HI/VAH, mania, paranoia.    Today patient given 2-week sample of Ingrezza 40 mg to help manage potential TD. Potential side effects of medication and risks vs benefits of treatment vs non-treatment were explained and discussed. All questions were answered. She will continue all other medications as prescribed.  No other concerns noted at this time.   Visit Diagnosis:    ICD-10-CM   1. Schizophrenia, paranoid ([place])  F20.0       Past Psychiatric History: anxiety, depression, and paranoid schizophrenia  Past Medical History:  Past Medical History:  Diagnosis Date   Cellulitis 06/06/2013   Chest pain 06/07/2015    Chronic pain    Cocaine abuse ([place]) 09/09/2012   Cocaine positive on UDS 09/09/2012.     Diabetes mellitus    Elevated serum creatinine 08/14/2013   Fibromyalgia    Greater trochanteric bursitis of left hip 05/29/2014   Hyperlipidemia    Hypertension    Pneumonia due to COVID-19 virus 02/11/2019   Hospitalized with hypoxia, but did not require intubation/ventilation.   Schizophrenia ([place])    Syncope 08/14/2013    Past Surgical History:  Procedure Laterality Date   OOPHORECTOMY     ectopic    Family Psychiatric History: denied  Family History:  Family History  Problem Relation Age of Onset   Hypertension Mother    Cancer Maternal Aunt        colon cancer    Social History:  Social History   Socioeconomic History   Marital status: Married    Spouse name: Not on file   Number of children: Not on file   Years of education: Not on file   Highest education level: Not on file  Occupational History   Not on file  Tobacco Use   Smoking status: Former    Packs/day: 0.20    Years: 41.00    Pack years: 8.20    Types: Cigarettes    Start date: 01/12/1973    Quit date: 06/13/2015    Years since quitting: 5.2   Smokeless tobacco: Never   Tobacco comments:    smokes about 3-4 cigs per day  as  of  02/2014  Substance and Sexual Activity   Alcohol use: No    Alcohol/week: 0.0 standard drinks   Drug use: Not on file   Sexual activity: Not on file  Other Topics Concern   Not on file  Social History Narrative   Lives with husband Nicholle Coldren).  Has 3 grown children.  Does not work.  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   Social Determinants of Health   Financial Resource Strain: Not on file  Food Insecurity: No Food Insecurity   Worried About Charity fundraiser in the Last Year: Never true   Ran Out of Food in the Last Year: Never true  Transportation Needs: No Transportation Needs   Lack of Transportation (Medical): No   Lack of  Transportation (Non-Medical): No  Physical Activity: Not on file  Stress: Not on file  Social Connections: Not on file    Allergies:  Allergies  Allergen Reactions   Penicillins Itching    Did it involve swelling of the face/tongue/throat, SOB, or low BP? Unknown Did it involve sudden or severe rash/hives, skin peeling, or any reaction on the inside of your mouth or nose? Unknown Did you need to seek medical attention at a hospital or doctor's office? Unknown When did it last happen? Unknown If all above answers are "NO", may proceed with cephalosporin use.    Metabolic Disorder Labs: Lab Results  Component Value Date   HGBA1C 6.4 (H) 10/05/2019   No results found for: PROLACTIN Lab Results  Component Value Date   CHOL 138 10/05/2019   TRIG 207 (H) 10/05/2019   HDL 39 (L) 10/05/2019   CHOLHDL 4.8 08/27/2014   VLDL 26 08/27/2014   LDLCALC 65 10/05/2019   LDLCALC 81 06/16/2019   Lab Results  Component Value Date   TSH 0.667 03/02/2012   TSH 1.74 05/13/2011    Therapeutic Level Labs: No results found for: LITHIUM No results found for: VALPROATE No components found for:  CBMZ  Current Medications: Current Outpatient Medications  Medication Sig Dispense Refill   AgaMatrix Ultra-Thin Lancets MISC Check blood glucose twice daily before meals 100 each 11   ARISTADA 441 MG/1.6ML prefilled syringe INJECT '441MG'$  INTRAMUSCULARLY EVERY 4 WEEKS 1.6 mL 0   aspirin 81 MG tablet Take 1 tablet (81 mg total) by mouth daily. 100 tablet 1   carvedilol (COREG) 3.125 MG tablet TAKE 1 TABLET BY MOUTH TWICE A DAY 60 tablet 4   cyclobenzaprine (FLEXERIL) 10 MG tablet TAKE 1 TABLET BY MOUTH THREE TIMES A DAY AS NEEDED FOR MUSCLE SPASM 30 tablet 2   glucose blood (AGAMATRIX PRESTO TEST) test strip Check blood glucose twice daily before meals 100 each 11   hydrochlorothiazide (HYDRODIURIL) 25 MG tablet TAKE 1 TABLET BY MOUTH DAILY 30 tablet 4   losartan (COZAAR) 50 MG tablet TAKE 1 TABLET BY  MOUTH DAILY 30 tablet 4   meloxicam (MOBIC) 15 MG tablet TAKE 1 TABLET BY MOUTH DAILY WITH FOOD FOR LEG PAIN 30 tablet 9   metFORMIN (GLUCOPHAGE-XR) 500 MG 24 hr tablet TAKE 1 TABLET BY MOUTH TWICE A DAY WITH MEALS 60 tablet 1   omeprazole (PRILOSEC) 20 MG capsule TAKE 1 CAPSULE BY MOUTH TWICE A DAY 30 capsule 5   pregabalin (LYRICA) 50 MG capsule Take 1 capsule (50 mg total) by mouth 3 (three) times daily. 270 capsule 3   rosuvastatin (CRESTOR) 20 MG tablet TAKE 1 TABLET BY MOUTH DAILY WITH EVENING  MEAL 30 tablet 1   temazepam (RESTORIL) 30 MG capsule Take 1 capsule (30 mg total) by mouth at bedtime as needed for sleep. 30 capsule 2   traZODone (DESYREL) 50 MG tablet Take 1 tablet (50 mg total) by mouth at bedtime. 30 tablet 2   triamcinolone (KENALOG) 0.1 % APPLY TWICE DAILY WITH TERBINAFINE TO RASH UNTIL RESOLVED. 30 g 1   Current Facility-Administered Medications  Medication Dose Route Frequency Provider Last Rate Last Admin   ARIPiprazole Lauroxil ER (ARISTADA) 441 MG/1.6ML prefilled syringe 441 mg  441 mg Intramuscular Q28 days Salley Slaughter, NP         Musculoskeletal: Strength & Muscle Tone: within normal limits Gait & Station: normal Patient leans: N/A  Psychiatric Specialty Exam: Review of Systems  There were no vitals taken for this visit.There is no height or weight on file to calculate BMI.  General Appearance: Well Groomed  Eye Contact:  Good  Speech:  Clear and Coherent and Normal Rate  Volume:  Normal  Mood:  Euthymic  Affect:  Appropriate and Congruent  Thought Process:  Coherent, Goal Directed, and Linear  Orientation:  Other, patient seemed confused at times however was oriented to person, place, and time  Thought Content: WDL and Logical   Suicidal Thoughts:  No  Homicidal Thoughts:  No  Memory:  Immediate;   Good Recent;   Good Remote;   Good  Judgement:  Good  Insight:  Good  Psychomotor Activity:  Wide Base  Concentration:  Concentration: Good and  Attention Span: Good  Recall:  Good  Fund of Knowledge: Good  Language: Good  Akathisia:  No  Handed:  Right  AIMS (if indicated): not done  Assets:  Communication Skills Desire for Improvement Financial Resources/Insurance Housing Intimacy Leisure Time Physical Health Social Support  ADL's:  Intact  Cognition: WNL  Sleep:  Poor   Screenings: AIMS    Flowsheet Row Clinical Support from 09/24/2020 in [place] Total Score 4      GAD-7    [place] Office Visit from 06/25/2020 in [facility]  Total GAD-7 Score 17      PHQ2-9    [place] Visit from 06/25/2020 in [facility] Office Visit from 12/27/2015 in [place] Office Visit from 09/05/2015 in [place] Office Visit from 06/28/2015 in [place] Office Visit from 06/07/2015 in [place]  PHQ-2 Total Score 1 0 0 0 0  PHQ-9 Total Score 14 0 0 -- --        Assessment and Plan: Patient endorses symptoms of insomnia and TD.  She informed Probation officer that she has only been taking trazodone however reports that she will start temazepam today. Today patient given 2-week sample of Ingrezza 40 mg to help manage potential TD. She will continue all other medications as prescribed.  Provider discussed walk-in hours with patient and told her she can call the clinic if she had side effects from medications.  1. Schizophrenia, paranoid ([place])  Continue- ARIPiprazole Lauroxil ER (ARISTADA) 441 MG/1.6ML prefilled syringe; INJECT '441MG'$  INTRAMUSCULARLY EVERY 4 WEEKS  Dispense: 1.6 mL; Refill: 11 Continue- temazepam (RESTORIL) 30 MG capsule; Take 1 capsule (30 mg total) by mouth at bedtime as needed for sleep.  Dispense: 30 capsule; Refill: 2 Continue- traZODone (DESYREL) 50 MG tablet; Take 1 tablet (50 mg total) by mouth at bedtime.  Dispense: 30  tablet; Refill: 2  2. Tardive dyskinesia  Start- valbenazine (INGREZZA) 40 MG capsule; Take 1 capsule (40 mg total) by mouth daily.  Dispense: 30 capsule; Refill: 3   Follow-up in 3 months    Salley Slaughter, NP 09/24/2020, 2:53 PM

## 2020-09-24 NOTE — Progress Notes (Signed)
Patient arrived for ARISTADA 441 MG/1.6ML  Patient pleasant as always.tolerated injection well in Left Arm.

## 2020-09-26 ENCOUNTER — Encounter (HOSPITAL_COMMUNITY): Payer: No Payment, Other | Admitting: Psychiatry

## 2020-10-21 ENCOUNTER — Ambulatory Visit: Payer: Self-pay | Admitting: Internal Medicine

## 2020-10-22 ENCOUNTER — Encounter (HOSPITAL_COMMUNITY): Payer: Self-pay

## 2020-10-22 ENCOUNTER — Other Ambulatory Visit: Payer: Self-pay

## 2020-10-22 ENCOUNTER — Ambulatory Visit (HOSPITAL_COMMUNITY): Payer: No Payment, Other

## 2020-10-29 ENCOUNTER — Encounter (HOSPITAL_COMMUNITY): Payer: Self-pay

## 2020-10-29 ENCOUNTER — Ambulatory Visit (HOSPITAL_COMMUNITY): Payer: No Payment, Other | Admitting: *Deleted

## 2020-10-29 ENCOUNTER — Other Ambulatory Visit: Payer: Self-pay

## 2020-10-29 VITALS — BP 182/81 | HR 102 | Wt 202.0 lb

## 2020-10-29 DIAGNOSIS — F200 Paranoid schizophrenia: Secondary | ICD-10-CM

## 2020-10-29 NOTE — Progress Notes (Signed)
Administered Artistada 441 mg on the right deltoid. Pt tolerated well and very pleasant.

## 2020-11-26 ENCOUNTER — Ambulatory Visit (HOSPITAL_COMMUNITY): Payer: No Payment, Other

## 2020-11-26 ENCOUNTER — Ambulatory Visit: Payer: Self-pay | Admitting: Internal Medicine

## 2020-11-28 ENCOUNTER — Encounter (HOSPITAL_COMMUNITY): Payer: Self-pay

## 2020-11-28 ENCOUNTER — Other Ambulatory Visit: Payer: Self-pay

## 2020-11-28 ENCOUNTER — Ambulatory Visit (INDEPENDENT_AMBULATORY_CARE_PROVIDER_SITE_OTHER): Payer: No Payment, Other | Admitting: *Deleted

## 2020-11-28 VITALS — BP 176/84 | HR 90 | Ht 63.0 in | Wt 204.0 lb

## 2020-11-28 DIAGNOSIS — F200 Paranoid schizophrenia: Secondary | ICD-10-CM

## 2020-11-28 NOTE — Progress Notes (Signed)
PATIENT ARRIVED FOR INJECTION ARIPiprazole Lauroxil ER (ARISTADA) 441 MG/1.6ML prefilled syringe . PATIENT TOLERATED WELL IN LEFT ARM.

## 2020-12-16 ENCOUNTER — Other Ambulatory Visit: Payer: Self-pay | Admitting: Internal Medicine

## 2020-12-25 ENCOUNTER — Ambulatory Visit: Payer: Self-pay | Admitting: Internal Medicine

## 2020-12-26 ENCOUNTER — Other Ambulatory Visit: Payer: Self-pay

## 2020-12-26 ENCOUNTER — Ambulatory Visit (HOSPITAL_COMMUNITY): Payer: No Payment, Other | Admitting: *Deleted

## 2020-12-26 ENCOUNTER — Encounter (HOSPITAL_COMMUNITY): Payer: Self-pay

## 2020-12-26 VITALS — BP 156/72 | HR 93 | Ht 63.0 in | Wt 200.0 lb

## 2020-12-26 DIAGNOSIS — F200 Paranoid schizophrenia: Secondary | ICD-10-CM

## 2020-12-26 NOTE — Progress Notes (Signed)
In accompanied by her husband for her monthly injection of Aristada 441 mg, today she got her shot in her R DELTOID without incident. She is quiet today, offers little and offers no complaints. She states she is ready for Christmas and has no special plans. She is to return in one month for her next injection.

## 2020-12-30 ENCOUNTER — Ambulatory Visit (INDEPENDENT_AMBULATORY_CARE_PROVIDER_SITE_OTHER): Payer: No Payment, Other | Admitting: Psychiatry

## 2020-12-30 ENCOUNTER — Other Ambulatory Visit: Payer: Self-pay

## 2020-12-30 ENCOUNTER — Encounter (HOSPITAL_COMMUNITY): Payer: No Payment, Other | Admitting: Psychiatry

## 2020-12-30 ENCOUNTER — Encounter (HOSPITAL_COMMUNITY): Payer: Self-pay | Admitting: Psychiatry

## 2020-12-30 DIAGNOSIS — F200 Paranoid schizophrenia: Secondary | ICD-10-CM | POA: Diagnosis not present

## 2020-12-30 DIAGNOSIS — G2401 Drug induced subacute dyskinesia: Secondary | ICD-10-CM

## 2020-12-30 MED ORDER — ARISTADA 441 MG/1.6ML IM PRSY: PREFILLED_SYRINGE | INTRAMUSCULAR | 11 refills | Status: DC

## 2020-12-30 MED ORDER — TRAZODONE HCL 100 MG PO TABS: 100.0000 mg | ORAL_TABLET | Freq: Every day | ORAL | 3 refills | Status: DC

## 2020-12-30 MED ORDER — TEMAZEPAM 30 MG PO CAPS: 30.0000 mg | ORAL_CAPSULE | Freq: Every evening | ORAL | 2 refills | Status: DC | PRN

## 2020-12-30 MED ORDER — VALBENAZINE TOSYLATE 40 MG PO CAPS
40.0000 mg | ORAL_CAPSULE | Freq: Every day | ORAL | 3 refills | Status: DC
Start: 2020-12-30 — End: 2021-01-15

## 2020-12-30 NOTE — Progress Notes (Signed)
BH MD/PA/NP OP Progress Note  12/30/2020 2:40 PM Kristine Atkinson  MRN:  [id number]  Chief Complaint:"The shaking medication worked but I ran outTour manager Complaint   Medication Management     HPI: 65 year old female seen today for follow up evaluation.   She has a psychiatric history of anxiety, depression, and paranoid schizophrenia.  She is currently managed on Aristada 441 mg every 28 days, ingrezza 40 mg, Trazodone 50 mg nightly as needed, and Restoril 30 mg nightly.  She notes that her medications are somewhat effective in managing her psychiatric conditions.  Today she is well groomed, pleasant, cooperative, and engaged in conversations.  She informed Probation officer that Julio Alm was effective in controlling her TD however notes that she ran out of the medication.  Provider conducted an aims assessment today and patient scored a 5, at her last visit she scored a 4.  Patient notes that her sleep has also been poor.  She notes that she sleeps 1 to 3 hours nightly.  Patient notes her anxiety and depression are well managed.  Provider conducted GAD-7 and patient scored 8.  Provider also conducted a PHQ-9 and patient scored a 9.  He endorses adequate appetite and sleep.  Today she denies SI/HI/VAH, mania, and paranoia.    Today Ingrezza 40 mg restarted to help manage symptoms of TD.  Trazodone was increased from 50 mg to 100 mg to help manage sleep.  Patient will continue all other medications as prescribed.  No other concerns noted at this time.    Visit Diagnosis:    ICD-10-CM   1. Schizophrenia, paranoid ([place])  F20.0 ARIPiprazole Lauroxil ER (ARISTADA) 441 MG/1.6ML prefilled syringe    temazepam (RESTORIL) 30 MG capsule    traZODone (DESYREL) 100 MG tablet    2. Tardive dyskinesia  G24.01 valbenazine (INGREZZA) 40 MG capsule      Past Psychiatric History: anxiety, depression, and paranoid schizophrenia  Past Medical History:  Past Medical History:  Diagnosis Date   Cellulitis  06/06/2013   Chest pain 06/07/2015   Chronic pain    Cocaine abuse ([place]) 09/09/2012   Cocaine positive on UDS 09/09/2012.     Diabetes mellitus    Elevated serum creatinine 08/14/2013   Fibromyalgia    Greater trochanteric bursitis of left hip 05/29/2014   Hyperlipidemia    Hypertension    Pneumonia due to COVID-19 virus 02/11/2019   Hospitalized with hypoxia, but did not require intubation/ventilation.   Schizophrenia ([place])    Syncope 08/14/2013    Past Surgical History:  Procedure Laterality Date   OOPHORECTOMY     ectopic    Family Psychiatric History: denied  Family History:  Family History  Problem Relation Age of Onset   Hypertension Mother    Cancer Maternal Aunt        colon cancer    Social History:  Social History   Socioeconomic History   Marital status: Married    Spouse name: Not on file   Number of children: Not on file   Years of education: Not on file   Highest education level: Not on file  Occupational History   Not on file  Tobacco Use   Smoking status: Former    Packs/day: 0.20    Years: 41.00    Pack years: 8.20    Types: Cigarettes    Start date: 01/12/1973    Quit date: 06/13/2015    Years since quitting: 5.5   Smokeless tobacco: Never   Tobacco  comments:    smokes about 3-4 cigs per day  as of  02/2014  Substance and Sexual Activity   Alcohol use: No    Alcohol/week: 0.0 standard drinks   Drug use: Not on file   Sexual activity: Not on file  Other Topics Concern   Not on file  Social History Narrative   Lives with husband Abrish Erny).  Has 3 grown children.  Does not work.  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   Social Determinants of Health   Financial Resource Strain: Not on file  Food Insecurity: Not on file  Transportation Needs: Not on file  Physical Activity: Not on file  Stress: Not on file  Social Connections: Not on file    Allergies:  Allergies  Allergen Reactions    Penicillins Itching    Did it involve swelling of the face/tongue/throat, SOB, or low BP? Unknown Did it involve sudden or severe rash/hives, skin peeling, or any reaction on the inside of your mouth or nose? Unknown Did you need to seek medical attention at a hospital or doctor's office? Unknown When did it last happen? Unknown If all above answers are NO, may proceed with cephalosporin use.    Metabolic Disorder Labs: Lab Results  Component Value Date   HGBA1C 6.4 (H) 10/05/2019   No results found for: PROLACTIN Lab Results  Component Value Date   CHOL 138 10/05/2019   TRIG 207 (H) 10/05/2019   HDL 39 (L) 10/05/2019   CHOLHDL 4.8 08/27/2014   VLDL 26 08/27/2014   LDLCALC 65 10/05/2019   LDLCALC 81 06/16/2019   Lab Results  Component Value Date   TSH 0.667 03/02/2012   TSH 1.74 05/13/2011    Therapeutic Level Labs: No results found for: LITHIUM No results found for: VALPROATE No components found for:  CBMZ  Current Medications: Current Outpatient Medications  Medication Sig Dispense Refill   AgaMatrix Ultra-Thin Lancets MISC Check blood glucose twice daily before meals 100 each 11   aspirin 81 MG tablet Take 1 tablet (81 mg total) by mouth daily. 100 tablet 1   carvedilol (COREG) 3.125 MG tablet TAKE 1 TABLET BY MOUTH TWICE A DAY 60 tablet 4   cyclobenzaprine (FLEXERIL) 10 MG tablet TAKE 1 TABLET BY MOUTH THREE TIMES A DAY AS NEEDED FOR MUSCLE SPASM 30 tablet 2   glucose blood (AGAMATRIX PRESTO TEST) test strip Check blood glucose twice daily before meals 100 each 11   hydrochlorothiazide (HYDRODIURIL) 25 MG tablet TAKE 1 TABLET BY MOUTH DAILY 30 tablet 4   losartan (COZAAR) 50 MG tablet TAKE 1 TABLET BY MOUTH DAILY 30 tablet 4   meloxicam (MOBIC) 15 MG tablet TAKE 1 TABLET BY MOUTH DAILY WITH FOOD FOR LEG PAIN 30 tablet 9   metFORMIN (GLUCOPHAGE-XR) 500 MG 24 hr tablet TAKE 1 TABLET BY MOUTH TWICE A DAY WITH MEALS 60 tablet 1   omeprazole (PRILOSEC) 20 MG capsule  TAKE 1 CAPSULE BY MOUTH TWICE A DAY 30 capsule 5   pregabalin (LYRICA) 50 MG capsule Take 1 capsule (50 mg total) by mouth 3 (three) times daily. 270 capsule 3   rosuvastatin (CRESTOR) 20 MG tablet TAKE 1 TABLET BY MOUTH EVERY EVENING WITH SUPPER 30 tablet 1   triamcinolone (KENALOG) 0.1 % APPLY TWICE DAILY WITH TERBINAFINE TO RASH UNTIL RESOLVED. 30 g 1   ARIPiprazole Lauroxil ER (ARISTADA) 441 MG/1.6ML prefilled syringe INJECT 441MG  INTRAMUSCULARLY EVERY 4 WEEKS 1.6 mL 11   temazepam (  RESTORIL) 30 MG capsule Take 1 capsule (30 mg total) by mouth at bedtime as needed for sleep. 30 capsule 2   traZODone (DESYREL) 100 MG tablet Take 1 tablet (100 mg total) by mouth at bedtime. 30 tablet 3   valbenazine (INGREZZA) 40 MG capsule Take 1 capsule (40 mg total) by mouth daily. 30 capsule 3   Current Facility-Administered Medications  Medication Dose Route Frequency Provider Last Rate Last Admin   ARIPiprazole Lauroxil ER (ARISTADA) 441 MG/1.6ML prefilled syringe 441 mg  441 mg Intramuscular Q28 days Eulis Canner E, NP   441 mg at 12/26/20 1615     Musculoskeletal: Strength & Muscle Tone: within normal limits Gait & Station: normal Patient leans: N/A  Psychiatric Specialty Exam: Review of Systems  Blood pressure (!) 146/70, pulse 98, height 5\' 3"  (1.6 m), weight 200 lb (90.7 kg).Body mass index is 35.43 kg/m.  General Appearance: Well Groomed  Eye Contact:  Good  Speech:  Clear and Coherent and Normal Rate  Volume:  Normal  Mood:  Euthymic  Affect:  Appropriate and Congruent  Thought Process:  Coherent, Goal Directed, and Linear  Orientation: WNL  Thought Content: WDL and Logical   Suicidal Thoughts:  No  Homicidal Thoughts:  No  Memory:  Immediate;   Good Recent;   Good Remote;   Good  Judgement:  Good  Insight:  Good  Psychomotor Activity:  TD  Concentration:  Concentration: Good and Attention Span: Good  Recall:  Good  Fund of Knowledge: Good  Language: Good  Akathisia:   No  Handed:  Right  AIMS (if indicated): done, score 5  Assets:  Communication Skills Desire for Improvement Financial Resources/Insurance Housing Intimacy Leisure Time Physical Health Social Support  ADL's:  Intact  Cognition: WNL  Sleep:  Poor   Screenings: AIMS    Flowsheet Row Clinical Support from 12/30/2020 in [facility] from 09/24/2020 in [facility] Total Score 5 4      GAD-7    Flowsheet Row Clinical Support from 12/30/2020 in [facility] Office Visit from 06/25/2020 in [facility]  Total GAD-7 Score 8 17      PHQ2-9    Flowsheet Row Clinical Support from 12/30/2020 in [facility] Office Visit from 06/25/2020 in [facility] Office Visit from 12/27/2015 in [place] Office Visit from 09/05/2015 in [facility] Office Visit from 06/28/2015 in [place]  PHQ-2 Total Score 3 1 0 0 0  PHQ-9 Total Score 9 14 0 0 --        Assessment and Plan: Patient endorses symptoms of insomnia and TD.  Today Ingrezza 40 mg restarted to help manage symptoms of TD.  Trazodone was increased from 50 mg to 100 mg to help manage sleep.  Patient will continue all other medications as prescribed 1. Schizophrenia, paranoid ([place])  Continue- ARIPiprazole Lauroxil ER (ARISTADA) 441 MG/1.6ML prefilled syringe; INJECT 441MG  INTRAMUSCULARLY EVERY 4 WEEKS  Dispense: 1.6 mL; Refill: 11 Continue- temazepam (RESTORIL) 30 MG capsule; Take 1 capsule (30 mg total) by mouth at bedtime as needed for sleep.  Dispense: 30 capsule; Refill: 2 Continue- traZODone (DESYREL) 100 MG tablet; Take 1 tablet (100 mg total) by mouth at bedtime.  Dispense: 30 tablet; Refill: 2  2. Tardive dyskinesia  Restart- valbenazine (INGREZZA) 40 MG capsule; Take 1  capsule (  40 mg total) by mouth daily.  Dispense: 30 capsule; Refill: 3   Follow-up in 3 months    Salley Slaughter, NP 12/30/2020, 2:40 PM

## 2021-01-15 ENCOUNTER — Other Ambulatory Visit: Payer: Self-pay

## 2021-01-15 ENCOUNTER — Other Ambulatory Visit: Payer: Self-pay | Admitting: Internal Medicine

## 2021-01-15 ENCOUNTER — Telehealth (HOSPITAL_COMMUNITY): Payer: Self-pay | Admitting: *Deleted

## 2021-01-15 DIAGNOSIS — K219 Gastro-esophageal reflux disease without esophagitis: Secondary | ICD-10-CM

## 2021-01-15 MED ORDER — OMEPRAZOLE 20 MG PO CPDR: 20.0000 mg | DELAYED_RELEASE_CAPSULE | Freq: Two times a day (BID) | ORAL | 0 refills | Status: DC

## 2021-01-15 MED ORDER — MELOXICAM 15 MG PO TABS: ORAL_TABLET | ORAL | 0 refills | Status: DC

## 2021-01-15 MED ORDER — VALBENAZINE TOSYLATE 40 MG PO CAPS: 40.0000 mg | ORAL_CAPSULE | Freq: Every day | ORAL | 0 refills | Status: DC

## 2021-01-15 NOTE — Telephone Encounter (Signed)
Call from patient stating she is out of Ingrezza and it helped her and she cant afford it as she has no insurance. Will complete patient assistance form for her,and in the meantime will give her a two week sample of it till I can get patient assistance approved.

## 2021-01-16 ENCOUNTER — Telehealth (HOSPITAL_COMMUNITY): Payer: Self-pay | Admitting: Psychiatry

## 2021-01-16 NOTE — Telephone Encounter (Signed)
Patient contacted office requesting to have medications refills for bedtime meds.

## 2021-01-17 ENCOUNTER — Other Ambulatory Visit (HOSPITAL_COMMUNITY): Payer: Self-pay | Admitting: Psychiatry

## 2021-01-17 DIAGNOSIS — F200 Paranoid schizophrenia: Secondary | ICD-10-CM

## 2021-01-17 MED ORDER — TEMAZEPAM 30 MG PO CAPS: 30.0000 mg | ORAL_CAPSULE | Freq: Every evening | ORAL | 2 refills | Status: DC | PRN

## 2021-01-17 MED ORDER — TRAZODONE HCL 100 MG PO TABS: 100.0000 mg | ORAL_TABLET | Freq: Every day | ORAL | 3 refills | Status: DC

## 2021-01-17 NOTE — Telephone Encounter (Signed)
Medication refilled and sent to preferred pharmacy

## 2021-01-21 ENCOUNTER — Ambulatory Visit: Payer: Self-pay | Admitting: Internal Medicine

## 2021-01-27 ENCOUNTER — Ambulatory Visit (HOSPITAL_COMMUNITY): Payer: No Payment, Other

## 2021-01-28 ENCOUNTER — Encounter (HOSPITAL_COMMUNITY): Payer: Self-pay

## 2021-01-28 ENCOUNTER — Ambulatory Visit (INDEPENDENT_AMBULATORY_CARE_PROVIDER_SITE_OTHER): Payer: No Payment, Other | Admitting: *Deleted

## 2021-01-28 VITALS — BP 142/74 | HR 97 | Ht 63.0 in | Wt 205.0 lb

## 2021-01-28 DIAGNOSIS — F200 Paranoid schizophrenia: Secondary | ICD-10-CM | POA: Diagnosis not present

## 2021-01-28 NOTE — Progress Notes (Signed)
Patient arrived for injection ARIPiprazole Lauroxil ER (ARISTADA) 441 MG/1.6ML . Tolerated injection well in Left-Arm.

## 2021-01-31 ENCOUNTER — Telehealth (HOSPITAL_COMMUNITY): Payer: Self-pay | Admitting: *Deleted

## 2021-01-31 NOTE — Telephone Encounter (Signed)
I had expected patients shipment of her Ingrezza today. I called [facility] and it has been delayed and is expected in the office Monday the 23 rd before 2 pm. I tried twice to call to inform patient and or husband but did not get answer or a vm.

## 2021-02-06 ENCOUNTER — Telehealth (HOSPITAL_COMMUNITY): Payer: Self-pay | Admitting: *Deleted

## 2021-02-06 NOTE — Telephone Encounter (Signed)
Call to notify patient her patient assistance Ingrezza came, had called her on Fri and Tues but never got an answer. Today spoke with her husband, he will pick it up today from the front desk.

## 2021-02-25 ENCOUNTER — Telehealth (HOSPITAL_COMMUNITY): Payer: Self-pay | Admitting: *Deleted

## 2021-02-25 ENCOUNTER — Ambulatory Visit (HOSPITAL_COMMUNITY): Payer: No Payment, Other | Admitting: *Deleted

## 2021-02-25 ENCOUNTER — Other Ambulatory Visit: Payer: Self-pay

## 2021-02-25 ENCOUNTER — Encounter (HOSPITAL_COMMUNITY): Payer: Self-pay

## 2021-02-25 VITALS — BP 148/73 | HR 76 | Ht 63.0 in | Wt 206.0 lb

## 2021-02-25 DIAGNOSIS — F200 Paranoid schizophrenia: Secondary | ICD-10-CM

## 2021-02-25 NOTE — Progress Notes (Signed)
In as scheduled for her monthly injection of Aristada 441 mg. I had to give it to her in her R DELTOID today after it clogged up when I put it in her [place] and the needle was changed and new one put on and injected into her R DELTOID. Told her the Julio Alm would be delivered to her house this month rather than coming to the office. 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 She states she doesn't need to do the job, they dont pay her well, so it would be ok if she doesn't get to return. Her next shot is due in one month.

## 2021-02-25 NOTE — Telephone Encounter (Signed)
Call from [place] to coordinate patients shipment of Ingrezza this month. Shipment will be going to patients home this time per discussion with her husband.

## 2021-03-10 ENCOUNTER — Encounter (HOSPITAL_COMMUNITY): Payer: No Payment, Other | Admitting: Psychiatry

## 2021-03-24 ENCOUNTER — Encounter (HOSPITAL_COMMUNITY): Payer: No Payment, Other | Admitting: Psychiatry

## 2021-03-25 ENCOUNTER — Ambulatory Visit: Payer: Self-pay | Admitting: Internal Medicine

## 2021-03-27 ENCOUNTER — Ambulatory Visit (INDEPENDENT_AMBULATORY_CARE_PROVIDER_SITE_OTHER): Payer: No Payment, Other | Admitting: *Deleted

## 2021-03-27 ENCOUNTER — Other Ambulatory Visit: Payer: Self-pay

## 2021-03-27 ENCOUNTER — Encounter (HOSPITAL_COMMUNITY): Payer: Self-pay

## 2021-03-27 ENCOUNTER — Encounter (HOSPITAL_COMMUNITY): Payer: Self-pay | Admitting: Registered Nurse

## 2021-03-27 ENCOUNTER — Ambulatory Visit (INDEPENDENT_AMBULATORY_CARE_PROVIDER_SITE_OTHER): Payer: No Payment, Other | Admitting: Registered Nurse

## 2021-03-27 VITALS — BP 145/79 | HR 91 | Ht 63.0 in | Wt 192.0 lb

## 2021-03-27 DIAGNOSIS — G2401 Drug induced subacute dyskinesia: Secondary | ICD-10-CM

## 2021-03-27 DIAGNOSIS — F200 Paranoid schizophrenia: Secondary | ICD-10-CM

## 2021-03-27 MED ORDER — AUSTEDO 6 MG PO TABS: 6.0000 mg | ORAL_TABLET | Freq: Two times a day (BID) | ORAL | 1 refills | Status: DC

## 2021-03-27 MED ORDER — ARIPIPRAZOLE LAUROXIL ER 441 MG/1.6ML IM PRSY
441.0000 mg | PREFILLED_SYRINGE | Freq: Once | INTRAMUSCULAR | Status: AC
  Administered 2021-04-24: 441 mg via INTRAMUSCULAR

## 2021-03-27 NOTE — Progress Notes (Signed)
BH MD/PA/NP OP Progress Note ? ?03/27/2021 3:08 PM ?Kristine Atkinson  ?MRN:  [id number]  ?Chief Complaint:"The shaking medication worked but I ran out"  ?Chief Complaint   ?Follow up long acting injectable and TD ?  ? ? ?HPI: 66 year old female seen today for follow up evaluation of long acting injectable (Aristada) and TD.   She has a psychiatric history of anxiety, depression, and paranoid schizophrenia.  She is currently managed on Aristada 441 mg every 28 days, Ingrezza 40 mg, Trazodone 50 mg nightly as needed, and Restoril 30 mg nightly.  She reports that she is unable to continue taking the Ingrezza related to stomach cramps, nausea/vomiting.  She has lost 14 pounds since last visit and reports that the weight loss is related to [place].  Ingrezza discontinued and will start Austedo 6 mg twice daily.  Patient in agreement with change.  Reports all other medications are working well for her.  Noted decrease in depression but continues to have anxiety mostly related to not feeling well while taking the Ingrezza.  Reported that the Ingrezza was effective with the TD but unable to continue taking related to the stomach cramps and N/V.  Patient reporting she is sleeping fine with the medication she is taking (Trazodone).  Provider conducted AIMS,  GAD-7, PHQ-9, and C-SSRS see scores below.  Patient reporting no other concerns.   ? ? ?Visit Diagnosis:  ?  ICD-10-CM   ?1. Schizophrenia, paranoid ([place])  F20.0 ARIPiprazole Lauroxil ER (ARISTADA) 441 MG/1.6ML prefilled syringe 441 mg  ?  ?2. Tardive dyskinesia  G24.01 Deutetrabenazine (AUSTEDO) 6 MG TABS  ?  ? ? ?Past Psychiatric History: anxiety, depression, and paranoid schizophrenia ? ?Past Medical History:  ?Past Medical History:  ?Diagnosis Date  ? Cellulitis 06/06/2013  ? Chest pain 06/07/2015  ? Chronic pain   ? Cocaine abuse ([place]) 09/09/2012  ? Cocaine positive on UDS 09/09/2012.    ? Diabetes mellitus   ? Elevated serum creatinine 08/14/2013  ? Fibromyalgia   ?  Greater trochanteric bursitis of left hip 05/29/2014  ? Hyperlipidemia   ? Hypertension   ? Pneumonia due to COVID-19 virus 02/11/2019  ? Hospitalized with hypoxia, but did not require intubation/ventilation.  ? Schizophrenia ([place])   ? Syncope 08/14/2013  ?  ?Past Surgical History:  ?Procedure Laterality Date  ? OOPHORECTOMY    ? ectopic  ? ? ?Family Psychiatric History: denied ? ?Family History:  ?Family History  ?Problem Relation Age of Onset  ? Hypertension Mother   ? Cancer Maternal Aunt   ?     colon cancer  ? ? ?Social History:  ?Social History  ? ?Socioeconomic History  ? Marital status: Married  ?  Spouse name: Not on file  ? Number of children: Not on file  ? Years of education: Not on file  ? Highest education level: Not on file  ?Occupational History  ? Not on file  ?Tobacco Use  ? Smoking status: Former  ?  Packs/day: 0.20  ?  Years: 41.00  ?  Pack years: 8.20  ?  Types: Cigarettes  ?  Start date: 01/12/1973  ?  Quit date: 06/13/2015  ?  Years since quitting: 5.7  ? Smokeless tobacco: Never  ? Tobacco comments:  ?  smokes about 3-4 cigs per day  as of  02/2014  ?Substance and Sexual Activity  ? Alcohol use: No  ?  Alcohol/week: 0.0 standard drinks  ? Drug use: Not on file  ?  Sexual activity: Not on file  ?Other Topics Concern  ? Not on file  ?Social History Narrative  ? Lives with husband Aram Candela).  Has 3 grown children.  Does not work.  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  ? ?Social Determinants of Health  ? ?Financial Resource Strain: Not on file  ?Food Insecurity: Not on file  ?Transportation Needs: Not on file  ?Physical Activity: Not on file  ?Stress: Not on file  ?Social Connections: Not on file  ? ? ?Allergies:  ?Allergies  ?Allergen Reactions  ? Penicillins Itching  ?  Did it involve swelling of the face/tongue/throat, SOB, or low BP? Unknown ?Did it involve sudden or severe rash/hives, skin peeling, or any reaction on the inside of your mouth or nose?  Unknown ?Did you need to seek medical attention at a hospital or doctor's office? Unknown ?When did it last happen? Unknown ?If all above answers are ?NO?, may proceed with cephalosporin use.  ? ? ?Metabolic Disorder Labs: ?Lab Results  ?Component Value Date  ? HGBA1C 6.4 (H) 10/05/2019  ? ?No results found for: PROLACTIN ?Lab Results  ?Component Value Date  ? CHOL 138 10/05/2019  ? TRIG 207 (H) 10/05/2019  ? HDL 39 (L) 10/05/2019  ? CHOLHDL 4.8 08/27/2014  ? VLDL 26 08/27/2014  ? [place] 65 10/05/2019  ? [place] 81 06/16/2019  ? ?Lab Results  ?Component Value Date  ? TSH 0.667 03/02/2012  ? TSH 1.74 05/13/2011  ? ? ?Therapeutic Level Labs: ?No results found for: LITHIUM ?No results found for: VALPROATE ?No components found for:  CBMZ ? ?Current Medications: ?Current Outpatient Medications  ?Medication Sig Dispense Refill  ? Deutetrabenazine (AUSTEDO) 6 MG TABS Take 6 mg by mouth 2 (two) times daily. 60 tablet 1  ? AgaMatrix Ultra-Thin Lancets MISC Check blood glucose twice daily before meals 100 each 11  ? ARIPiprazole Lauroxil ER (ARISTADA) 441 MG/1.6ML prefilled syringe INJECT '441MG'$  INTRAMUSCULARLY EVERY 4 WEEKS 1.6 mL 11  ? aspirin 81 MG tablet Take 1 tablet (81 mg total) by mouth daily. 100 tablet 1  ? carvedilol (COREG) 3.125 MG tablet TAKE 1 TABLET BY MOUTH TWICE A DAY 60 tablet 0  ? cyclobenzaprine (FLEXERIL) 10 MG tablet TAKE 1 TABLET BY MOUTH THREE TIMES A DAY AS NEEDED FOR MUSCLE SPASM 30 tablet 2  ? glucose blood (AGAMATRIX PRESTO TEST) test strip Check blood glucose twice daily before meals 100 each 11  ? hydrochlorothiazide (HYDRODIURIL) 25 MG tablet TAKE 1 TABLET BY MOUTH DAILY 30 tablet 0  ? losartan (COZAAR) 50 MG tablet TAKE 1 TABLET BY MOUTH DAILY 30 tablet 0  ? meloxicam (MOBIC) 15 MG tablet TAKE 1 TABLET BY MOUTH DAILY WITH FOOD FOR LEG PAIN 30 tablet 0  ? metFORMIN (GLUCOPHAGE-XR) 500 MG 24 hr tablet TAKE 1 TABLET BY MOUTH TWICE A DAY WITH MEALS 60 tablet 0  ? omeprazole (PRILOSEC) 20 MG  capsule Take 1 capsule (20 mg total) by mouth 2 (two) times daily. 30 capsule 0  ? pregabalin (LYRICA) 50 MG capsule Take 1 capsule (50 mg total) by mouth 3 (three) times daily. 270 capsule 3  ? rosuvastatin (CRESTOR) 20 MG tablet TAKE 1 TABLET BY MOUTH EVERY EVENING WITH SUPPER 30 tablet 1  ? temazepam (RESTORIL) 30 MG capsule Take 1 capsule (30 mg total) by mouth at bedtime as needed for sleep. 30 capsule 2  ? traZODone (DESYREL) 100 MG tablet Take 1 tablet (100 mg total) by mouth  at bedtime. 30 tablet 3  ? triamcinolone (KENALOG) 0.1 % APPLY TWICE DAILY WITH TERBINAFINE TO RASH UNTIL RESOLVED. 30 g 1  ? ?Current Facility-Administered Medications  ?Medication Dose Route Frequency Provider Last Rate Last Admin  ? ARIPiprazole Lauroxil ER (ARISTADA) 441 MG/1.6ML prefilled syringe 441 mg  441 mg Intramuscular Q28 days Eulis Canner E, NP   441 mg at 01/28/21 1505  ? ARIPiprazole Lauroxil ER (ARISTADA) 441 MG/1.6ML prefilled syringe 441 mg  441 mg Intramuscular Once Jaun Galluzzo B, NP      ? ? ? ?Musculoskeletal: ?Strength & Muscle Tone: within normal limits ?Gait & Station: normal ?Patient leans: N/A ? ?Psychiatric Specialty Exam: ?Review of Systems  ?Constitutional:  Negative for chills, diaphoresis and fatigue. Appetite change: Better since she has stopped the Ingrezza. ?Genitourinary: Negative.   ?Musculoskeletal:   ?     Reporting abnormal movement of legs bilateral sometimes and the abnormal movement of fingers bilateral.  States that the movements have worsened since she stopped the Ingrezza ? ?  ?Neurological:  Positive for tremors.  ?Psychiatric/Behavioral:  Negative for behavioral problems, decreased concentration, dysphoric mood, hallucinations, self-injury and suicidal ideas. Sleep disturbance: States her medication helps with sleep.The patient is nervous/anxious.   ?     Reports that her depression is stable   ?There were no vitals taken for this visit.There is no height or weight on file to  calculate BMI.  ?General Appearance: Well Groomed  ?Eye Contact:  Good  ?Speech:  Clear and Coherent and Normal Rate  ?Volume:  Normal  ?Mood:  Anxious  ?Affect:  Appropriate and Congruent  ?Thought Process:  Coherent, G

## 2021-03-27 NOTE — Progress Notes (Signed)
Patient arrived for Injection  ?Tolerated ARISTADA 441 MG well in Left-Arm. Pleasant as Always ? ?

## 2021-04-09 ENCOUNTER — Telehealth (HOSPITAL_COMMUNITY): Payer: Self-pay | Admitting: Registered Nurse

## 2021-04-09 NOTE — Telephone Encounter (Signed)
Pt calling about AUSTEDA. Patient says Dan Humphreys told her the medication was a one-time fill and to call the doctor to send paperwork for more.

## 2021-04-10 ENCOUNTER — Telehealth (HOSPITAL_COMMUNITY): Payer: Self-pay | Admitting: *Deleted

## 2021-04-10 NOTE — Telephone Encounter (Signed)
Submitted patient assistance form to [employer] which provides Austedo. She has meds from her one time free fill thru [place] till we get back information re patient assistance. ?

## 2021-04-17 ENCOUNTER — Other Ambulatory Visit: Payer: Self-pay

## 2021-04-24 ENCOUNTER — Telehealth (HOSPITAL_COMMUNITY): Payer: Self-pay | Admitting: *Deleted

## 2021-04-24 ENCOUNTER — Encounter (HOSPITAL_COMMUNITY): Payer: Self-pay

## 2021-04-24 ENCOUNTER — Ambulatory Visit (INDEPENDENT_AMBULATORY_CARE_PROVIDER_SITE_OTHER): Payer: No Payment, Other | Admitting: *Deleted

## 2021-04-24 VITALS — BP 179/83 | HR 84 | Ht 63.0 in | Wt 195.0 lb

## 2021-04-24 DIAGNOSIS — F200 Paranoid schizophrenia: Secondary | ICD-10-CM | POA: Diagnosis not present

## 2021-04-24 NOTE — Telephone Encounter (Signed)
Patient In for her shot and brought with her paperwork for patient assistance for Austedo. Paperwork has already been completed and faxed to [employer], [employer] on 3/30. Called manufacturer and told they tried to contact [employer] but without success and sent starter pack to [facility] which is incorrect. Sent message to [place] at [employer] to update her and that company sending a new started pack to them and Probation officer provided [employer] updated address and fax for them. Patient and her husband informed.  ?

## 2021-04-24 NOTE — Progress Notes (Signed)
PATIENT ARRIVED FOR ARIPiprazole Lauroxil ER (ARISTADA) 441 MG/1.6ML  ?INJECTION . TOLERATED WELL IN RIGHT-ARM  & PLEASANT AS ALWAYS. ?

## 2021-05-02 ENCOUNTER — Other Ambulatory Visit: Payer: Self-pay

## 2021-05-09 ENCOUNTER — Other Ambulatory Visit: Payer: Self-pay

## 2021-05-21 ENCOUNTER — Encounter: Payer: Self-pay | Admitting: Internal Medicine

## 2021-05-21 ENCOUNTER — Ambulatory Visit: Payer: Self-pay | Admitting: Internal Medicine

## 2021-05-21 VITALS — BP 160/90 | HR 80 | Resp 16 | Ht 61.5 in | Wt 196.0 lb

## 2021-05-21 DIAGNOSIS — G2401 Drug induced subacute dyskinesia: Secondary | ICD-10-CM

## 2021-05-21 DIAGNOSIS — Z5989 Other problems related to housing and economic circumstances: Secondary | ICD-10-CM

## 2021-05-21 DIAGNOSIS — Z1231 Encounter for screening mammogram for malignant neoplasm of breast: Secondary | ICD-10-CM

## 2021-05-21 DIAGNOSIS — I10 Essential (primary) hypertension: Secondary | ICD-10-CM

## 2021-05-21 DIAGNOSIS — M797 Fibromyalgia: Secondary | ICD-10-CM

## 2021-05-21 DIAGNOSIS — E669 Obesity, unspecified: Secondary | ICD-10-CM

## 2021-05-21 DIAGNOSIS — E1142 Type 2 diabetes mellitus with diabetic polyneuropathy: Secondary | ICD-10-CM

## 2021-05-21 DIAGNOSIS — E785 Hyperlipidemia, unspecified: Secondary | ICD-10-CM

## 2021-05-21 DIAGNOSIS — K219 Gastro-esophageal reflux disease without esophagitis: Secondary | ICD-10-CM

## 2021-05-21 MED ORDER — METFORMIN HCL ER 500 MG PO TB24: 500.0000 mg | ORAL_TABLET | Freq: Two times a day (BID) | ORAL | 11 refills | Status: DC

## 2021-05-21 MED ORDER — HYDROCHLOROTHIAZIDE 25 MG PO TABS: 25.0000 mg | ORAL_TABLET | Freq: Every day | ORAL | 11 refills | Status: DC

## 2021-05-21 MED ORDER — PREGABALIN 50 MG PO CAPS: 50.0000 mg | ORAL_CAPSULE | Freq: Three times a day (TID) | ORAL | 11 refills | Status: DC

## 2021-05-21 MED ORDER — ROSUVASTATIN CALCIUM 20 MG PO TABS: ORAL_TABLET | ORAL | 11 refills | Status: DC

## 2021-05-21 MED ORDER — CARVEDILOL 3.125 MG PO TABS: 3.1250 mg | ORAL_TABLET | Freq: Two times a day (BID) | ORAL | 11 refills | Status: DC

## 2021-05-21 MED ORDER — OMEPRAZOLE 20 MG PO CPDR: 20.0000 mg | DELAYED_RELEASE_CAPSULE | Freq: Two times a day (BID) | ORAL | 11 refills | Status: DC

## 2021-05-21 MED ORDER — LOSARTAN POTASSIUM 50 MG PO TABS: 50.0000 mg | ORAL_TABLET | Freq: Every day | ORAL | 0 refills | Status: DC

## 2021-05-21 NOTE — Progress Notes (Signed)
? ? ?Subjective:  ?  ?Patient ID: Kristine Atkinson, female   DOB: 12-27-1955, 66 y.o.   MRN: [id number] ? ? ?HPI ? ?Has not been seen since 10/03/19 ? ?Ran out of most of her meds about 2 months ago.   ? ?She appears to be following closely with Behavioral Health and feels her symptoms of schizophrenia are fairly well controlled.   ? ? Leg pain:  points to posterior thighs.  Hurts to stand.  Has been off Lyrica for about 1 year now.  Apparently, there is no longer an assistance program through [employer] ? ? ?2.  DM:  not taking meds.  No labs for almost 2 years  Not checking blood glucose.  No eye exam in some time.   ? ? ?3.  Hypertension:  as in #2 ? ? ?4.   Hyperlipidemia:  as above ? ?5.  CKD:  as above.   ? ?6.  Hx of GERD:  not clear she is having much of reflux, then states she is taking her "stomach pills" ? ?Does not have an orange card. ? ?Current Meds  ?Medication Sig  ? ARIPiprazole Lauroxil ER (ARISTADA) 441 MG/1.6ML prefilled syringe INJECT '441MG'$  INTRAMUSCULARLY EVERY 4 WEEKS  ? aspirin 81 MG tablet Take 1 tablet (81 mg total) by mouth daily.  ? Deutetrabenazine (AUSTEDO) 6 MG TABS Take 6 mg by mouth 2 (two) times daily.  ? ?Current Facility-Administered Medications for the 05/21/21 encounter (Office Visit) with Mack Hook, MD  ?Medication  ? ARIPiprazole Lauroxil ER (ARISTADA) 441 MG/1.6ML prefilled syringe 441 mg  ? ?Allergies  ?Allergen Reactions  ? Advil [Ibuprofen] Nausea Only  ? Penicillins Itching  ?  Did it involve swelling of the face/tongue/throat, SOB, or low BP? Unknown ?Did it involve sudden or severe rash/hives, skin peeling, or any reaction on the inside of your mouth or nose? Unknown ?Did you need to seek medical attention at a hospital or doctor's office? Unknown ?When did it last happen? Unknown ?If all above answers are ?NO?, may proceed with cephalosporin use.  ? ? ? ?Review of Systems ? ? ? ?Objective:  ? ?BP (!) 160/90 (BP Location: Right Arm, Patient Position: Sitting, Cuff  Size: Normal)   Pulse 80   Resp 16   Ht 5' 1.5" (1.562 m)   Wt 196 lb (88.9 kg)   BMI 36.43 kg/m?  ? ?Physical Exam ? ?HEENT:  PERRL, EOMI ?Neck:  Supple, No adenopathy, no thyromegaly ?Chest:  CTA ?CV:  RRR with normal S1 and S2, No S3, S4 or murmur.  Carotid, radial and DP PT pulses normal and equal. ?Abd:  S, NT, No HSM or mass, + BS ?LE:  No edema. ?Neuro:  Intermittent resting movement of hands and sometimes left leg. ? ?Diabetic Foot Exam - Simple   ?Simple Foot Form ?Diabetic Foot exam was performed with the following findings: Yes 05/21/2021  4:00 PM  ?Visual Inspection ?See comments: Yes ?Sensation Testing ?Intact to touch and monofilament testing bilaterally: Yes ?Pulse Check ?Posterior Tibialis and Dorsalis pulse intact bilaterally: Yes ?Comments ?Dorsal feet and toes diffusely flaking.  No erythema ?Toenails with thickening and discoloration. ?  ? ? ?Assessment & Plan  ? ? Leg pain:  likely due to being of Lyrica for her fibromyalgia.  Now generic, so not able to apply through [employer].  Generic Rx to [facility]--checking on whether they carry. ? ?2.  DM:  metformin refilled ? ?3.  Hypertension:  Losartan, HCTZ, Carvedilol refilled ? ?  4.  GERD:  omeprazole refilled. ? ?5.  Hyperlipidemia:  Rosuvastatin refilled. ? ?6.  Needs to sign up for Medicare this year--will have her see Tyler/Dulce Olevia Bow[facility] for Medicare evaluation and to be certain has Part D and for any needs. ? ?7.  HM:  Mammogram  Out of shingrix currently ? ?Nonfasting labs:  lipids, CBC, CMP, A1C, urine microalbumin/crea ? ?

## 2021-05-22 ENCOUNTER — Ambulatory Visit (HOSPITAL_COMMUNITY): Payer: No Payment, Other

## 2021-05-22 ENCOUNTER — Encounter (HOSPITAL_COMMUNITY): Payer: Self-pay

## 2021-05-22 VITALS — BP 146/90 | HR 82 | Temp 99.3°F | Ht 62.75 in | Wt 194.0 lb

## 2021-05-22 DIAGNOSIS — F200 Paranoid schizophrenia: Secondary | ICD-10-CM

## 2021-05-22 LAB — LIPID PANEL W/O CHOL/HDL RATIO
Cholesterol, Total: 219 mg/dL — ABNORMAL HIGH (ref 100–199)
HDL: 55 mg/dL (ref >39)
LDL Chol Calc (NIH): 136 mg/dL — ABNORMAL HIGH (ref 0–99)
Triglycerides: 157 mg/dL — ABNORMAL HIGH (ref 0–149)
VLDL Cholesterol Cal: 28 mg/dL (ref 5–40)

## 2021-05-22 LAB — TSH: TSH: 0.657 u[IU]/mL (ref 0.450–4.500)

## 2021-05-22 LAB — CBC WITH DIFFERENTIAL/PLATELET
Basophils Absolute: 0.1 10*3/uL (ref 0.0–0.2)
Basos: 1 %
EOS (ABSOLUTE): 0.1 10*3/uL (ref 0.0–0.4)
Eos: 1 %
Hematocrit: 40.3 % (ref 34.0–46.6)
Hemoglobin: 13.3 g/dL (ref 11.1–15.9)
Immature Grans (Abs): 0 10*3/uL (ref 0.0–0.1)
Immature Granulocytes: 0 %
Lymphocytes Absolute: 2.5 10*3/uL (ref 0.7–3.1)
Lymphs: 30 %
MCH: 28.2 pg (ref 26.6–33.0)
MCHC: 33 g/dL (ref 31.5–35.7)
MCV: 86 fL (ref 79–97)
Monocytes Absolute: 0.6 10*3/uL (ref 0.1–0.9)
Monocytes: 7 %
Neutrophils Absolute: 5.1 10*3/uL (ref 1.4–7.0)
Neutrophils: 61 %
Platelets: 441 10*3/uL (ref 150–450)
RBC: 4.71 x10E6/uL (ref 3.77–5.28)
RDW: 15 % (ref 11.7–15.4)
WBC: 8.4 10*3/uL (ref 3.4–10.8)

## 2021-05-22 LAB — COMPREHENSIVE METABOLIC PANEL
ALT: 12 IU/L (ref 0–32)
AST: 14 IU/L (ref 0–40)
Albumin/Globulin Ratio: 1.6 (ref 1.2–2.2)
Albumin: 4.2 g/dL (ref 3.8–4.8)
Alkaline Phosphatase: 124 IU/L — ABNORMAL HIGH (ref 44–121)
BUN/Creatinine Ratio: 11 — ABNORMAL LOW (ref 12–28)
BUN: 9 mg/dL (ref 8–27)
Bilirubin Total: <0.2 mg/dL (ref 0.0–1.2)
CO2: 22 mmol/L (ref 20–29)
Calcium: 9.4 mg/dL (ref 8.7–10.3)
Chloride: 104 mmol/L (ref 96–106)
Creatinine, Ser: 0.79 mg/dL (ref 0.57–1.00)
Globulin, Total: 2.7 g/dL (ref 1.5–4.5)
Glucose: 98 mg/dL (ref 70–99)
Potassium: 4.6 mmol/L (ref 3.5–5.2)
Sodium: 140 mmol/L (ref 134–144)
Total Protein: 6.9 g/dL (ref 6.0–8.5)
eGFR: 83 mL/min/{1.73_m2} (ref >59)

## 2021-05-22 LAB — MICROALBUMIN / CREATININE URINE RATIO
Creatinine, Urine: 88.6 mg/dL
Microalb/Creat Ratio: 10 mg/g creat (ref 0–29)
Microalbumin, Urine: 8.6 ug/mL

## 2021-05-22 LAB — HGB A1C W/O EAG: Hgb A1c MFr Bld: 5.8 % — ABNORMAL HIGH (ref 4.8–5.6)

## 2021-05-22 NOTE — Progress Notes (Signed)
Patient resented with flat affect, agitated mood but denied any auditory or visual hallucinations, no suicidal or homicidal ideations and no plan or intent to want to harm self or others.  Patient stated she still had not received her starter packet for Austedo.  Shuvon Rankin, NP reviewed the medication with patient and provided her a 4 week starter packet sample.  Patient stated understanding instructions for taking the medication.  Patient's ordered Aristada 441 mg/1.6 ml injection prepared and administered as ordered in patient's right deltoid this date.  Patient denied any pain or discomfort from the injection and agreed to return in 4 weeks for the next due injection.  Patient admitted she was aggravated a little having to retake blood pressure this date as was a little high at 146/90 and doing rechecks was causing discomfort so patient requested to not check it again and stated we will check it again next time as she was in a hurry this date.  Agreed with patient's plan and she will call if any issues prior to next appt.   ?

## 2021-06-19 ENCOUNTER — Ambulatory Visit (HOSPITAL_COMMUNITY): Payer: No Payment, Other

## 2021-06-19 ENCOUNTER — Ambulatory Visit (INDEPENDENT_AMBULATORY_CARE_PROVIDER_SITE_OTHER): Payer: No Payment, Other | Admitting: Registered Nurse

## 2021-06-19 ENCOUNTER — Encounter (HOSPITAL_COMMUNITY): Payer: Self-pay | Admitting: Registered Nurse

## 2021-06-19 DIAGNOSIS — F200 Paranoid schizophrenia: Secondary | ICD-10-CM | POA: Diagnosis not present

## 2021-06-19 DIAGNOSIS — G2401 Drug induced subacute dyskinesia: Secondary | ICD-10-CM

## 2021-06-19 NOTE — Progress Notes (Signed)
BH MD/PA/NP OP Progress Note  06/19/2021 3:48 PM Kristine Atkinson  MRN:  [id number]  Chief Complaint:  Chief Complaint  Patient presents with   Follow up medication management and psychiatric evaluation   HPI: Kristine Atkinson 66 y.o. female seen in office today for follow up medication management and psychiatric evaluation.  [name] was started on Austedo (titration kit) 05/22/2021.  She should be starting on packet for weeks 3 but states she is still on packet for week 1-2.  She reports she has been taking it and that she can tell she has had some improvement "I'm not shaking as much."  Encouraged to complete the dose packet and then would place an order at her pharmacy.  Want to make sure it is helping before she has to pick up from pharmacy.  Went over the instruction on how to take medication again and she voiced understanding.   [name] has a psychiatric history of anxiety, depression, and paranoid schizophrenia.  She is currently managed on Aristada 441 mg every 28 days, Austedo (titration pack at this time), Trazodone 50 mg nightly as needed, and Restoril 30 mg nightly.  She reports she is tolerating medications with no adverse reaction at this time.    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  It's how she talks to me."  She states she is eating without difficulty but has trouble falling to sleep even when she takes her medication.  Reports that she does sleep on and off during the week.  Educated on sleep during the day will cause more trouble with falling to sleep at night.  Informed to find something that will keep her active during the day (less sleep during the day) to see if it will improve night sleep before we increase dosage of sleep medication.  Understanding voiced.  Provider conducted AIMS, GAD-7, PHQ-9, and C-SSRS see scores below.  Patient reporting no other  concerns.     Visit Diagnosis:    ICD-10-CM   1. Schizophrenia, paranoid ([place])  F20.0     2. Tardive dyskinesia  G24.01       Past Psychiatric History: anxiety, depression, and paranoid schizophrenia  Past Medical History:  Past Medical History:  Diagnosis Date   Cellulitis 06/06/2013   Chest pain 06/07/2015   Chronic pain    Cocaine abuse ([place]) 09/09/2012   Cocaine positive on UDS 09/09/2012.     Diabetes mellitus    Elevated serum creatinine 08/14/2013   Fibromyalgia    Greater trochanteric bursitis of left hip 05/29/2014   Hyperlipidemia    Hypertension    Pneumonia due to COVID-19 virus 02/11/2019   Hospitalized with hypoxia, but did not require intubation/ventilation.   Schizophrenia ([place])    Syncope 08/14/2013    Past Surgical History:  Procedure Laterality Date   OOPHORECTOMY     ectopic    Family Psychiatric History: None reported  Family History:  Family History  Problem Relation Age of Onset   Hypertension Mother    Cancer Maternal Aunt        colon cancer    Social History:  Social History   Socioeconomic History   Marital status: Married    Spouse name: Not on file   Number of children: Not on file   Years of education: Not on file   Highest education level: Not  on file  Occupational History   Not on file  Tobacco Use   Smoking status: Former    Packs/day: 0.20    Years: 41.00    Total pack years: 8.20    Types: Cigarettes    Start date: 01/12/1973    Quit date: 06/13/2015    Years since quitting: 6.0   Smokeless tobacco: Never   Tobacco comments:    smokes about 3-4 cigs per day  as of  02/2014  Substance and Sexual Activity   Alcohol use: No    Alcohol/week: 0.0 standard drinks of alcohol   Drug use: Not Currently   Sexual activity: Not Currently  Other Topics Concern   Not on file  Social History Narrative   Lives with husband Ellora Varnum).  Has 3 grown children.  Does not work.  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   Social Determinants of Health   Financial Resource Strain: Not on file  Food Insecurity: Not on file  Transportation Needs: Not on file  Physical Activity: Not on file  Stress: Not on file  Social Connections: Not on file    Allergies:  Allergies  Allergen Reactions   Advil [Ibuprofen] Nausea Only   Penicillins Itching    Did it involve swelling of the face/tongue/throat, SOB, or low BP? Unknown Did it involve sudden or severe rash/hives, skin peeling, or any reaction on the inside of your mouth or nose? Unknown Did you need to seek medical attention at a hospital or doctor's office? Unknown When did it last happen? Unknown If all above answers are "NO", may proceed with cephalosporin use.    Metabolic Disorder Labs: Lab Results  Component Value Date   HGBA1C 5.8 (H) 05/21/2021   No results found for: "PROLACTIN" Lab Results  Component Value Date   CHOL 219 (H) 05/21/2021   TRIG 157 (H) 05/21/2021   HDL 55 05/21/2021   CHOLHDL 4.8 08/27/2014   VLDL 26 08/27/2014   LDLCALC 136 (H) 05/21/2021   LDLCALC 65 10/05/2019   Lab Results  Component Value Date   TSH 0.657 05/21/2021   TSH 0.667 03/02/2012    Therapeutic Level Labs: No results found for: "LITHIUM" No results found for: "VALPROATE" No results found for: "CBMZ"  Current Medications: Current Outpatient Medications  Medication Sig Dispense Refill   AgaMatrix Ultra-Thin Lancets MISC Check blood glucose twice daily before meals (Patient not taking: Reported on 05/21/2021) 100 each 11   ARIPiprazole Lauroxil ER (ARISTADA) 441 MG/1.6ML prefilled syringe INJECT 441MG INTRAMUSCULARLY EVERY 4 WEEKS 1.6 mL 11   aspirin 81 MG tablet Take 1 tablet (81 mg total) by mouth daily. 100 tablet 1   carvedilol (COREG) 3.125 MG tablet Take 1 tablet (3.125 mg total) by mouth 2 (two) times daily. 60 tablet 11   Deutetrabenazine (AUSTEDO) 6 MG TABS Take 6 mg by mouth 2 (two) times daily. 60 tablet 1    glucose blood (AGAMATRIX PRESTO TEST) test strip Check blood glucose twice daily before meals (Patient not taking: Reported on 05/21/2021) 100 each 11   hydrochlorothiazide (HYDRODIURIL) 25 MG tablet Take 1 tablet (25 mg total) by mouth daily. 30 tablet 11   losartan (COZAAR) 50 MG tablet Take 1 tablet (50 mg total) by mouth daily. 30 tablet 0   meloxicam (MOBIC) 15 MG tablet TAKE 1 TABLET BY MOUTH DAILY WITH FOOD FOR LEG PAIN (Patient not taking: Reported on 05/21/2021) 30 tablet 0   metFORMIN (GLUCOPHAGE-XR) 500 MG  24 hr tablet Take 1 tablet (500 mg total) by mouth 2 (two) times daily with a meal. 60 tablet 11   omeprazole (PRILOSEC) 20 MG capsule Take 1 capsule (20 mg total) by mouth 2 (two) times daily. 30 capsule 11   pregabalin (LYRICA) 50 MG capsule Take 1 capsule (50 mg total) by mouth 3 (three) times daily. 90 capsule 11   rosuvastatin (CRESTOR) 20 MG tablet TAKE 1 TABLET BY MOUTH EVERY EVENING WITH SUPPER 30 tablet 11   temazepam (RESTORIL) 30 MG capsule Take 1 capsule (30 mg total) by mouth at bedtime as needed for sleep. (Patient not taking: Reported on 05/21/2021) 30 capsule 2   traZODone (DESYREL) 100 MG tablet Take 1 tablet (100 mg total) by mouth at bedtime. (Patient not taking: Reported on 05/21/2021) 30 tablet 3   triamcinolone (KENALOG) 0.1 % APPLY TWICE DAILY WITH TERBINAFINE TO RASH UNTIL RESOLVED. (Patient not taking: Reported on 05/21/2021) 30 g 1   Current Facility-Administered Medications  Medication Dose Route Frequency Provider Last Rate Last Admin   ARIPiprazole Lauroxil ER (ARISTADA) 441 MG/1.6ML prefilled syringe 441 mg  441 mg Intramuscular Q28 days Eulis Canner E, NP   441 mg at 01/28/21 1505     Musculoskeletal: Strength & Muscle Tone: within normal limits Gait & Station: normal Patient leans: N/A  Psychiatric Specialty Exam: Review of Systems  Constitutional: Negative.   HENT: Negative.    Eyes: Negative.   Respiratory: Negative.    Cardiovascular:  Negative.   Gastrointestinal: Negative.   Genitourinary: Negative.   Musculoskeletal:        Abnormal movement TD   Skin: Negative.   Neurological: Negative.   Hematological: Negative.   Psychiatric/Behavioral:  Negative for agitation, confusion, hallucinations, self-injury and suicidal ideas. Dysphoric mood: Stable.The patient is not hyperactive. Nervous/anxious: Stable.    There were no vitals taken for this visit.There is no height or weight on file to calculate BMI.  General Appearance: Casual  Eye Contact:  Good  Speech:  Clear and Coherent and Normal Rate  Volume:  Normal  Mood:  Euthymic  Affect:  Appropriate and Congruent  Thought Process:  Coherent, Goal Directed, and Descriptions of Associations: Intact  Orientation:  Full (Time, Place, and Person)  Thought Content: WDL and Logical   Suicidal Thoughts:  No  Homicidal Thoughts:  No  Memory:  Immediate;   Good Recent;   Good Remote;   Good  Judgement:  Intact  Insight:  Present  Psychomotor Activity:  Normal  Concentration:  Concentration: Good and Attention Span: Good  Recall:  Good  Fund of Knowledge: Good  Language: Good  Akathisia:  No  Handed:  Right  AIMS (if indicated): done  Assets:  Communication Skills Desire for Improvement Financial Resources/Insurance Housing Resilience Social Support  ADL's:  Intact  Cognition: WNL  Sleep:  Fair   Screenings: [place] Office Visit from 06/19/2021 in [place] from 03/27/2021 in [facility] from 12/30/2020 in [place] from 09/24/2020 in [facility] Total Score 23 7 5 4       GAD-7    [place] Office Visit from 06/19/2021 in [facility] from 03/27/2021 in [place] from 12/30/2020  in [facility] Office Visit from 06/25/2020 in [facility]  Total GAD-7  Score 1 7 8 17       PHQ2-9    [place] Visit from 06/19/2021 in [place] from 03/27/2021 in [facility] Clinical Support from 12/30/2020 in [facility] Office Visit from 06/25/2020 in [facility] Office Visit from 12/27/2015 in [place]  PHQ-2 Total Score 1 0 3 1 0  PHQ-9 Total Score -- -- 9 14 [place] Office Visit from 06/19/2021 in [place] from 03/27/2021 in [place] No Risk No Risk      Assessment and Plan: SERRINA MINOGUE appears to be doing well.  She reports that medications are effective and managing her mental health without any adverse reaction.  During visit she is dressed appropriate for weather.  She is sitting upright in chair with no noted distress.  She is alert/oriented x 4, calm/cooperative and mood is congruent with affect.  She spoke in a clear tone at moderate volume, and normal pace, with good eye contact.  Her thought process is coherent and relevant; and there is no indication that she is currently responding to internal/external stimuli, or experiencing delusional thought content.  She denies suicidal/self-harm/homicidal ideation, psychosis, paranoia, and fluctuations in mood.  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  Encouraged to take medications as ordered and will order the Austedo once she has finished the titration packet.    Collaboration of Care: Collaboration of Care: Medication Management AEB Education on Austedo for  TD.  Patient/Guardian was advised Release of Information must be obtained prior to any record release in order to collaborate their care with an outside provider. Patient/Guardian was advised if they have not already done so to contact the registration department to sign all necessary forms in order for [place] to release information regarding their care.   Consent: Patient/Guardian gives verbal consent for treatment and assignment of benefits for services provided during this visit. Patient/Guardian expressed understanding and agreed to proceed.   Follow up next week for Aristada (LAI) and 2 months for psychiatric evaluation.    Kirstina Leinweber, NP 06/19/2021, 3:48 PM

## 2021-06-19 NOTE — Progress Notes (Signed)
She was started on Ingrezza but couldn't tolerated related to stomach cramping and diarrhea.  She was switched to Austedo (Patient titration Kit) given.  She is currently on first pack weeks 1-2.  Reports she has noticed some improvement "I'm not shaking as much."

## 2021-06-24 ENCOUNTER — Ambulatory Visit (INDEPENDENT_AMBULATORY_CARE_PROVIDER_SITE_OTHER): Payer: No Payment, Other | Admitting: *Deleted

## 2021-06-24 ENCOUNTER — Encounter (HOSPITAL_COMMUNITY): Payer: Self-pay

## 2021-06-24 ENCOUNTER — Ambulatory Visit (HOSPITAL_COMMUNITY): Payer: No Payment, Other

## 2021-06-24 VITALS — BP 148/74 | HR 92 | Ht 62.0 in | Wt 197.0 lb

## 2021-06-24 DIAGNOSIS — F200 Paranoid schizophrenia: Secondary | ICD-10-CM

## 2021-07-01 ENCOUNTER — Other Ambulatory Visit: Payer: Self-pay | Admitting: Internal Medicine

## 2021-07-01 ENCOUNTER — Ambulatory Visit: Payer: Self-pay | Admitting: Internal Medicine

## 2021-07-22 ENCOUNTER — Ambulatory Visit: Payer: Self-pay | Admitting: Internal Medicine

## 2021-07-24 ENCOUNTER — Encounter (HOSPITAL_COMMUNITY): Payer: Self-pay

## 2021-07-24 ENCOUNTER — Other Ambulatory Visit (HOSPITAL_COMMUNITY): Payer: Self-pay | Admitting: Registered Nurse

## 2021-07-24 ENCOUNTER — Ambulatory Visit (INDEPENDENT_AMBULATORY_CARE_PROVIDER_SITE_OTHER): Payer: No Payment, Other | Admitting: *Deleted

## 2021-07-24 ENCOUNTER — Telehealth (HOSPITAL_COMMUNITY): Payer: Self-pay | Admitting: *Deleted

## 2021-07-24 VITALS — BP 120/71 | HR 92 | Resp 18 | Ht 62.0 in | Wt 200.0 lb

## 2021-07-24 DIAGNOSIS — F200 Paranoid schizophrenia: Secondary | ICD-10-CM

## 2021-07-24 MED ORDER — AUSTEDO PATIENT TITRATION KIT 6 & 9 & 12 MG PO TBPK: ORAL_TABLET | ORAL | 0 refills | Status: AC

## 2021-07-24 MED ORDER — ARISTADA 441 MG/1.6ML IM PRSY: PREFILLED_SYRINGE | INTRAMUSCULAR | 3 refills | Status: DC

## 2021-07-24 NOTE — Telephone Encounter (Signed)
Called Shuvon NP and she gave Probation officer a verbal order for patient to have a titration pack of Austedo which is for 4 weeks. Called [facility] and spoke with her and her husband re samples for her at front desk for tomorrow and complete app on tues when the provider is here in the office.

## 2021-07-24 NOTE — Progress Notes (Signed)
In for her monthly injection of Aristada 441 mg. Given today in her L DELTOID without issue. She is asking for her "shaking " medicine. An application for patient assistance was submitted in mid march, I will follow up with it now as patient says she doesn't have any. I called [employer] and they referred me to [place] but patient has no insurance and [facility] does not manage our patient assistance meds. Called [place] for clarification and they gave it to her per [employer] with a coupon a one time rx. Called [employer] back, they ended the app because they were never able to reach her and it is required they make contact with patient or the significant other, not this clinic. She is able to call [employer] and I new app needs to be completed. To return in 28 days. Will ask shuvon np to provide samples and complete a new app and encourage patient to answer her phone so [employer] can make contact with her and arrange delievery.

## 2021-07-24 NOTE — Addendum Note (Signed)
Addended by: Davina Poke on: 07/24/2021 03:30 PM   Modules accepted: Orders

## 2021-07-29 ENCOUNTER — Other Ambulatory Visit (HOSPITAL_COMMUNITY): Payer: Self-pay | Admitting: Registered Nurse

## 2021-07-29 MED ORDER — AUSTEDO 12 MG PO TABS: 12.0000 mg | ORAL_TABLET | Freq: Two times a day (BID) | ORAL | 6 refills | Status: DC

## 2021-08-26 ENCOUNTER — Ambulatory Visit (INDEPENDENT_AMBULATORY_CARE_PROVIDER_SITE_OTHER): Payer: No Payment, Other | Admitting: *Deleted

## 2021-08-26 ENCOUNTER — Encounter (HOSPITAL_COMMUNITY): Payer: Self-pay

## 2021-08-26 VITALS — BP 152/80 | HR 100 | Ht 62.0 in | Wt 201.0 lb

## 2021-08-26 DIAGNOSIS — F200 Paranoid schizophrenia: Secondary | ICD-10-CM

## 2021-08-26 MED ORDER — ARIPIPRAZOLE LAUROXIL ER 441 MG/1.6ML IM PRSY
441.0000 mg | PREFILLED_SYRINGE | Freq: Once | INTRAMUSCULAR | Status: AC
  Administered 2021-08-26: 441 mg via INTRAMUSCULAR

## 2021-08-26 NOTE — Progress Notes (Signed)
Patient arrived for Injection ARIPiprazole Lauroxil ER (ARISTADA) 441 MG Ple[place] as Usual Tolerated injection well in Right Arm 

## 2021-09-08 ENCOUNTER — Ambulatory Visit: Payer: Self-pay | Admitting: Internal Medicine

## 2021-09-18 ENCOUNTER — Ambulatory Visit (HOSPITAL_COMMUNITY): Payer: Self-pay

## 2021-09-18 ENCOUNTER — Encounter (HOSPITAL_COMMUNITY): Payer: Self-pay | Admitting: Registered Nurse

## 2021-09-18 ENCOUNTER — Ambulatory Visit (INDEPENDENT_AMBULATORY_CARE_PROVIDER_SITE_OTHER): Payer: No Payment, Other | Admitting: Registered Nurse

## 2021-09-18 ENCOUNTER — Encounter (HOSPITAL_COMMUNITY): Payer: Self-pay

## 2021-09-18 ENCOUNTER — Telehealth (HOSPITAL_COMMUNITY): Payer: Self-pay | Admitting: *Deleted

## 2021-09-18 VITALS — Ht 62.0 in | Wt 201.0 lb

## 2021-09-18 DIAGNOSIS — F99 Mental disorder, not otherwise specified: Secondary | ICD-10-CM

## 2021-09-18 DIAGNOSIS — F200 Paranoid schizophrenia: Secondary | ICD-10-CM

## 2021-09-18 DIAGNOSIS — F5105 Insomnia due to other mental disorder: Secondary | ICD-10-CM

## 2021-09-18 DIAGNOSIS — G2401 Drug induced subacute dyskinesia: Secondary | ICD-10-CM | POA: Diagnosis not present

## 2021-09-18 MED ORDER — ARIPIPRAZOLE LAUROXIL ER 441 MG/1.6ML IM PRSY
441.0000 mg | PREFILLED_SYRINGE | Freq: Once | INTRAMUSCULAR | Status: AC
  Administered 2021-09-18: 441 mg via INTRAMUSCULAR

## 2021-09-18 MED ORDER — TEMAZEPAM 30 MG PO CAPS: 30.0000 mg | ORAL_CAPSULE | Freq: Every evening | ORAL | 2 refills | Status: DC | PRN

## 2021-09-18 MED ORDER — ARISTADA 441 MG/1.6ML IM PRSY: PREFILLED_SYRINGE | INTRAMUSCULAR | 3 refills | Status: DC

## 2021-09-18 MED ORDER — AUSTEDO 12 MG PO TABS: 12.0000 mg | ORAL_TABLET | Freq: Two times a day (BID) | ORAL | 6 refills | Status: DC

## 2021-09-18 MED ORDER — TRAZODONE HCL 100 MG PO TABS: 100.0000 mg | ORAL_TABLET | Freq: Every day | ORAL | 3 refills | Status: DC

## 2021-09-18 MED ORDER — MELATONIN 5 MG PO TABS: 5.0000 mg | ORAL_TABLET | Freq: Every day | ORAL | 2 refills | Status: DC

## 2021-09-18 NOTE — Progress Notes (Signed)
BH MD/PA/NP OP Progress Note  09/18/2021 3:31 PM Kristine [place]  MRN:  [id number]  Chief Complaint:  Chief Complaint  Patient presents with   Followup psychiatric evaluation and administration of long    HPI: Kristine [place] 56 yr. female seen face to face in [place] today for follow up psychiatric evaluation and administration of long acting injectable.  Her psychiatric history includes paranoid schizophrenia and tardive dyskinesia (TD).  Her mental health is managed with Aristada 441 mg every 28 days, Austedo (titration pack at this time), Trazodone 50 mg nightly as needed, and Restoril 30 mg nightly.  For her TD she was initially started on Ingrezza which she could not tolerate.  She was then started on the dose pack of Austedo which she informed improved TD.  However, after completing the dose pack (samples) she was unable to get medications related to not following up with company that assist with medication program (afford medication).  Ms. Garinger was restarted on dose pack and instructed that she would need to speak with company when they called her home.  Again, patient not answering her phone and not follow up with company.  Company called while Kristine [place].  Reports she has one week of samples left.  Kristine [place] states she has been taking Austedo as ordered and that there has been improvement in movement.  This Probation officer has noticed no improvement and that there is a possibility that Kristine [place] is not taking the medications.  She reports she has been tolerating medications with no adverse reaction and medications continue to manage mental health.   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  Today she denies both depression and anxiety.  Reports she is eating without difficulty.  Last visit reported she had trouble falling to sleep but once she went to sleep, she slept well throughout the night.  Also reported that she slept on/off  during day hours.  Education on good sleep hygiene and finding an activity to keep her busy during day hours to prevent sleep during day.  Today she reports she states she continues to have issues with sleeping recommended starting Melatonin.  Will order 5 mg Q hs. She denies depression, anxiety, fluctuations in mood, abnormal movement, suicidal/self-harm/homicidal ideation, psychosis, and paranoia.  AIMS, PHQ 2 & 9, C-SSRS, and GAD 7 screenings conducted by provider see scores below.  Visit Diagnosis:    ICD-10-CM   1. Schizophrenia, paranoid ([place])  F20.0 traZODone (DESYREL) 100 MG tablet    temazepam (RESTORIL) 30 MG capsule    ARIPiprazole Lauroxil ER (ARISTADA) 441 MG/1.6ML prefilled syringe    2. Tardive dyskinesia  G24.01 Deutetrabenazine (AUSTEDO) 12 MG TABS    3. Insomnia due to other mental disorder  F51.05 melatonin 5 MG TABS   F99       Past Psychiatric History:  anxiety, depression, and paranoid schizophrenia    Past Medical History:  Past Medical History:  Diagnosis Date   Cellulitis 06/06/2013   Chest pain 06/07/2015   Chronic pain    Cocaine abuse ([place]) 09/09/2012   Cocaine positive on UDS 09/09/2012.     Diabetes mellitus    Elevated serum creatinine 08/14/2013   Fibromyalgia    Greater trochanteric bursitis of left hip 05/29/2014   Hyperlipidemia    Hypertension    Pneumonia due to COVID-19 virus 02/11/2019   Hospitalized with hypoxia, but did not require intubation/ventilation.   Schizophrenia ([place])    Syncope 08/14/2013  Past Surgical History:  Procedure Laterality Date   OOPHORECTOMY     ectopic    Family Psychiatric History: None reported  Family History:  Family History  Problem Relation Age of Onset   Hypertension Mother    Cancer Maternal Aunt        colon cancer    Social History:  Social History   Socioeconomic History   Marital status: Married    Spouse name: Not on file   Number of children: Not on file   Years of education: Not  on file   Highest education level: Not on file  Occupational History   Not on file  Tobacco Use   Smoking status: Former    Packs/day: 0.20    Years: 41.00    Total pack years: 8.20    Types: Cigarettes    Start date: 01/12/1973    Quit date: 06/13/2015    Years since quitting: 6.2   Smokeless tobacco: Never   Tobacco comments:    smokes about 3-4 cigs per day  as of  02/2014  Substance and Sexual Activity   Alcohol use: No    Alcohol/week: 0.0 standard drinks of alcohol   Drug use: Not Currently   Sexual activity: Not Currently  Other Topics Concern   Not on file  Social History Narrative   Lives with husband Chester Romero).  Has 3 grown children.  Does not work.  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   Social Determinants of Health   Financial Resource Strain: Not on file  Food Insecurity: No Food Insecurity (10/03/2019)   Hunger Vital Sign    Worried About Running Out of Food in the Last Year: Never true    Ran Out of Food in the Last Year: Never true  Transportation Needs: No Transportation Needs (10/03/2019)   PRAPARE - Hydrologist (Medical): No    Lack of Transportation (Non-Medical): No  Physical Activity: Not on file  Stress: Not on file  Social Connections: Not on file    Allergies:  Allergies  Allergen Reactions   Advil [Ibuprofen] Nausea Only   Penicillins Itching    Did it involve swelling of the face/tongue/throat, SOB, or low BP? Unknown Did it involve sudden or severe rash/hives, skin peeling, or any reaction on the inside of your mouth or nose? Unknown Did you need to seek medical attention at a hospital or doctor's [place]? Unknown When did it last happen? Unknown If all above answers are "NO", may proceed with cephalosporin use.    Metabolic Disorder Labs: Lab Results  Component Value Date   HGBA1C 5.8 (H) 05/21/2021   No results found for: "PROLACTIN" Lab Results  Component Value Date    CHOL 219 (H) 05/21/2021   TRIG 157 (H) 05/21/2021   HDL 55 05/21/2021   CHOLHDL 4.8 08/27/2014   VLDL 26 08/27/2014   LDLCALC 136 (H) 05/21/2021   LDLCALC 65 10/05/2019   Lab Results  Component Value Date   TSH 0.657 05/21/2021   TSH 0.667 03/02/2012    Therapeutic Level Labs: No results found for: "LITHIUM" No results found for: "VALPROATE" No results found for: "CBMZ"  Current Medications: Current Outpatient Medications  Medication Sig Dispense Refill   melatonin 5 MG TABS Take 1 tablet (5 mg total) by mouth at bedtime. 30 tablet 2   AgaMatrix Ultra-Thin Lancets MISC Check blood glucose twice daily before meals 100 each 11   ARIPiprazole Lauroxil  ER (ARISTADA) 441 MG/1.6ML prefilled syringe INJECT '441MG'$  INTRAMUSCULARLY EVERY 4 WEEKS 1.6 mL 3   aspirin 81 MG tablet Take 1 tablet (81 mg total) by mouth daily. 100 tablet 1   carvedilol (COREG) 3.125 MG tablet Take 1 tablet (3.125 mg total) by mouth 2 (two) times daily. 60 tablet 11   Deutetrabenazine (AUSTEDO) 12 MG TABS Take 12 mg by mouth 2 (two) times daily. 60 tablet 6   glucose blood (AGAMATRIX PRESTO TEST) test strip Check blood glucose twice daily before meals 100 each 11   hydrochlorothiazide (HYDRODIURIL) 25 MG tablet Take 1 tablet (25 mg total) by mouth daily. 30 tablet 11   losartan (COZAAR) 50 MG tablet TAKE 1 TABLET BY MOUTH DAILY 30 tablet 11   meloxicam (MOBIC) 15 MG tablet TAKE 1 TABLET BY MOUTH DAILY WITH FOOD FOR LEG PAIN 30 tablet 0   metFORMIN (GLUCOPHAGE-XR) 500 MG 24 hr tablet Take 1 tablet (500 mg total) by mouth 2 (two) times daily with a meal. 60 tablet 11   omeprazole (PRILOSEC) 20 MG capsule Take 1 capsule (20 mg total) by mouth 2 (two) times daily. 30 capsule 11   pregabalin (LYRICA) 50 MG capsule Take 1 capsule (50 mg total) by mouth 3 (three) times daily. 90 capsule 11   rosuvastatin (CRESTOR) 20 MG tablet TAKE 1 TABLET BY MOUTH EVERY EVENING WITH SUPPER 30 tablet 11   temazepam (RESTORIL) 30 MG  capsule Take 1 capsule (30 mg total) by mouth at bedtime as needed for sleep. 30 capsule 2   traZODone (DESYREL) 100 MG tablet Take 1 tablet (100 mg total) by mouth at bedtime. 30 tablet 3   triamcinolone (KENALOG) 0.1 % APPLY TWICE DAILY WITH TERBINAFINE TO RASH UNTIL RESOLVED. 30 g 1   No current facility-administered medications for this visit.     Musculoskeletal: Strength & Muscle Tone: within normal limits Gait & Station: normal Patient leans: N/A  Psychiatric Specialty Exam: Review of Systems  Constitutional: Negative.   HENT: Negative.    Eyes: Negative.   Respiratory: Negative.    Cardiovascular: Negative.   Gastrointestinal: Negative.   Genitourinary: Negative.   Musculoskeletal: Negative.        TD  Skin: Negative.   Neurological: Negative.   Hematological: Negative.   Psychiatric/Behavioral:  Positive for sleep disturbance. Negative for agitation, behavioral problems, dysphoric mood, hallucinations and suicidal ideas. The patient is not nervous/anxious.     There were no vitals taken for this visit.There is no height or weight on file to calculate BMI.  General Appearance: Casual  Eye Contact:  Good  Speech:  Clear and Coherent and Normal Rate  Volume:  Normal  Mood:  Euthymic  Affect:  Appropriate and Congruent  Thought Process:  Goal Directed and Descriptions of Associations: Intact  Orientation:  Full (Time, Place, and Person)  Thought Content: WDL and Logical   Suicidal Thoughts:  No  Homicidal Thoughts:  No  Memory:  Immediate;   Good Recent;   Good Remote;   Good  Judgement:  Intact  Insight:  Present  Psychomotor Activity:  Normal  Concentration:  Concentration: Good and Attention Span: Good  Recall:  Good  Fund of Knowledge: Good  Language: Good  Akathisia:  No  Handed:  Right  AIMS (if indicated): done  Assets:  Communication Skills Desire for Improvement Financial Resources/Insurance Housing Leisure Time Social Support Transportation   ADL's:  Intact  Cognition: WNL  Sleep:  Fair   Screenings: AIMS  [place] Visit from 09/18/2021 in [facility] [place] Visit from 06/19/2021 in [facility] Clinical Support from 03/27/2021 in [facility] Clinical Support from 12/30/2020 in [facility] Clinical Support from 09/24/2020 in [place] Total Score '22 23 7 5 4      '$ GAD-7    [place] Visit [facility]/18/20[facility] [place] Visit from 06/19/2021 in [place] from 03/27/2021 in [facility] Clinical Support from 12/30/2020 in [facility] [place] Visit from 06/25/2020 in [facility]  Total GAD-7 Score '1 1 7 8 17      '$ PHQ2-9    [place] Visit from 09/18/2021 in [facility] [place] Visit from 06/19/2021 in [place] from 03/27/2021 in [place] from 12/30/2020 in [facility] [place] Visit from 06/25/2020 in [facility]  PHQ-2 Total Score 0 1 0 3 1  PHQ-9 Total Score -- -- -- 9 [place] Visit from 09/18/2021 in [facility] [place] Visit from 06/19/2021 in [place] from 03/27/2021 in [place] No Risk No Risk No Risk      Assessment and Plan: ZAMZAM [place] appears to be doing well.  She reports that medications are effective and managing her mental health without any adverse reaction.  During visit she is dressed appropriate for weather.  She is sitting upright in chair with  no noted distress.  She is alert/oriented x 4, calm/cooperative and mood is congruent with affect.  She spoke in a clear tone at moderate volume, and normal pace, with good eye contact.  Her thought process is coherent and relevant; and there is no indication that she is currently responding to internal/external stimuli, or experiencing delusional thought content.  She denies suicidal/self-harm/homicidal ideation, psychosis, paranoia, and fluctuations in mood.  She reports that TD has improved and less movement but not noticeable by this provider.  Encouraged to take medications as ordered.    Collaboration of Care: Collaboration of Care: Medication Management AEB Injection of long acting injectable and refill of medications and Psychiatrist AEB Follow up psychiatric evaluation  1. Schizophrenia, paranoid ([place]) - traZODone (DESYREL) 100 MG tablet; Take 1 tablet (100 mg total) by mouth at bedtime.  Dispense: 30 tablet; Refill: 3 - temazepam (RESTORIL) 30 MG capsule; Take 1 capsule (30 mg total) by mouth at bedtime as needed for sleep.  Dispense: 30 capsule; Refill: 2 - ARIPiprazole Lauroxil ER (ARISTADA) 441 MG/1.6ML prefilled syringe; INJECT '441MG'$  INTRAMUSCULARLY EVERY 4 WEEKS  Dispense: 1.6 mL; Refill: 3  2. Tardive dyskinesia - Deutetrabenazine (AUSTEDO) 12 MG TABS; Take 12 mg by mouth 2 (two) times daily.  Dispense: 60 tablet; Refill: 6  3. Insomnia due to other mental disorder - melatonin 5 MG TABS; Take 1 tablet (5 mg total) by mouth at bedtime.  Dispense: 30 tablet; Refill: 2   Patient/Guardian was advised Release of Information must be obtained prior to any record release in order to collaborate their care with an outside provider. Patient/Guardian was advised if they have not already done so to contact the registration department to sign all  necessary forms in order for [place] to release information regarding their care.   Consent: Patient/Guardian gives verbal consent for treatment and  assignment of benefits for services provided during this visit. Patient/Guardian expressed understanding and agreed to proceed.    Kaithlyn Teagle, NP 09/18/2021, 3:31 PM

## 2021-09-18 NOTE — Telephone Encounter (Signed)
In for her shot but she refused it saying today she is only here to see the provider. She did speak with Shuvon NP but would not get her shot. Per last shot she is 4 days early. Put her on the phone with Austedo people which is thru [employer] as her app was dropped because they were unable to make contact with her. SHe is very irritable today. Has very noticeable hand movements and mouth movements. States she has Austedo left but shouldn't, she was given samples till patient assistance was approved and it has not been approved which is why we called them today together. Person spoke with from [employer] said financial info was never completed and it needs to be done first and then she will have to allow them to make contact with her which she said she has spoken with them. Completed financial app today online, When she returns will call [employer] again to confirm her future shipment of the Austedo.

## 2021-09-18 NOTE — Progress Notes (Signed)
Patient arrived for Injection ARIPiprazole Lauroxil ER (ARISTADA) 441 MG [place] as Usual Tolerated injection well in left Arm

## 2021-09-19 ENCOUNTER — Telehealth (HOSPITAL_COMMUNITY): Payer: Self-pay | Admitting: *Deleted

## 2021-09-19 NOTE — Telephone Encounter (Signed)
I spoke with Pam O. With [employer] re the app for patients austedo and difficulty experiencing getting it approved with limited cooperation from patient and Austedo. She was able to push it thru and its been approved for her.

## 2021-09-22 ENCOUNTER — Other Ambulatory Visit: Payer: Self-pay

## 2021-09-25 ENCOUNTER — Ambulatory Visit (HOSPITAL_COMMUNITY): Payer: No Payment, Other

## 2021-10-13 ENCOUNTER — Ambulatory Visit: Payer: Self-pay | Admitting: Internal Medicine

## 2021-10-16 ENCOUNTER — Encounter (HOSPITAL_COMMUNITY): Payer: Self-pay

## 2021-10-16 ENCOUNTER — Ambulatory Visit (INDEPENDENT_AMBULATORY_CARE_PROVIDER_SITE_OTHER): Payer: No Payment, Other | Admitting: *Deleted

## 2021-10-16 VITALS — BP 144/81 | HR 94 | Ht 62.0 in | Wt 199.0 lb

## 2021-10-16 DIAGNOSIS — F200 Paranoid schizophrenia: Secondary | ICD-10-CM | POA: Diagnosis not present

## 2021-10-16 MED ORDER — ARIPIPRAZOLE LAUROXIL ER 441 MG/1.6ML IM PRSY
441.0000 mg | PREFILLED_SYRINGE | Freq: Once | INTRAMUSCULAR | Status: AC
  Administered 2021-10-16: 441 mg via INTRAMUSCULAR

## 2021-10-16 NOTE — Progress Notes (Signed)
Patient arrived for Injection -- ARIPiprazole Lauroxil ER (ARISTADA) 441 MG/1.6ML prefilled syringe INJECT '441MG'$  INTRAMUSCULARLY  EVERY 4 WEEKS  Tolerated well in  Right Arm -- Pleasant as Always

## 2021-11-04 ENCOUNTER — Ambulatory Visit: Payer: Self-pay | Admitting: Internal Medicine

## 2021-11-13 ENCOUNTER — Ambulatory Visit (HOSPITAL_COMMUNITY): Payer: No Payment, Other

## 2021-11-20 ENCOUNTER — Encounter (HOSPITAL_COMMUNITY): Payer: Self-pay

## 2021-11-20 ENCOUNTER — Ambulatory Visit (INDEPENDENT_AMBULATORY_CARE_PROVIDER_SITE_OTHER): Payer: No Payment, Other | Admitting: *Deleted

## 2021-11-20 VITALS — BP 121/75 | HR 94 | Ht 62.0 in | Wt 195.0 lb

## 2021-11-20 DIAGNOSIS — F200 Paranoid schizophrenia: Secondary | ICD-10-CM

## 2021-11-20 MED ORDER — ARIPIPRAZOLE LAUROXIL ER 441 MG/1.6ML IM PRSY
441.0000 mg | PREFILLED_SYRINGE | INTRAMUSCULAR | Status: AC
  Administered 2021-11-20 – 2022-01-15 (×3): 441 mg via INTRAMUSCULAR

## 2021-11-20 NOTE — Progress Notes (Signed)
In as scheduled for her monthly inj of Aristada 441 mg, she asked it be given in her R DELTOID today as she recently reports having a flu shot in her L arm. She continues to have a lot of tremor like movement in her hands. She states she is taking the Austedo. She handed me a piece of paper with [place] on it and the address and phone number. States she will be getting insurance soon, she will be 66 yo in Jan so this is possibly her Medicare pharmacy, will add it into her chart. She is to return in 28 days for her next inj.

## 2021-12-18 ENCOUNTER — Encounter (HOSPITAL_COMMUNITY): Payer: Self-pay | Admitting: Registered Nurse

## 2021-12-18 ENCOUNTER — Ambulatory Visit (HOSPITAL_COMMUNITY): Payer: Self-pay

## 2021-12-18 ENCOUNTER — Encounter (HOSPITAL_COMMUNITY): Payer: Self-pay

## 2021-12-18 ENCOUNTER — Ambulatory Visit (INDEPENDENT_AMBULATORY_CARE_PROVIDER_SITE_OTHER): Payer: No Payment, Other | Admitting: Registered Nurse

## 2021-12-18 VITALS — BP 129/71 | HR 96 | Ht 62.0 in | Wt 193.8 lb

## 2021-12-18 DIAGNOSIS — F200 Paranoid schizophrenia: Secondary | ICD-10-CM | POA: Diagnosis not present

## 2021-12-18 DIAGNOSIS — G2401 Drug induced subacute dyskinesia: Secondary | ICD-10-CM

## 2021-12-18 DIAGNOSIS — F5105 Insomnia due to other mental disorder: Secondary | ICD-10-CM | POA: Diagnosis not present

## 2021-12-18 DIAGNOSIS — F99 Mental disorder, not otherwise specified: Secondary | ICD-10-CM | POA: Diagnosis not present

## 2021-12-18 MED ORDER — MELATONIN 5 MG PO TABS: 5.0000 mg | ORAL_TABLET | Freq: Every day | ORAL | 3 refills | Status: DC

## 2021-12-18 MED ORDER — ARISTADA 441 MG/1.6ML IM PRSY: PREFILLED_SYRINGE | INTRAMUSCULAR | 3 refills | Status: DC

## 2021-12-18 MED ORDER — TEMAZEPAM 30 MG PO CAPS: 30.0000 mg | ORAL_CAPSULE | Freq: Every evening | ORAL | 3 refills | Status: DC | PRN

## 2021-12-18 NOTE — Progress Notes (Signed)
BH MD/PA/NP OP Progress Note  12/18/2021 3:38 PM Kristine Atkinson  MRN:  [id number]  Chief Complaint:  Chief Complaint  Patient presents with   Follow-up    Psychiatric evaluation and administration of long acting injectable   HPI: Kristine Atkinson 43 yr. female seen face to face in office today for follow up psychiatric evaluation and administration of long acting injectable.  Her psychiatric history includes paranoid schizophrenia and tardive dyskinesia (TD).  Her mental health is managed with Aristada 441 mg every 28 days, Trazodone 50 mg nightly as needed, and Restoril 30 mg nightly.  Kristine Atkinson states that she completed the Austedo (titration) but never received any more medication.  Reports that while taking, it did help with her TD.  Informed would have staff check to see what is happening with prior approval and why she hasn't been able to get the medication.  States that she is unable to tolerated the [place] and prefers the [place].  She was given another titration packet to complete while awaiting prior approval.  During this visit patient continues to have excessive movement in lips, tongue, hands, and feet.  Kristine Atkinson reports that Kristine Atkinson continues to manage her mental health with no adverse reactions other than the TD.  During this visit she denies depression and anxiety.  "Here you go with all them damn questions" Kristine Atkinson also denies mood swings, suicidal/self-harm/homicidal ideation, psychosis, and paranoia.  Reports she is eating and sleeping without difficulty.  AIMS, PHQ 2 & 9, C-SSRS, and GAD 7 screenings conducted by provider see scores below  Visit Diagnosis:    ICD-10-CM   1. Schizophrenia, paranoid ([place])  F20.0 temazepam (RESTORIL) 30 MG capsule    ARIPiprazole Lauroxil ER (ARISTADA) 441 MG/1.6ML prefilled syringe    2. Insomnia due to other mental disorder  F51.05 melatonin 5 MG TABS   F99       Past Psychiatric History: anxiety, depression, and paranoid  schizophrenia   Past Medical History:  Past Medical History:  Diagnosis Date   Cellulitis 06/06/2013   Chest pain 06/07/2015   Chronic pain    Cocaine abuse ([place]) 09/09/2012   Cocaine positive on UDS 09/09/2012.     Diabetes mellitus    Elevated serum creatinine 08/14/2013   Fibromyalgia    Greater trochanteric bursitis of left hip 05/29/2014   Hyperlipidemia    Hypertension    Pneumonia due to COVID-19 virus 02/11/2019   Hospitalized with hypoxia, but did not require intubation/ventilation.   Schizophrenia ([place])    Syncope 08/14/2013    Past Surgical History:  Procedure Laterality Date   OOPHORECTOMY     ectopic    Family Psychiatric History: None reported  Family History:  Family History  Problem Relation Age of Onset   Hypertension Mother    Cancer Maternal Aunt        colon cancer    Social History:  Social History   Socioeconomic History   Marital status: Married    Spouse name: Not on file   Number of children: Not on file   Years of education: Not on file   Highest education level: Not on file  Occupational History   Not on file  Tobacco Use   Smoking status: Former    Packs/day: 0.20    Years: 41.00    Total pack years: 8.20    Types: Cigarettes    Start date: 01/12/1973    Quit date: 06/13/2015    Years since quitting: 6.5  Smokeless tobacco: Never   Tobacco comments:    smokes about 3-4 cigs per day  as of  02/2014  Substance and Sexual Activity   Alcohol use: No    Alcohol/week: 0.0 standard drinks of alcohol   Drug use: Not Currently   Sexual activity: Not Currently  Other Topics Concern   Not on file  Social History Narrative   Lives with husband Kristine Atkinson).  Has 3 grown children.  Does not work.  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   Social Determinants of Health   Financial Resource Strain: Not on file  Food Insecurity: No Food Insecurity (10/03/2019)   Hunger Vital Sign    Worried About Running  Out of Food in the Last Year: Never true    Ran Out of Food in the Last Year: Never true  Transportation Needs: No Transportation Needs (10/03/2019)   PRAPARE - Hydrologist (Medical): No    Lack of Transportation (Non-Medical): No  Physical Activity: Not on file  Stress: Not on file  Social Connections: Not on file    Allergies:  Allergies  Allergen Reactions   Advil [Ibuprofen] Nausea Only   Penicillins Itching    Did it involve swelling of the face/tongue/throat, SOB, or low BP? Unknown Did it involve sudden or severe rash/hives, skin peeling, or any reaction on the inside of your mouth or nose? Unknown Did you need to seek medical attention at a hospital or doctor's office? Unknown When did it last happen? Unknown If all above answers are "NO", may proceed with cephalosporin use.    Metabolic Disorder Labs: Lab Results  Component Value Date   HGBA1C 5.8 (H) 05/21/2021   No results found for: "PROLACTIN" Lab Results  Component Value Date   CHOL 219 (H) 05/21/2021   TRIG 157 (H) 05/21/2021   HDL 55 05/21/2021   CHOLHDL 4.8 08/27/2014   VLDL 26 08/27/2014   LDLCALC 136 (H) 05/21/2021   LDLCALC 65 10/05/2019   Lab Results  Component Value Date   TSH 0.657 05/21/2021   TSH 0.667 03/02/2012    Therapeutic Level Labs: No results found for: "LITHIUM" No results found for: "VALPROATE" No results found for: "CBMZ"  Current Medications: Current Outpatient Medications  Medication Sig Dispense Refill   AgaMatrix Ultra-Thin Lancets MISC Check blood glucose twice daily before meals 100 each 11   ARIPiprazole Lauroxil ER (ARISTADA) 441 MG/1.6ML prefilled syringe INJECT '441MG'$  INTRAMUSCULARLY EVERY 4 WEEKS 1.6 mL 3   aspirin 81 MG tablet Take 1 tablet (81 mg total) by mouth daily. 100 tablet 1   carvedilol (COREG) 3.125 MG tablet Take 1 tablet (3.125 mg total) by mouth 2 (two) times daily. 60 tablet 11   Deutetrabenazine (AUSTEDO) 12 MG TABS Take  12 mg by mouth 2 (two) times daily. 60 tablet 6   glucose blood (AGAMATRIX PRESTO TEST) test strip Check blood glucose twice daily before meals 100 each 11   hydrochlorothiazide (HYDRODIURIL) 25 MG tablet Take 1 tablet (25 mg total) by mouth daily. 30 tablet 11   losartan (COZAAR) 50 MG tablet TAKE 1 TABLET BY MOUTH DAILY 30 tablet 11   melatonin 5 MG TABS Take 1 tablet (5 mg total) by mouth at bedtime. 30 tablet 3   meloxicam (MOBIC) 15 MG tablet TAKE 1 TABLET BY MOUTH DAILY WITH FOOD FOR LEG PAIN 30 tablet 0   metFORMIN (GLUCOPHAGE-XR) 500 MG 24 hr tablet Take 1 tablet (500  mg total) by mouth 2 (two) times daily with a meal. 60 tablet 11   omeprazole (PRILOSEC) 20 MG capsule Take 1 capsule (20 mg total) by mouth 2 (two) times daily. 30 capsule 11   pregabalin (LYRICA) 50 MG capsule Take 1 capsule (50 mg total) by mouth 3 (three) times daily. 90 capsule 11   rosuvastatin (CRESTOR) 20 MG tablet TAKE 1 TABLET BY MOUTH EVERY EVENING WITH SUPPER 30 tablet 11   temazepam (RESTORIL) 30 MG capsule Take 1 capsule (30 mg total) by mouth at bedtime as needed for sleep. 30 capsule 3   traZODone (DESYREL) 100 MG tablet Take 1 tablet (100 mg total) by mouth at bedtime. 30 tablet 3   triamcinolone (KENALOG) 0.1 % APPLY TWICE DAILY WITH TERBINAFINE TO RASH UNTIL RESOLVED. 30 g 1   Current Facility-Administered Medications  Medication Dose Route Frequency Provider Last Rate Last Admin   ARIPiprazole Lauroxil ER (ARISTADA) 441 MG/1.6ML prefilled syringe 441 mg  441 mg Intramuscular Q28 days Kristine Hewson B, NP   441 mg at 12/18/21 1530     Musculoskeletal: Strength & Muscle Tone: within normal limits Gait & Station: normal Patient leans: N/A  Psychiatric Specialty Exam: Review of Systems  Musculoskeletal:  Positive for myalgias.  Psychiatric/Behavioral: Negative.  Negative for agitation, confusion, dysphoric mood, hallucinations, self-injury and suicidal ideas. Sleep disturbance: Improved.  No  complaints.The patient is not nervous/anxious and is not hyperactive.   All other systems reviewed and are negative.   There were no vitals taken for this visit.There is no height or weight on file to calculate BMI.  General Appearance: Casual  Eye Contact:  Good  Speech:  Clear and Coherent and Normal Rate  Volume:  Normal  Mood:  Euthymic  Affect:  Appropriate and Congruent  Thought Process:  Coherent and Goal Directed  Orientation:  Full (Time, Place, and Person)  Thought Content: Logical   Suicidal Thoughts:  No  Homicidal Thoughts:  No  Memory:  Immediate;   Good Recent;   Good Remote;   Good  Judgement:  Intact  Insight:  Present  Psychomotor Activity:  Normal  Concentration:  Concentration: Good and Attention Span: Good  Recall:  Good  Fund of Knowledge: Good  Language: Good  Akathisia:  No  Handed:  Right  AIMS (if indicated): done  Assets:  Communication Skills Desire for Improvement Financial Resources/Insurance Housing Leisure Time Physical Health Resilience Social Support Transportation  ADL's:  Intact  Cognition: WNL  Sleep:  Good   Screenings: [place] Office Visit from 12/18/2021 in [facility] Office Visit from 09/18/2021 in [facility] Office Visit from 06/19/2021 in [place] from 03/27/2021 in [place] from 12/30/2020 in [place] Total Score '21 22 23 7 5      '$ [place] Office Visit from 12/18/2021 in [facility] Office Visit from 09/18/2021 in [facility] Office Visit from 06/19/2021 in [place] from 03/27/2021 in [place] from 12/30/2020 in [facility]  Total  GAD-7 Score 0 '1 1 7 8      '$ GYB6-3    [place] Office Visit from 12/18/2021 in [facility] Office Visit from 09/18/2021 in [facility] Office  Visit from 06/19/2021 in [place] from 03/27/2021 in [place] from 12/30/2020 in [facility]  PHQ-2 Total Score 0 0 1 0 3  PHQ-9 Total Score -- -- -- -- 9      [place] Office Visit from 12/18/2021 in [facility] Office Visit from 09/18/2021 in [facility] Office Visit from 06/19/2021 in [place] No Risk No Risk No Risk     Assessment and Plan: Kristine Atkinson appears to be doing well.  She is dressed appropriate for weather.  She is alert/oriented x 4, calm/cooperative, and her mood is pleasant.  She reports that Aristada continue to manage her mental health effectively with no adverse reaction.  She continues to have problems getting refills on Austedo.  Staff is aware and working on prior authorization.  Her thought process is coherent and relevant; and there is no indication that she is currently responding to internal/external stimuli, or experiencing delusional thought content.  She denies suicidal/self-harm/homicidal ideation, psychosis, paranoia, and fluctuations in mood.   1. Schizophrenia, paranoid ([place]) - temazepam (RESTORIL) 30 MG capsule; Take 1 capsule (30 mg total) by mouth at bedtime as needed for sleep.  Dispense: 30 capsule; Refill: 3 - ARIPiprazole Lauroxil ER (ARISTADA) 441 MG/1.6ML prefilled syringe; INJECT '441MG'$  INTRAMUSCULARLY EVERY 4 WEEKS  Dispense: 1.6 mL; Refill: 3  2. Insomnia due to other mental disorder - melatonin 5 MG TABS; Take 1 tablet (5 mg total) by mouth at bedtime.  Dispense: 30 tablet; Refill: 3    Collaboration of Care:  Collaboration of Care: Medication Management AEB Injection of long acting and refill on medication and Psychiatrist AEB Follow up psychiatric evaluation. Meds ordered this encounter  Medications   temazepam (RESTORIL) 30 MG capsule    Sig: Take 1 capsule (30 mg total) by mouth at bedtime as needed for sleep.    Dispense:  30 capsule    Refill:  3    Order Specific Question:   Supervising Provider    Answer:   Hampton Abbot [[id number]]   ARIPiprazole Lauroxil ER (ARISTADA) 441 MG/1.6ML prefilled syringe    Sig: INJECT '441MG'$  INTRAMUSCULARLY EVERY 4 WEEKS    Dispense:  1.6 mL    Refill:  3    Order Specific Question:   Supervising Provider    Answer:   KUMAR, ARCHANA [[id number]]   melatonin 5 MG TABS    Sig: Take 1 tablet (5 mg total) by mouth at bedtime.    Dispense:  30 tablet    Refill:  3    Order Specific Question:   Supervising Provider    Answer:   Hampton Abbot [[id number]]    Patient/Guardian was advised Release of Information must be obtained prior to any record release in order to collaborate their care with an outside provider. Patient/Guardian was advised if they have not already done so to contact the registration department to sign all necessary forms in order for [place] to release information regarding their care.   Consent: Patient/Guardian gives verbal consent for treatment and assignment of benefits for services provided during this visit. Patient/Guardian expressed understanding and agreed to proceed.    Layali Freund, NP 12/18/2021, 3:38 PM

## 2021-12-18 NOTE — Progress Notes (Signed)
Patient arrived for Injection ARIPiprazole Lauroxil ER (ARISTADA) 441 MG [place] as Usual Tolerated injection well in Right Arm

## 2022-01-07 ENCOUNTER — Ambulatory Visit: Payer: Self-pay | Admitting: Internal Medicine

## 2022-01-15 ENCOUNTER — Ambulatory Visit (HOSPITAL_COMMUNITY): Payer: Self-pay

## 2022-01-15 ENCOUNTER — Encounter (HOSPITAL_COMMUNITY): Payer: Self-pay

## 2022-01-15 VITALS — BP 169/97 | HR 93 | Ht 62.0 in | Wt 193.0 lb

## 2022-01-15 DIAGNOSIS — G2401 Drug induced subacute dyskinesia: Secondary | ICD-10-CM

## 2022-01-15 DIAGNOSIS — F200 Paranoid schizophrenia: Secondary | ICD-10-CM

## 2022-01-15 DIAGNOSIS — F5105 Insomnia due to other mental disorder: Secondary | ICD-10-CM

## 2022-01-15 NOTE — Progress Notes (Signed)
Patient arrived for Injection ARIPiprazole Lauroxil ER (ARISTADA) 441 MG [place] as Usual Tolerated injection well in LEFT Arm

## 2022-01-19 ENCOUNTER — Ambulatory Visit: Payer: Self-pay | Admitting: Internal Medicine

## 2022-02-10 ENCOUNTER — Other Ambulatory Visit: Payer: Self-pay

## 2022-02-10 MED ORDER — LOSARTAN POTASSIUM 50 MG PO TABS: 50.0000 mg | ORAL_TABLET | Freq: Every day | ORAL | 3 refills | Status: DC

## 2022-02-10 MED ORDER — METFORMIN HCL ER 500 MG PO TB24: 500.0000 mg | ORAL_TABLET | Freq: Two times a day (BID) | ORAL | 3 refills | Status: DC

## 2022-02-10 MED ORDER — PREGABALIN 50 MG PO CAPS: 50.0000 mg | ORAL_CAPSULE | Freq: Three times a day (TID) | ORAL | 3 refills | Status: DC

## 2022-02-10 MED ORDER — ROSUVASTATIN CALCIUM 20 MG PO TABS
ORAL_TABLET | ORAL | 3 refills | Status: DC
Start: 2022-02-10 — End: 2022-05-15

## 2022-02-10 MED ORDER — OMEPRAZOLE 20 MG PO CPDR: 20.0000 mg | DELAYED_RELEASE_CAPSULE | Freq: Two times a day (BID) | ORAL | 3 refills | Status: DC

## 2022-02-10 MED ORDER — HYDROCHLOROTHIAZIDE 25 MG PO TABS: 25.0000 mg | ORAL_TABLET | Freq: Every day | ORAL | 3 refills | Status: DC

## 2022-02-12 ENCOUNTER — Other Ambulatory Visit (HOSPITAL_COMMUNITY): Payer: Self-pay | Admitting: Psychiatry

## 2022-02-12 ENCOUNTER — Telehealth (HOSPITAL_COMMUNITY): Payer: Self-pay

## 2022-02-12 ENCOUNTER — Ambulatory Visit (HOSPITAL_COMMUNITY): Payer: Medicare HMO

## 2022-02-12 DIAGNOSIS — F200 Paranoid schizophrenia: Secondary | ICD-10-CM

## 2022-02-12 DIAGNOSIS — F5105 Insomnia due to other mental disorder: Secondary | ICD-10-CM

## 2022-02-12 DIAGNOSIS — G2401 Drug induced subacute dyskinesia: Secondary | ICD-10-CM

## 2022-02-12 MED ORDER — MELATONIN 5 MG PO TABS: 5.0000 mg | ORAL_TABLET | Freq: Every day | ORAL | 3 refills | Status: DC

## 2022-02-12 MED ORDER — TRAZODONE HCL 100 MG PO TABS: 100.0000 mg | ORAL_TABLET | Freq: Every day | ORAL | 3 refills | Status: DC

## 2022-02-12 MED ORDER — AUSTEDO 12 MG PO TABS: 12.0000 mg | ORAL_TABLET | Freq: Two times a day (BID) | ORAL | 6 refills | Status: DC

## 2022-02-12 MED ORDER — TEMAZEPAM 30 MG PO CAPS: 30.0000 mg | ORAL_CAPSULE | Freq: Every evening | ORAL | 3 refills | Status: DC | PRN

## 2022-02-12 MED ORDER — ARISTADA 441 MG/1.6ML IM PRSY: PREFILLED_SYRINGE | INTRAMUSCULAR | 3 refills | Status: DC

## 2022-02-12 NOTE — Progress Notes (Signed)
Pt refused to wait for injection

## 2022-02-12 NOTE — Telephone Encounter (Signed)
Provider attempted to speak with patient she however noted that she was not feeling well and reported that she wanted to leave. Today she denies SI/HI/. Patient unwilling to do full assessment but requested that medications be sent to [facility]. Provider agreeable to this. Patient was rescheduled to see writer on 03/12/2022. She reports that at this time she does not wish to take her injection. She did not bring her injection and samples were not available. Provider discussed with patient husband patients wishes. He notes that he would bring her back on 03/12/2022 for follow up. At that time provider and patient will discuss if she wants to continue receiving her LAI. No other concerns noted at this time.

## 2022-02-17 ENCOUNTER — Ambulatory Visit (INDEPENDENT_AMBULATORY_CARE_PROVIDER_SITE_OTHER): Payer: Medicare HMO | Admitting: Internal Medicine

## 2022-02-17 VITALS — BP 142/70 | HR 76 | Resp 16 | Ht 61.5 in | Wt 187.0 lb

## 2022-02-17 DIAGNOSIS — E785 Hyperlipidemia, unspecified: Secondary | ICD-10-CM

## 2022-02-17 DIAGNOSIS — M79652 Pain in left thigh: Secondary | ICD-10-CM

## 2022-02-17 DIAGNOSIS — I10 Essential (primary) hypertension: Secondary | ICD-10-CM | POA: Diagnosis not present

## 2022-02-17 DIAGNOSIS — Z23 Encounter for immunization: Secondary | ICD-10-CM

## 2022-02-17 DIAGNOSIS — Z1231 Encounter for screening mammogram for malignant neoplasm of breast: Secondary | ICD-10-CM

## 2022-02-17 DIAGNOSIS — E1142 Type 2 diabetes mellitus with diabetic polyneuropathy: Secondary | ICD-10-CM

## 2022-02-17 DIAGNOSIS — M79651 Pain in right thigh: Secondary | ICD-10-CM

## 2022-02-17 MED ORDER — MELOXICAM 15 MG PO TABS: ORAL_TABLET | ORAL | 1 refills | Status: DC

## 2022-02-17 MED ORDER — PREGABALIN 50 MG PO CAPS: 50.0000 mg | ORAL_CAPSULE | Freq: Three times a day (TID) | ORAL | 3 refills | Status: DC

## 2022-02-17 MED ORDER — CARVEDILOL 3.125 MG PO TABS: 3.1250 mg | ORAL_TABLET | Freq: Two times a day (BID) | ORAL | 3 refills | Status: DC

## 2022-02-17 NOTE — Progress Notes (Signed)
Subjective:    Patient ID: Kristine Atkinson, female   DOB: 1955/12/14, 67 y.o.   MRN: [id number]   HPI  Here after another hiatus   Hypertension:  Has Medicare now and meds switched over to [facility] mail order pharmacy and was a delay in getting meds sent to her.  She is also out of her Carvedilol.  That does not appear to have been sent to [facility].  2.  Arm and hand pain with numbness as well as peripheral neuropathy of feet/legs and bilateral lateral thigh pain.  Has been off Lyrica for 4-5 months.  States pharmacy told her she had no further refills.    3.  DM:  does not check sugars.  A1C was 5.8% in May of 2023.  She has 7 cancellations since seen then.  Was out of her Metformin for about 3 weeks.    4.  Hyperlipidemia:  off Rosuvastatin for 3 weeks.    5.  HM:  has not had covid booster, needs Shingrix, pneumonia, Td as well. States no one called for mammogram    Current Meds  Medication Sig   aspirin 81 MG tablet Take 1 tablet (81 mg total) by mouth daily.   Deutetrabenazine (AUSTEDO) 12 MG TABS Take 1 tablet (12 mg total) by mouth 2 (two) times daily.   hydrochlorothiazide (HYDRODIURIL) 25 MG tablet Take 1 tablet (25 mg total) by mouth daily.   losartan (COZAAR) 50 MG tablet Take 1 tablet (50 mg total) by mouth daily.   metFORMIN (GLUCOPHAGE-XR) 500 MG 24 hr tablet Take 1 tablet (500 mg total) by mouth 2 (two) times daily with a meal.   omeprazole (PRILOSEC) 20 MG capsule Take 1 capsule (20 mg total) by mouth 2 (two) times daily.   rosuvastatin (CRESTOR) 20 MG tablet TAKE 1 TABLET BY MOUTH EVERY EVENING WITH SUPPER   temazepam (RESTORIL) 30 MG capsule Take 1 capsule (30 mg total) by mouth at bedtime as needed for sleep.   traZODone (DESYREL) 100 MG tablet Take 1 tablet (100 mg total) by mouth at bedtime.   Allergies  Allergen Reactions   Advil [Ibuprofen] Nausea Only   Penicillins Itching    Did it involve swelling of the face/tongue/throat, SOB, or low BP?  Unknown Did it involve sudden or severe rash/hives, skin peeling, or any reaction on the inside of your mouth or nose? Unknown Did you need to seek medical attention at a hospital or doctor's office? Unknown When did it last happen? Unknown If all above answers are "NO", may proceed with cephalosporin use.     Review of Systems    Objective:   BP (!) 142/70 (BP Location: Right Arm, Patient Position: Sitting, Cuff Size: Normal)   Pulse 76   Resp 16   Ht 5' 1.5" (1.562 m)   Wt 187 lb (84.8 kg)   BMI 34.76 kg/m   Physical Exam NAD HEENT:  PERRL, EOMI, TMs pearly gray, throat without injection Neck:  Supple, No adenopathy Chest:  CTA CV:  RRR with normal S1 and S2, No S3, S4 or murmur.  No carotid bruits.  Carotid, radial and DP pulses normal and equal Abd:  S, NT, No HSM or mass, + BS LE:  No edema Neuro:  oral and extremity involuntary movements   Assessment & Plan    Hypertension:  Not controlled as off meds with switch in pharmacies.  Recheck BP in one month.  Carvedilol, HCTZ, Losartan  2.  Hyperlipidemia:  fasting  labs in 3 months after back on meds.  Rosuvastatin.    3.  DM:  labs in 3 months.  Metformin.  4.  Peripheral neuropathy:  Restart Lyrica  5.  Chronic pain:  Lyrica and Meloxicam, the latter only as needed.  6.  HM:  Mammogram ordered and Pneumo conjugate 20.  Will get Shingrix with follow up.  Encouraged COVID booster as well.

## 2022-03-12 ENCOUNTER — Ambulatory Visit (HOSPITAL_COMMUNITY): Payer: Medicare HMO

## 2022-03-12 ENCOUNTER — Encounter (HOSPITAL_COMMUNITY): Payer: Self-pay

## 2022-03-12 ENCOUNTER — Ambulatory Visit (INDEPENDENT_AMBULATORY_CARE_PROVIDER_SITE_OTHER): Payer: Medicare HMO | Admitting: Psychiatry

## 2022-03-12 DIAGNOSIS — G2401 Drug induced subacute dyskinesia: Secondary | ICD-10-CM | POA: Diagnosis not present

## 2022-03-12 DIAGNOSIS — F200 Paranoid schizophrenia: Secondary | ICD-10-CM | POA: Diagnosis not present

## 2022-03-12 MED ORDER — AUSTEDO 12 MG PO TABS: 12.0000 mg | ORAL_TABLET | Freq: Two times a day (BID) | ORAL | 6 refills | Status: DC

## 2022-03-12 MED ORDER — TRAZODONE HCL 100 MG PO TABS: 100.0000 mg | ORAL_TABLET | Freq: Every day | ORAL | 3 refills | Status: DC

## 2022-03-12 MED ORDER — TEMAZEPAM 30 MG PO CAPS: 30.0000 mg | ORAL_CAPSULE | Freq: Every evening | ORAL | 3 refills | Status: DC | PRN

## 2022-03-12 NOTE — Progress Notes (Signed)
BH MD/PA/NP OP Progress Note  03/12/2022 3:45 PM Kristine Atkinson  MRN:  [id number]  Chief Complaint: "I feel better now that I am off the shot"  HPI: 67 year old female seen today for follow up psychiatric evaluation. She has a psychiatric history of anxiety, depression, paranoid schizophrenia, and unspecified adult personality disorder. She is currently managed on Aristada 441 mg monthly, Austedo 12 mg twice daily, Trazodone 100 mg nightly as needed, and Restoril 30 mg daily as needed. She notes that she dislikes Kristine Atkinson and reports that her other medications are effective in managing her psychiatric conditions.   Today she is well groomed, pleasant, cooperative, and engaged in conversation. Patient informed Probation officer that she feels physically better without her Aristada injection. She notes that she is more alert and less irritable. Patient presented with symptoms of TD and scored a 5 on her Aims assessment. Patient notes that she has been without her Austedo but would like to restart it.   Today patient reports that she has mild anxiety and depression but reports that she is able to cope with it. Today provider conducted a GAD 7 and patient scored a 13. Provider also conducted a PHQ 9 and patient scored a 13. She endorses adequate sleep and appetite. Today she denies SI/HI/VAH, mania, or paranoia.   Patient notes that she is dislikes Aristada and at this time does not wish to restart it. She will continue all other medications as prescribed. No medication changes made today. Patient agreeable to continue medications as prescribed. Patient presented with symptoms of TD. She notes that she has been without her Austedo but will restart Austedo 12 mg twice daily.  Visit Diagnosis:    ICD-10-CM   1. Tardive dyskinesia  G24.01 Deutetrabenazine (AUSTEDO) 12 MG TABS    2. Schizophrenia, paranoid ([place])  F20.0 traZODone (DESYREL) 100 MG tablet    temazepam (RESTORIL) 30 MG capsule      Past  Psychiatric History: anxiety, depression, paranoid schizophrenia, and unspecified adult personality disorder  Past Medical History:  Past Medical History:  Diagnosis Date   Cellulitis 06/06/2013   Chest pain 06/07/2015   Chronic pain    Cocaine abuse ([place]) 09/09/2012   Cocaine positive on UDS 09/09/2012.     Diabetes mellitus    Elevated serum creatinine 08/14/2013   Fibromyalgia    Greater trochanteric bursitis of left hip 05/29/2014   Hyperlipidemia    Hypertension    Pneumonia due to COVID-19 virus 02/11/2019   Hospitalized with hypoxia, but did not require intubation/ventilation.   Schizophrenia ([place])    Syncope 08/14/2013    Past Surgical History:  Procedure Laterality Date   OOPHORECTOMY     ectopic    Family Psychiatric History: None reported   Family History:  Family History  Problem Relation Age of Onset   Hypertension Mother    Cancer Maternal Aunt        colon cancer    Social History:  Social History   Socioeconomic History   Marital status: Married    Spouse name: Not on file   Number of children: Not on file   Years of education: Not on file   Highest education level: Not on file  Occupational History   Not on file  Tobacco Use   Smoking status: Former    Packs/day: 0.20    Years: 41.00    Total pack years: 8.20    Types: Cigarettes    Start date: 01/12/1973    Quit date:  06/13/2015    Years since quitting: 6.7   Smokeless tobacco: Never   Tobacco comments:    smokes about 3-4 cigs per day  as of  02/2014  Substance and Sexual Activity   Alcohol use: No    Alcohol/week: 0.0 standard drinks of alcohol   Drug use: Not Currently   Sexual activity: Not Currently  Other Topics Concern   Not on file  Social History Narrative   Lives with husband Kristine Atkinson).  Has 3 grown children.  Does not work.  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   Social Determinants of Health   Financial Resource Strain: Not on file   Food Insecurity: No Food Insecurity (10/03/2019)   Hunger Vital Sign    Worried About Running Out of Food in the Last Year: Never true    Ran Out of Food in the Last Year: Never true  Transportation Needs: No Transportation Needs (10/03/2019)   PRAPARE - Hydrologist (Medical): No    Lack of Transportation (Non-Medical): No  Physical Activity: Not on file  Stress: Not on file  Social Connections: Not on file    Allergies:  Allergies  Allergen Reactions   Advil [Ibuprofen] Nausea Only   Penicillins Itching    Did it involve swelling of the face/tongue/throat, SOB, or low BP? Unknown Did it involve sudden or severe rash/hives, skin peeling, or any reaction on the inside of your mouth or nose? Unknown Did you need to seek medical attention at a hospital or doctor's office? Unknown When did it last happen? Unknown If all above answers are "NO", may proceed with cephalosporin use.    Metabolic Disorder Labs: Lab Results  Component Value Date   HGBA1C 5.8 (H) 05/21/2021   No results found for: "PROLACTIN" Lab Results  Component Value Date   CHOL 219 (H) 05/21/2021   TRIG 157 (H) 05/21/2021   HDL 55 05/21/2021   CHOLHDL 4.8 08/27/2014   VLDL 26 08/27/2014   LDLCALC 136 (H) 05/21/2021   LDLCALC 65 10/05/2019   Lab Results  Component Value Date   TSH 0.657 05/21/2021   TSH 0.667 03/02/2012    Therapeutic Level Labs: No results found for: "LITHIUM" No results found for: "VALPROATE" No results found for: "CBMZ"  Current Medications: Current Outpatient Medications  Medication Sig Dispense Refill   AgaMatrix Ultra-Thin Lancets MISC Check blood glucose twice daily before meals (Patient not taking: Reported on 02/17/2022) 100 each 11   aspirin 81 MG tablet Take 1 tablet (81 mg total) by mouth daily. 100 tablet 1   carvedilol (COREG) 3.125 MG tablet Take 1 tablet (3.125 mg total) by mouth 2 (two) times daily. 180 tablet 3   Deutetrabenazine  (AUSTEDO) 12 MG TABS Take 1 tablet (12 mg total) by mouth 2 (two) times daily. 60 tablet 6   glucose blood (AGAMATRIX PRESTO TEST) test strip Check blood glucose twice daily before meals (Patient not taking: Reported on 02/17/2022) 100 each 11   hydrochlorothiazide (HYDRODIURIL) 25 MG tablet Take 1 tablet (25 mg total) by mouth daily. 30 tablet 3   losartan (COZAAR) 50 MG tablet Take 1 tablet (50 mg total) by mouth daily. 30 tablet 3   melatonin 5 MG TABS Take 1 tablet (5 mg total) by mouth at bedtime. 30 tablet 3   meloxicam (MOBIC) 15 MG tablet 1 TABLET BY MOUTH DAILY WITH FOOD AS NEEDED FOR LEG PAIN 60 tablet 1   metFORMIN (  GLUCOPHAGE-XR) 500 MG 24 hr tablet Take 1 tablet (500 mg total) by mouth 2 (two) times daily with a meal. 60 tablet 3   omeprazole (PRILOSEC) 20 MG capsule Take 1 capsule (20 mg total) by mouth 2 (two) times daily. 30 capsule 3   pregabalin (LYRICA) 50 MG capsule Take 1 capsule (50 mg total) by mouth 3 (three) times daily. 270 capsule 3   rosuvastatin (CRESTOR) 20 MG tablet TAKE 1 TABLET BY MOUTH EVERY EVENING WITH SUPPER 30 tablet 3   temazepam (RESTORIL) 30 MG capsule Take 1 capsule (30 mg total) by mouth at bedtime as needed for sleep. 30 capsule 3   traZODone (DESYREL) 100 MG tablet Take 1 tablet (100 mg total) by mouth at bedtime. 30 tablet 3   No current facility-administered medications for this visit.     Musculoskeletal: Strength & Muscle Tone: within normal limits Gait & Station: normal Patient leans: N/A  Psychiatric Specialty Exam: Review of Systems  There were no vitals taken for this visit.There is no height or weight on file to calculate BMI.  General Appearance: Well Groomed  Eye Contact:  Good  Speech:  Slurred  Volume:  Normal  Mood:  Anxious and Depressed  Affect:  Appropriate and Congruent  Thought Process:  Coherent, Goal Directed, and Linear  Orientation:  Full (Time, Place, and Person)  Thought Content: WDL and Logical   Suicidal Thoughts:   No  Homicidal Thoughts:  No  Memory:  Immediate;   Good Recent;   Good Remote;   Good  Judgement:  Good  Insight:  Good  Psychomotor Activity:  Normal  Concentration:  Concentration: Good and Attention Span: Good  Recall:  Good  Fund of Knowledge: Good  Language: Good  Akathisia:  No  Handed:  Right  AIMS (if indicated): not done  Assets:  Communication Skills Desire for Improvement Financial Resources/Insurance Housing Intimacy Leisure Time Physical Health Social Support  ADL's:  Intact  Cognition: WNL  Sleep:  Good   Screenings: [place] Office Visit from 12/18/2021 in [facility] Office Visit from 09/18/2021 in [facility] Office Visit from 06/19/2021 in [place] from 03/27/2021 in [place] from 12/30/2020 in [place] Total Score '21 22 23 7 5      '$ [place] Office Visit from 03/12/2022 in [facility] Office Visit from 12/18/2021 in [facility] Office Visit from 09/18/2021 in [facility] Office Visit from 06/19/2021 in [place] from 03/27/2021 in [facility]  Total GAD-7 Score 13 0 '1 1 7      '$ PHQ2-9    [place] Office Visit from 03/12/2022 in [facility] Office Visit from 12/18/2021 in [facility] Office Visit from 09/18/2021 in [facility] Office Visit from 06/19/2021 in [place] from 03/27/2021 in [facility]  PHQ-2 Total Score 4 0 0 1 0  PHQ-9 Total Score 13 -- -- -- --      [place] Office Visit from 12/18/2021 in [facility] Office Visit from 09/18/2021 in [name] Office Visit from 06/19/2021 in [place]  CATEGORY No Risk No Risk No Risk        Assessment and Plan: Patient notes that she is dislikes Kristine Atkinson and at this time does not wish to restart it. She will continue all other medications as prescribed. No medication changes made today. Patient agreeable to continue medications as prescribed. Patient presented with symptoms of TD. She notes that she has been without her Austedo but will restart Austedo 12 mg twice daily. Patient scored a 5 on her Aims assessment today.   1. Tardive dyskinesia  Continue- Deutetrabenazine (AUSTEDO) 12 MG TABS; Take 1 tablet (12 mg total) by mouth 2 (two) times daily.  Dispense: 60 tablet; Refill: 6  2. Schizophrenia, paranoid ([place])  Continue- traZODone (DESYREL) 100 MG tablet; Take 1 tablet (100 mg total) by mouth at bedtime.  Dispense: 30 tablet; Refill: 3 Continue- temazepam (RESTORIL) 30 MG capsule; Take 1 capsule (30 mg total) by mouth at bedtime as needed for sleep.  Dispense: 30 capsule; Refill: 3   Collaboration of Care: Collaboration of Care: Other provider involved in patient's care AEB PCP  Patient/Guardian was advised Release of Information must be obtained prior to any record release in order to collaborate their care with an outside provider. Patient/Guardian was advised if they have not already done so to contact the registration department to sign all necessary forms in order for [place] to release information regarding their care.   Consent: Patient/Guardian gives verbal consent for treatment and assignment of benefits for services provided during this visit. Patient/Guardian expressed understanding and agreed to proceed.   Follow up in 3 months   Salley Slaughter, NP 03/12/2022, 3:45 PM

## 2022-03-19 ENCOUNTER — Ambulatory Visit: Payer: Medicare HMO | Admitting: Internal Medicine

## 2022-03-23 ENCOUNTER — Ambulatory Visit: Payer: Medicare HMO | Admitting: Internal Medicine

## 2022-03-24 ENCOUNTER — Ambulatory Visit (INDEPENDENT_AMBULATORY_CARE_PROVIDER_SITE_OTHER): Payer: Medicare HMO | Admitting: Internal Medicine

## 2022-03-24 VITALS — BP 138/72 | HR 84

## 2022-03-24 DIAGNOSIS — Z23 Encounter for immunization: Secondary | ICD-10-CM | POA: Diagnosis not present

## 2022-03-26 NOTE — Progress Notes (Signed)
After reporting bp to Dr Mulberry, no changes to medication will be made  

## 2022-03-31 ENCOUNTER — Other Ambulatory Visit: Payer: Self-pay | Admitting: Internal Medicine

## 2022-04-16 ENCOUNTER — Ambulatory Visit
Admission: RE | Admit: 2022-04-16 | Discharge: 2022-04-16 | Disposition: A | Discharge status: Home / Self Care | Payer: Medicare HMO | Source: Ambulatory Visit | Attending: Internal Medicine | Admitting: Internal Medicine

## 2022-04-16 DIAGNOSIS — Z1231 Encounter for screening mammogram for malignant neoplasm of breast: Secondary | ICD-10-CM

## 2022-05-08 ENCOUNTER — Other Ambulatory Visit: Payer: Self-pay

## 2022-05-08 ENCOUNTER — Other Ambulatory Visit: Payer: Self-pay | Admitting: Internal Medicine

## 2022-05-11 ENCOUNTER — Other Ambulatory Visit: Payer: Self-pay | Admitting: Internal Medicine

## 2022-05-18 ENCOUNTER — Other Ambulatory Visit: Payer: Medicare HMO

## 2022-05-21 ENCOUNTER — Ambulatory Visit: Payer: Medicare HMO | Admitting: Internal Medicine

## 2022-06-01 ENCOUNTER — Other Ambulatory Visit: Payer: Self-pay | Admitting: Internal Medicine

## 2022-06-01 ENCOUNTER — Other Ambulatory Visit (INDEPENDENT_AMBULATORY_CARE_PROVIDER_SITE_OTHER): Payer: Medicare HMO

## 2022-06-01 DIAGNOSIS — Z79899 Other long term (current) drug therapy: Secondary | ICD-10-CM

## 2022-06-01 DIAGNOSIS — E1142 Type 2 diabetes mellitus with diabetic polyneuropathy: Secondary | ICD-10-CM | POA: Diagnosis not present

## 2022-06-01 DIAGNOSIS — E785 Hyperlipidemia, unspecified: Secondary | ICD-10-CM

## 2022-06-02 LAB — CBC WITH DIFFERENTIAL/PLATELET
Basophils Absolute: 0.1 10*3/uL (ref 0.0–0.2)
Basos: 1 %
EOS (ABSOLUTE): 0.1 10*3/uL (ref 0.0–0.4)
Eos: 2 %
Hematocrit: 39 % (ref 34.0–46.6)
Hemoglobin: 12.5 g/dL (ref 11.1–15.9)
Immature Grans (Abs): 0 10*3/uL (ref 0.0–0.1)
Immature Granulocytes: 0 %
Lymphocytes Absolute: 3.3 10*3/uL — ABNORMAL HIGH (ref 0.7–3.1)
Lymphs: 41 %
MCH: 28.9 pg (ref 26.6–33.0)
MCHC: 32.1 g/dL (ref 31.5–35.7)
MCV: 90 fL (ref 79–97)
Monocytes Absolute: 0.5 10*3/uL (ref 0.1–0.9)
Monocytes: 6 %
Neutrophils Absolute: 4.1 10*3/uL (ref 1.4–7.0)
Neutrophils: 50 %
Platelets: 308 10*3/uL (ref 150–450)
RBC: 4.33 x10E6/uL (ref 3.77–5.28)
RDW: 15.8 % — ABNORMAL HIGH (ref 11.7–15.4)
WBC: 8.1 10*3/uL (ref 3.4–10.8)

## 2022-06-02 LAB — COMPREHENSIVE METABOLIC PANEL
ALT: 13 IU/L (ref 0–32)
AST: 17 IU/L (ref 0–40)
Albumin/Globulin Ratio: 1.7 (ref 1.2–2.2)
Albumin: 4.1 g/dL (ref 3.9–4.9)
Alkaline Phosphatase: 89 IU/L (ref 44–121)
BUN/Creatinine Ratio: 22 (ref 12–28)
BUN: 23 mg/dL (ref 8–27)
Bilirubin Total: 0.2 mg/dL (ref 0.0–1.2)
CO2: 28 mmol/L (ref 20–29)
Calcium: 9 mg/dL (ref 8.7–10.3)
Chloride: 102 mmol/L (ref 96–106)
Creatinine, Ser: 1.06 mg/dL — ABNORMAL HIGH (ref 0.57–1.00)
Globulin, Total: 2.4 g/dL (ref 1.5–4.5)
Glucose: 100 mg/dL — ABNORMAL HIGH (ref 70–99)
Potassium: 4.8 mmol/L (ref 3.5–5.2)
Sodium: 143 mmol/L (ref 134–144)
Total Protein: 6.5 g/dL (ref 6.0–8.5)
eGFR: 58 mL/min/{1.73_m2} — ABNORMAL LOW (ref >59)

## 2022-06-02 LAB — LIPID PANEL W/O CHOL/HDL RATIO
Cholesterol, Total: 135 mg/dL (ref 100–199)
HDL: 56 mg/dL (ref >39)
LDL Chol Calc (NIH): 64 mg/dL (ref 0–99)
Triglycerides: 77 mg/dL (ref 0–149)
VLDL Cholesterol Cal: 15 mg/dL (ref 5–40)

## 2022-06-02 LAB — HEMOGLOBIN A1C
Est. average glucose Bld gHb Est-mCnc: 137 mg/dL
Hgb A1c MFr Bld: 6.4 % — ABNORMAL HIGH (ref 4.8–5.6)

## 2022-06-09 ENCOUNTER — Encounter (HOSPITAL_COMMUNITY): Payer: Self-pay | Admitting: Psychiatry

## 2022-06-09 ENCOUNTER — Ambulatory Visit (INDEPENDENT_AMBULATORY_CARE_PROVIDER_SITE_OTHER): Payer: Medicare HMO | Admitting: Psychiatry

## 2022-06-09 ENCOUNTER — Ambulatory Visit (HOSPITAL_COMMUNITY): Payer: Medicare HMO | Admitting: Psychiatry

## 2022-06-09 DIAGNOSIS — F99 Mental disorder, not otherwise specified: Secondary | ICD-10-CM | POA: Diagnosis not present

## 2022-06-09 DIAGNOSIS — F200 Paranoid schizophrenia: Secondary | ICD-10-CM | POA: Diagnosis not present

## 2022-06-09 DIAGNOSIS — F5105 Insomnia due to other mental disorder: Secondary | ICD-10-CM

## 2022-06-09 DIAGNOSIS — G2401 Drug induced subacute dyskinesia: Secondary | ICD-10-CM

## 2022-06-09 MED ORDER — AUSTEDO XR PATIENT TITRATION 6 & 12 & 24 MG PO TEPK: 6.0000 mg | EXTENDED_RELEASE_TABLET | Freq: Every day | ORAL | 3 refills | Status: DC

## 2022-06-09 MED ORDER — TRAZODONE HCL 100 MG PO TABS: 100.0000 mg | ORAL_TABLET | Freq: Every day | ORAL | 3 refills | Status: DC

## 2022-06-09 MED ORDER — AUSTEDO 12 MG PO TABS
12.0000 mg | ORAL_TABLET | Freq: Two times a day (BID) | ORAL | 6 refills | Status: DC
Start: 2022-06-09 — End: 2022-09-08

## 2022-06-09 MED ORDER — MELATONIN 5 MG PO TABS: 5.0000 mg | ORAL_TABLET | Freq: Every day | ORAL | 3 refills | Status: DC

## 2022-06-09 MED ORDER — TEMAZEPAM 30 MG PO CAPS
30.0000 mg | ORAL_CAPSULE | Freq: Every evening | ORAL | 3 refills | Status: DC | PRN
Start: 2022-06-09 — End: 2022-08-27

## 2022-06-09 NOTE — Progress Notes (Signed)
BH MD/PA/NP OP Progress Note  06/09/2022 2:31 PM Kristine Atkinson  MRN:  [id number]  Chief Complaint: "I have not received Austedo"  HPI: 67 year old female seen today for follow up psychiatric evaluation. She has a psychiatric history of anxiety, depression, paranoid schizophrenia, and unspecified adult personality disorder. She is currently managed on Austedo 12 mg twice daily, Trazodone 100 mg nightly as needed, melatonin 5 mg, and Restoril 30 mg daily as needed.  She reports her medications are somewhat effective in managing her psychiatric conditions.    Today she is well groomed, pleasant, cooperative, and engaged in conversation. Patient informed Clinical research associate that she has not received  Austedo and continues to suffer from symptoms of TD.  Today provider conducted an aims assessment and patient scored a 6, at her last visit she scored a 5.  Patient reports that she would like to restart Austedo as  her TD is bothersome.  Patient was on Ingrezza in the past but it caused abdominal discomfort.  Since her last visit she reports that her anxiety and depression are well-managed.  Provider conducted a GAD-7 and patient scored a 6, at her last visit she scored a 13.  Provider also conducted PHQ-9 P scored a 0, at her last visit she scored a 13.  She endorses adequate sleep and increased appetite.  Today she denies SI/HI/AVH, mania, or paranoia.  Today patient given a sample starter kit of Austedo. Potential side effects of medication and risks vs benefits of treatment vs non-treatment were explained and discussed. All questions were answered.  She will continue all other medications as prescribed.  No other concerns noted at this time.  Visit Diagnosis:    ICD-10-CM   1. Tardive dyskinesia  G24.01 Deutetrabenazine (AUSTEDO) 12 MG TABS    Deutetrabenazine (AUSTEDO XR PATIENT TITRATION) 6 & 12 & 24 MG TEPK    2. Insomnia due to other mental disorder  F51.05 melatonin 5 MG TABS   F99     3.  Schizophrenia, paranoid ([facility])  F20.0 traZODone (DESYREL) 100 MG tablet    temazepam (RESTORIL) 30 MG capsule      Past Psychiatric History: anxiety, depression, paranoid schizophrenia, and unspecified adult personality disorder  Past Medical History:  Past Medical History:  Diagnosis Date   Cellulitis 06/06/2013   Chest pain 06/07/2015   Chronic pain    Cocaine abuse ([facility]) 09/09/2012   Cocaine positive on UDS 09/09/2012.     Diabetes mellitus    Elevated serum creatinine 08/14/2013   Fibromyalgia    Greater trochanteric bursitis of left hip 05/29/2014   Hyperlipidemia    Hypertension    Pneumonia due to COVID-19 virus 02/11/2019   Hospitalized with hypoxia, but did not require intubation/ventilation.   Schizophrenia ([facility])    Syncope 08/14/2013    Past Surgical History:  Procedure Laterality Date   OOPHORECTOMY     ectopic    Family Psychiatric History: None reported   Family History:  Family History  Problem Relation Age of Onset   Hypertension Mother    Cancer Maternal Aunt        colon cancer    Social History:  Social History   Socioeconomic History   Marital status: Married    Spouse name: Not on file   Number of children: Not on file   Years of education: Not on file   Highest education level: Not on file  Occupational History   Not on file  Tobacco Use   Smoking status:  Former    Packs/day: 0.20    Years: 41.00    Additional pack years: 0.00    Total pack years: 8.20    Types: Cigarettes    Start date: 01/12/1973    Quit date: 06/13/2015    Years since quitting: 6.9   Smokeless tobacco: Never   Tobacco comments:    smokes about 3-4 cigs per day  as of  02/2014  Substance and Sexual Activity   Alcohol use: No    Alcohol/week: 0.0 standard drinks of alcohol   Drug use: Not Currently   Sexual activity: Not Currently  Other Topics Concern   Not on file  Social History Narrative   Lives with husband Skyah Longcore).  Has 3 grown children.  Does  not work.  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   Social Determinants of Health   Financial Resource Strain: Not on file  Food Insecurity: No Food Insecurity (10/03/2019)   Hunger Vital Sign    Worried About Running Out of Food in the Last Year: Never true    Ran Out of Food in the Last Year: Never true  Transportation Needs: No Transportation Needs (10/03/2019)   PRAPARE - Administrator, Civil Service (Medical): No    Lack of Transportation (Non-Medical): No  Physical Activity: Not on file  Stress: Not on file  Social Connections: Not on file    Allergies:  Allergies  Allergen Reactions   Advil [Ibuprofen] Nausea Only   Penicillins Itching    Did it involve swelling of the face/tongue/throat, SOB, or low BP? Unknown Did it involve sudden or severe rash/hives, skin peeling, or any reaction on the inside of your mouth or nose? Unknown Did you need to seek medical attention at a hospital or doctor's office? Unknown When did it last happen? Unknown If all above answers are "NO", may proceed with cephalosporin use.    Metabolic Disorder Labs: Lab Results  Component Value Date   HGBA1C 6.4 (H) 06/01/2022   No results found for: "PROLACTIN" Lab Results  Component Value Date   CHOL 135 06/01/2022   TRIG 77 06/01/2022   HDL 56 06/01/2022   CHOLHDL 4.8 08/27/2014   VLDL 26 08/27/2014   LDLCALC 64 06/01/2022   LDLCALC 136 (H) 05/21/2021   Lab Results  Component Value Date   TSH 0.657 05/21/2021   TSH 0.667 03/02/2012    Therapeutic Level Labs: No results found for: "LITHIUM" No results found for: "VALPROATE" No results found for: "CBMZ"  Current Medications: Current Outpatient Medications  Medication Sig Dispense Refill   Deutetrabenazine (AUSTEDO XR PATIENT TITRATION) 6 & 12 & 24 MG TEPK Take 6-24 mg by mouth daily. 90 each 3   AgaMatrix Ultra-Thin Lancets MISC Check blood glucose twice daily before meals (Patient not taking:  Reported on 02/17/2022) 100 each 11   aspirin 81 MG tablet Take 1 tablet (81 mg total) by mouth daily. 100 tablet 1   carvedilol (COREG) 3.125 MG tablet Take 1 tablet (3.125 mg total) by mouth 2 (two) times daily. 180 tablet 3   Deutetrabenazine (AUSTEDO) 12 MG TABS Take 1 tablet (12 mg total) by mouth 2 (two) times daily. 60 tablet 6   glucose blood (AGAMATRIX PRESTO TEST) test strip Check blood glucose twice daily before meals (Patient not taking: Reported on 02/17/2022) 100 each 11   hydrochlorothiazide (HYDRODIURIL) 25 MG tablet TAKE 1 TABLET EVERY DAY 90 tablet 2   losartan (COZAAR) 50  MG tablet TAKE 1 TABLET EVERY DAY 90 tablet 2   melatonin 5 MG TABS Take 1 tablet (5 mg total) by mouth at bedtime. 30 tablet 3   meloxicam (MOBIC) 15 MG tablet TAKE 1 TABLET EVERY DAY WITH FOOD AS NEEDED FOR LEG PAIN 60 tablet 2   metFORMIN (GLUCOPHAGE-XR) 500 MG 24 hr tablet TAKE 1 TABLET TWICE DAILY WITH MEALS 180 tablet 2   omeprazole (PRILOSEC) 20 MG capsule TAKE 1 CAPSULE TWICE DAILY 120 capsule 5   pregabalin (LYRICA) 50 MG capsule Take 1 capsule (50 mg total) by mouth 3 (three) times daily. 270 capsule 3   rosuvastatin (CRESTOR) 20 MG tablet TAKE 1 TABLET EVERY EVENING WITH SUPPER 90 tablet 2   temazepam (RESTORIL) 30 MG capsule Take 1 capsule (30 mg total) by mouth at bedtime as needed for sleep. 30 capsule 3   traZODone (DESYREL) 100 MG tablet Take 1 tablet (100 mg total) by mouth at bedtime. 30 tablet 3   No current facility-administered medications for this visit.     Musculoskeletal: Strength & Muscle Tone: within normal limits Gait & Station: normal Patient leans: N/A  Psychiatric Specialty Exam: Review of Systems  There were no vitals taken for this visit.There is no height or weight on file to calculate BMI.  General Appearance: Well Groomed  Eye Contact:  Good  Speech:  Slurred  Volume:  Normal  Mood:  Euthymic  Affect:  Appropriate and Congruent  Thought Process:  Coherent, Goal  Directed, and Linear  Orientation:  Full (Time, Place, and Person)  Thought Content: WDL and Logical   Suicidal Thoughts:  No  Homicidal Thoughts:  No  Memory:  Immediate;   Good Recent;   Good Remote;   Good  Judgement:  Good  Insight:  Good  Psychomotor Activity:  Normal  Concentration:  Concentration: Good and Attention Span: Good  Recall:  Good  Fund of Knowledge: Good  Language: Good  Akathisia:  No  Handed:  Right  AIMS (if indicated):  done,6  Assets:  Communication Skills Desire for Improvement Financial Resources/Insurance Housing Intimacy Leisure Time Physical Health Social Support  ADL's:  Intact  Cognition: WNL  Sleep:  Good   Screenings: AIMS    Flowsheet Row Office Visit from 06/09/2022 in [facility] Office Visit from 03/12/2022 in [facility] Office Visit from 12/18/2021 in [facility] Office Visit from 09[facility]18/2021 in [facility] Office Visit from 06/19/2021 in [facility]  AIMS Total Score 6 5 21 22 23       GAD-7    Flowsheet Row Office Visit from 06/09/2022 in [facility] Office Visit from 03/12/2022 in [facility] Office Visit from 12/18/2021 in [facility] Office Visit from 09/18/2021 in [facility] Office Visit from 06/19/2021 in [facility]  Total GAD-7 Score 6 13 0 1 1      PHQ2-9    Flowsheet Row Office Visit from 06/09/2022 in [facility] Office Visit from 03/12/2022 in [facility] Office Visit from 12/18/2021 in [facility] Office Visit from 09/18/2021 in [facility] Office Visit from 06/19/2021 in [facility]  PHQ-2 Total Score 0 4 0 0 1   PHQ-9 Total Score 0 13 -- -- --  Flowsheet Row Office Visit from 12/18/2021 in [facility] Office Visit from 09/18/2021 in [facility] Office Visit from 06/19/2021 in [facility]  C-SSRS RISK CATEGORY No Risk No Risk No Risk        Assessment and Plan: Patient notes that her anxiety and depression are well-managed however notes that her TD is not.  Patient presented with symptoms of TD. She notes that she has been without her Austedo and today scored a 6 on her Aims assessment today.  Patient given Austedo starter kit test she has been without the medication for over 4 months.  She will continue all other medications as prescribed.  1. Tardive dyskinesia  Restart- Deutetrabenazine (AUSTEDO) 12 MG TABS; Take 1 tablet (12 mg total) by mouth 2 (two) times daily.  Dispense: 60 tablet; Refill: 6 Start- Deutetrabenazine (AUSTEDO XR PATIENT TITRATION) 6 & 12 & 24 MG TEPK; Take 6-24 mg by mouth daily.  Dispense: 90 each; Refill: 3  2. Insomnia due to other mental disorder  Continue- melatonin 5 MG TABS; Take 1 tablet (5 mg total) by mouth at bedtime.  Dispense: 30 tablet; Refill: 3  3. Schizophrenia, paranoid (HCC)  Continue- traZODone (DESYREL) 100 MG tablet; Take 1 tablet (100 mg total) by mouth at bedtime.  Dispense: 30 tablet; Refill: 3 Continue- temazepam (RESTORIL) 30 MG capsule; Take 1 capsule (30 mg total) by mouth at bedtime as needed for sleep.  Dispense: 30 capsule; Refill: 3    Collaboration of Care: Collaboration of Care: Other provider involved in patient's care AEB PCP  Patient/Guardian was advised Release of Information must be obtained prior to any record release in order to collaborate their care with an outside provider. Patient/Guardian was advised if they have not already done so to contact the registration department to sign all necessary forms in order for [place] to release information  regarding their care.   Consent: Patient/Guardian gives verbal consent for treatment and assignment of benefits for services provided during this visit. Patient/Guardian expressed understanding and agreed to proceed.   Follow up in 3 months   Shanna Cisco, NP 06/09/2022, 2:31 PM

## 2022-06-11 ENCOUNTER — Ambulatory Visit (HOSPITAL_COMMUNITY): Payer: Medicare HMO

## 2022-06-20 DIAGNOSIS — E1142 Type 2 diabetes mellitus with diabetic polyneuropathy: Secondary | ICD-10-CM | POA: Insufficient documentation

## 2022-06-20 MED ORDER — EMPAGLIFLOZIN 10 MG PO TABS
10.0000 mg | ORAL_TABLET | Freq: Every day | ORAL | 3 refills | Status: DC
Start: 2022-06-20 — End: 2022-08-10

## 2022-06-20 NOTE — Addendum Note (Signed)
Addended by: Marcene Duos on: 06/20/2022 09:16 AM   Modules accepted: Orders

## 2022-06-29 ENCOUNTER — Other Ambulatory Visit (HOSPITAL_COMMUNITY): Payer: Self-pay

## 2022-06-29 ENCOUNTER — Telehealth (HOSPITAL_COMMUNITY): Payer: Self-pay

## 2022-06-29 NOTE — Telephone Encounter (Signed)
Filled out PAP paperwork and faxed to [facility], Peggye Fothergill

## 2022-08-10 ENCOUNTER — Encounter: Payer: Self-pay | Admitting: Internal Medicine

## 2022-08-10 ENCOUNTER — Ambulatory Visit (INDEPENDENT_AMBULATORY_CARE_PROVIDER_SITE_OTHER): Payer: Medicare HMO | Admitting: Internal Medicine

## 2022-08-10 VITALS — BP 136/88 | HR 89 | Resp 16 | Ht 61.5 in | Wt 194.0 lb

## 2022-08-10 DIAGNOSIS — I10 Essential (primary) hypertension: Secondary | ICD-10-CM

## 2022-08-10 DIAGNOSIS — E1142 Type 2 diabetes mellitus with diabetic polyneuropathy: Secondary | ICD-10-CM | POA: Diagnosis not present

## 2022-08-10 DIAGNOSIS — M79651 Pain in right thigh: Secondary | ICD-10-CM

## 2022-08-10 DIAGNOSIS — Z23 Encounter for immunization: Secondary | ICD-10-CM | POA: Diagnosis not present

## 2022-08-10 DIAGNOSIS — M79652 Pain in left thigh: Secondary | ICD-10-CM

## 2022-08-10 DIAGNOSIS — R7989 Other specified abnormal findings of blood chemistry: Secondary | ICD-10-CM

## 2022-08-10 DIAGNOSIS — E785 Hyperlipidemia, unspecified: Secondary | ICD-10-CM | POA: Diagnosis not present

## 2022-08-10 DIAGNOSIS — K219 Gastro-esophageal reflux disease without esophagitis: Secondary | ICD-10-CM

## 2022-08-10 DIAGNOSIS — G2401 Drug induced subacute dyskinesia: Secondary | ICD-10-CM

## 2022-08-10 DIAGNOSIS — Z1211 Encounter for screening for malignant neoplasm of colon: Secondary | ICD-10-CM

## 2022-08-10 DIAGNOSIS — M797 Fibromyalgia: Secondary | ICD-10-CM

## 2022-08-10 DIAGNOSIS — E669 Obesity, unspecified: Secondary | ICD-10-CM

## 2022-08-10 MED ORDER — PREGABALIN 100 MG PO CAPS: ORAL_CAPSULE | ORAL | 3 refills | Status: DC

## 2022-08-10 MED ORDER — DAPAGLIFLOZIN PRO-METFORMIN ER 5-500 MG PO TB24: ORAL_TABLET | ORAL | 3 refills | Status: DC

## 2022-08-10 NOTE — Progress Notes (Signed)
Subjective:    Patient ID: Kristine Atkinson, female   DOB: 05-12-1955, 67 y.o.   MRN: [id number]   HPI  Asked husband, Chrissie Noa, to come in as concerned with her constant movement and not clear what she is doing with her meds.   Anxiety, depression, schizophrenia:  Monthly Aripiprazole injection stopped after 01/15/2022 as she did not like how she felt.  He husband states she did not have tardive dyskinesia when being treated with Aripiprazole, but was noted in past year here during visits.  Neither patient nor her husband feel she has had hallucinations or paranoid thinking since stopping.  She was placed on Austedo again in February.  Did not tolerate Ingrezza with GI side effects previously for control of TD symptoms.   Reportedly, TD movements of mouth, hands, arms, and legs have not decreased since starting the Austedo.   Care as per Behavioral Health  2.  Hypertension:   Not clear if she may be missing meds.  She fills her own pill box and husband never checks.  He is willing to do so now.  Note that her BP has been elevated on and off this year.    3.  DM:  A1C was tat goal at 6.4%.  She has been off atypical antipsychotic for almost 6 months now.  She never checks her sugars.  4.  Pain in hands, legs and feet.  She is not clear if she is taking Lyrica--she does net think she is receiving the med.  She also does not describe taking a medication midday.   5.  GERD:  taking omeprazole twice daily and taking with other meds.     Current Meds  Medication Sig   aspirin 81 MG tablet Take 1 tablet (81 mg total) by mouth daily.   carvedilol (COREG) 3.125 MG tablet Take 1 tablet (3.125 mg total) by mouth 2 (two) times daily.   Deutetrabenazine (AUSTEDO) 12 MG TABS Take 1 tablet (12 mg total) by mouth 2 (two) times daily.   hydrochlorothiazide (HYDRODIURIL) 25 MG tablet TAKE 1 TABLET EVERY DAY   losartan (COZAAR) 50 MG tablet TAKE 1 TABLET EVERY DAY   melatonin 5 MG TABS Take 1 tablet (5  mg total) by mouth at bedtime.   meloxicam (MOBIC) 15 MG tablet TAKE 1 TABLET EVERY DAY WITH FOOD AS NEEDED FOR LEG PAIN   metFORMIN (GLUCOPHAGE-XR) 500 MG 24 hr tablet TAKE 1 TABLET TWICE DAILY WITH MEALS   omeprazole (PRILOSEC) 20 MG capsule TAKE 1 CAPSULE TWICE DAILY   pregabalin (LYRICA) 50 MG capsule Take 1 capsule (50 mg total) by mouth 3 (three) times daily.   rosuvastatin (CRESTOR) 20 MG tablet TAKE 1 TABLET EVERY EVENING WITH SUPPER   temazepam (RESTORIL) 30 MG capsule Take 1 capsule (30 mg total) by mouth at bedtime as needed for sleep.   traZODone (DESYREL) 100 MG tablet Take 1 tablet (100 mg total) by mouth at bedtime.    Allergies  Allergen Reactions   Advil [Ibuprofen] Nausea Only   Penicillins Itching    Did it involve swelling of the face/tongue/throat, SOB, or low BP? Unknown Did it involve sudden or severe rash/hives, skin peeling, or any reaction on the inside of your mouth or nose? Unknown Did you need to seek medical attention at a hospital or doctor's office? Unknown When did it last happen? Unknown If all above answers are "NO", may proceed with cephalosporin use.     Review of Systems  Objective:   BP 136/88 (BP Location: Left Arm, Patient Position: Sitting, Cuff Size: Normal)   Pulse 89   Resp 16   Ht 5' 1.5" (1.562 m)   Wt 194 lb (88 kg)   BMI 36.06 kg/m   Physical Exam Constant movement of arms, legs, sitting forward and then back and lip puckering and smacking.  Gets up to exam table, and all of this stops as she moves smoothly to the table and once still again, the motions start back up.  Tremors viewed in right arm and felt in left and both legs. Lungs:  CTA CV:  RRR with normal heart sounds, no murmur or rub.  No carotid bruit.  Carotid radial and DP pulses normal and equal Abd:  S, NT, No HSM or mass, + BS LE:  No edema. Diabetic Foot Exam - Simple   Simple Foot Form  08/10/2022  3:00 PM  Visual Inspection No deformities, no ulcerations,  no other skin breakdown bilaterally: Yes Sensation Testing Intact to touch and monofilament testing bilaterally: Yes Pulse Check Posterior Tibialis and Dorsalis pulse intact bilaterally: Yes Comments      Assessment & Plan   Tardive Dyskinesia:  fairly profound at this point with constant movement at rest.  Will see if gets better control with Austedo.  2.  DM:  She could not afford Jardiance and never had sent.  Appears that the combination of Dapagliflozin and Metformin is covered.  Will see if affordable.  A1C is fine, though not as well controlled as previously.    3.  Peripheral neuropathy/Fibromyalgia, right CTS, thigh pain:  She is taking the pregabalin 50 mg 3 times daily--called back to confirm.  Increase to 100 mg and take just twice daily.  They will call back to confirm whether she is taking Meloxicam as well.  4.  Hypertension:  Okay today, but would like to see better control.  Will have husband purchase 4 a day dosing pill box and have her use AM, PM, and bedtime.  He will check behind her to be certain she is taking her meds.    5.   GERD:  to change Omeprazole to 40 mg in the morning 1/2 hour before breakfast and other meds.    6.  Insomnia:  will call to confirm whether taking melatonin with 2 prescription meds at bedtime.  7.  HM:  Td today.  Will return in 2 weeks for COVID and Shingrix in September.   Referral for diabetic eye exams and colonoscopy.  8.  Hyperlipidemia:  She is clear she is taking statin.

## 2022-08-21 ENCOUNTER — Encounter: Payer: Self-pay | Admitting: Gastroenterology

## 2022-08-21 ENCOUNTER — Other Ambulatory Visit: Payer: Self-pay

## 2022-08-24 ENCOUNTER — Ambulatory Visit (INDEPENDENT_AMBULATORY_CARE_PROVIDER_SITE_OTHER): Payer: Medicare HMO | Admitting: Internal Medicine

## 2022-08-24 DIAGNOSIS — Z23 Encounter for immunization: Secondary | ICD-10-CM | POA: Diagnosis not present

## 2022-08-26 ENCOUNTER — Telehealth (HOSPITAL_COMMUNITY): Payer: Self-pay | Admitting: *Deleted

## 2022-08-26 NOTE — Telephone Encounter (Signed)
Pharmacy called stating that patient medication was lost in the mail and they need a return call to authorize sending it again. It was Lyrica and Restoril. It had a tracking number on it and the number is not recognized at the [employer] facility. Asking for a return call [phone] ext [phone] per Tresa Endo. Message can be left with first and last name of person calling and that it is OK to resend, they will fax for a signature. Tresa Endo states that they have no prior record of this happening to this patient but it has happened with other patients in the past.

## 2022-08-27 ENCOUNTER — Other Ambulatory Visit (HOSPITAL_COMMUNITY): Payer: Self-pay | Admitting: Psychiatry

## 2022-08-27 DIAGNOSIS — F200 Paranoid schizophrenia: Secondary | ICD-10-CM

## 2022-08-27 MED ORDER — TEMAZEPAM 30 MG PO CAPS
30.0000 mg | ORAL_CAPSULE | Freq: Every evening | ORAL | 3 refills | Status: DC | PRN
Start: 2022-08-27 — End: 2022-09-08

## 2022-08-27 NOTE — Telephone Encounter (Signed)
[facility] called [facility] and was informed that her medication was lost in transit.  Medication (Restoril) reordered and sent to preferred pharmacy.  [facility] informed [facility] that Lyrica was not filled by provider therefore would not be resent.  They endorsed understanding and report that they would inform patient. No other concerns noted at this time.

## 2022-09-07 ENCOUNTER — Ambulatory Visit (AMBULATORY_SURGERY_CENTER): Payer: Medicare HMO

## 2022-09-07 ENCOUNTER — Telehealth: Payer: Self-pay

## 2022-09-07 ENCOUNTER — Encounter: Payer: Self-pay | Admitting: Gastroenterology

## 2022-09-07 VITALS — Ht 61.5 in | Wt 195.0 lb

## 2022-09-07 DIAGNOSIS — Z1211 Encounter for screening for malignant neoplasm of colon: Secondary | ICD-10-CM

## 2022-09-07 MED ORDER — PEG 3350-KCL-NA BICARB-NACL 420 G PO SOLR
4000.0000 mL | Freq: Once | ORAL | 0 refills | Status: AC
Start: 2022-09-07 — End: 2022-09-07

## 2022-09-07 NOTE — Telephone Encounter (Signed)
Dr. Myrtie Neither I went ahead and completed with PV with the patient and went over prep instructions. Patient does not sound SOB or anything via the phone call. I also looked thru her past visits and I don't see any mention of her requiring oxygen. Please review her chart and if you feel she needs to be seen then I will reach back out and have her scheduled for an OV.   Thank you, Previsit

## 2022-09-07 NOTE — Telephone Encounter (Signed)
Dr. Myrtie Neither,   I am completing [facility]. Pt seems to be a poor historian when it comes to her medications as well as medical history.  When I asked the patient about o2 use at home she reports to me she has been off oxygen for a while now but uses her husband at home sometimes. When asked about issues with difficulty breathing, SOB and why she is using her husbands oxygen at home her response is just to try it out and that its not often. John has red dot this patient for care here. I was going to make the pt a OV to be cleared prior to her proceeding with the colonoscopy unless you feel comfortable with proceeding. Please advise.   Thank you, Previsit

## 2022-09-07 NOTE — Progress Notes (Signed)
No egg or soy allergy known to patient  No issues known to pt with past sedation with any surgeries or procedures Patient denies ever being told they had issues or difficulty with intubation  No FH of Malignant Hyperthermia Pt is not on diet pills Pt is not on  home 02  Pt is not on blood thinners  Pt denies issues with constipation  No A fib or A flutter Have any cardiac testing pending--no  LOA: independent  Prep: 2 day Golytely   Patient's chart reviewed by Cathlyn Parsons CNRA prior to previsit and patient appropriate for the [facility].  Previsit completed and red dot placed by patient's name on their procedure day (on provider's schedule).     PV competed with patient. Prep instructions sent to patients home address. All questions answered at this time. Patient understands to call back with questions after she gets her paperwork and has reread it.

## 2022-09-08 ENCOUNTER — Encounter (HOSPITAL_COMMUNITY): Payer: Self-pay | Admitting: Psychiatry

## 2022-09-08 ENCOUNTER — Ambulatory Visit (INDEPENDENT_AMBULATORY_CARE_PROVIDER_SITE_OTHER): Payer: Medicare HMO | Admitting: Psychiatry

## 2022-09-08 DIAGNOSIS — F200 Paranoid schizophrenia: Secondary | ICD-10-CM | POA: Diagnosis not present

## 2022-09-08 DIAGNOSIS — F99 Mental disorder, not otherwise specified: Secondary | ICD-10-CM

## 2022-09-08 DIAGNOSIS — G2401 Drug induced subacute dyskinesia: Secondary | ICD-10-CM | POA: Diagnosis not present

## 2022-09-08 DIAGNOSIS — F5105 Insomnia due to other mental disorder: Secondary | ICD-10-CM

## 2022-09-08 MED ORDER — MELATONIN 5 MG PO TABS: 5.0000 mg | ORAL_TABLET | Freq: Every day | ORAL | 3 refills | Status: DC

## 2022-09-08 MED ORDER — TEMAZEPAM 22.5 MG PO CAPS
22.5000 mg | ORAL_CAPSULE | Freq: Every evening | ORAL | Status: DC | PRN
Start: 2022-09-08 — End: 2022-12-28

## 2022-09-08 MED ORDER — TRAZODONE HCL 150 MG PO TABS: 150.0000 mg | ORAL_TABLET | Freq: Every day | ORAL | 3 refills | Status: DC

## 2022-09-08 MED ORDER — AUSTEDO XR 18 MG PO TB24
18.0000 mg | ORAL_TABLET | Freq: Two times a day (BID) | ORAL | 11 refills | Status: DC
Start: 2022-09-08 — End: 2022-09-23

## 2022-09-08 NOTE — Progress Notes (Signed)
BH MD/PA/NP OP Progress Note  09/08/2022 3:47 PM Kristine Atkinson  MRN:  [id number]  Chief Complaint: " the shaking is not going to go away"  HPI: 67 year old female seen today for follow up psychiatric evaluation. She has a psychiatric history of anxiety, depression, paranoid schizophrenia, and unspecified adult personality disorder. She is currently managed on Austedo 12 mg twice daily, Trazodone 100 mg nightly as needed, melatonin 5 mg, and Restoril 30 mg daily as needed.  She reports her medications are somewhat effective in managing her psychiatric conditions.    Today she is well groomed, pleasant, cooperative, and engaged in conversation. Patient informed writer that her shaking will not go away. She notes that she now takes Austedo infrequently. Patient trailed ingrezza but it caused GI upset. She notes that when she takes Austedo her symptoms somewhat improve. Today provider conducted an AIMS assessment and patient scored a 14.   Since her last visit her depression has increased. Today provider conducted a GAD 7 and patient scored a 7, at her last visit she scored a 6. Provider also conducted PHQ-9 and patient scored a 14, at her last visit she scored a 0.  She endorses fluctuations in sleep noting that she sleeps 3 to 4 hours at times and adequate appetite.  Today she denies SI/HI/AVH, mania, or paranoia.  Patient informed that Restoril should not be taken for an extended.  She endorsed understanding however was hesitant to reduction in her dose.  Provider reduced Restoril from 30 mg to 22.5 mg as needed.  Provider will continue this taper.  Patient also agreeable to increase Austedo 12 mg twice daily to 18 mg twice daily to help manage symptoms of TD.  Trazodone increased from 100 mg to 150 mg to help manage anxiety, depression, and sleep.  She will continue all other medications as prescribed.  No other concerns noted at this time.  Visit Diagnosis:    ICD-10-CM   1. Schizophrenia,  paranoid (HCC)  F20.0 temazepam (RESTORIL) 22.5 MG capsule    traZODone (DESYREL) 150 MG tablet    2. Tardive dyskinesia  G24.01 Deutetrabenazine ER (AUSTEDO XR) 18 MG TB24    3. Insomnia due to other mental disorder  F51.05 melatonin 5 MG TABS   F99        Past Psychiatric History: anxiety, depression, paranoid schizophrenia, and unspecified adult personality disorder  Past Medical History:  Past Medical History:  Diagnosis Date   Cellulitis 06/06/2013   Chest pain 06/07/2015   Chronic pain    Cocaine abuse (HCC) 09/09/2012   Cocaine positive on UDS 09/09/2012.     Diabetes mellitus    Elevated serum creatinine 08/14/2013   Fibromyalgia    Greater trochanteric bursitis of left hip 05/29/2014   Hyperlipidemia    Hypertension    Pneumonia due to COVID-19 virus 02/11/2019   Hospitalized with hypoxia, but did not require intubation/ventilation.   Schizophrenia (HCC)    Syncope 08/14/2013    Past Surgical History:  Procedure Laterality Date   OOPHORECTOMY     ectopic    Family Psychiatric History: None reported   Family History:  Family History  Problem Relation Age of Onset   Hypertension Mother    Cancer Maternal Aunt        colon cancer   Colon cancer Neg Hx    Colon polyps Neg Hx    Rectal cancer Neg Hx    Stomach cancer Neg Hx    Esophageal cancer Neg Hx  Social History:  Social History   Socioeconomic History   Marital status: Married    Spouse name: Not on file   Number of children: Not on file   Years of education: Not on file   Highest education level: Not on file  Occupational History   Not on file  Tobacco Use   Smoking status: Former    Current packs/day: 0.00    Average packs/day: 0.2 packs/day for 42.4 years (8.5 ttl pk-yrs)    Types: Cigarettes    Start date: 01/12/1973    Quit date: 06/13/2015    Years since quitting: 7.2   Smokeless tobacco: Never   Tobacco comments:    smokes about 3-4 cigs per day  as of  02/2014  Vaping Use    Vaping status: Never Used  Substance and Sexual Activity   Alcohol use: No    Alcohol/week: 0.0 standard drinks of alcohol   Drug use: Not Currently   Sexual activity: Not Currently  Other Topics Concern   Not on file  Social History Narrative   Lives with husband Kristine Atkinson).  Has 3 grown children.  Does not work.  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   Social Determinants of Health   Financial Resource Strain: Not on file  Food Insecurity: No Food Insecurity (10/03/2019)   Hunger Vital Sign    Worried About Running Out of Food in the Last Year: Never true    Ran Out of Food in the Last Year: Never true  Transportation Needs: No Transportation Needs (10/03/2019)   PRAPARE - Administrator, Civil Service (Medical): No    Lack of Transportation (Non-Medical): No  Physical Activity: Not on file  Stress: Not on file  Social Connections: Not on file    Allergies:  Allergies  Allergen Reactions   Advil [Ibuprofen] Nausea Only   Penicillins Itching    Did it involve swelling of the face/tongue/throat, SOB, or low BP? Unknown Did it involve sudden or severe rash/hives, skin peeling, or any reaction on the inside of your mouth or nose? Unknown Did you need to seek medical attention at a hospital or doctor's office? Unknown When did it last happen? Unknown If all above answers are "NO", may proceed with cephalosporin use.    Metabolic Disorder Labs: Lab Results  Component Value Date   HGBA1C 6.1 (H) 08/10/2022   No results found for: "PROLACTIN" Lab Results  Component Value Date   CHOL 135 06/01/2022   TRIG 77 06/01/2022   HDL 56 06/01/2022   CHOLHDL 4.8 08/27/2014   VLDL 26 08/27/2014   LDLCALC 64 06/01/2022   LDLCALC 136 (H) 05/21/2021   Lab Results  Component Value Date   TSH 0.657 05/21/2021   TSH 0.667 03/02/2012    Therapeutic Level Labs: No results found for: "LITHIUM" No results found for: "VALPROATE" No  results found for: "CBMZ"  Current Medications: Current Outpatient Medications  Medication Sig Dispense Refill   Deutetrabenazine ER (AUSTEDO XR) 18 MG TB24 Take 18 mg by mouth 2 (two) times daily. 60 tablet 11   AgaMatrix Ultra-Thin Lancets MISC Check blood glucose twice daily before meals (Patient not taking: Reported on 02/17/2022) 100 each 11   aspirin 81 MG tablet Take 1 tablet (81 mg total) by mouth daily. 100 tablet 1   carvedilol (COREG) 3.125 MG tablet Take 1 tablet (3.125 mg total) by mouth 2 (two) times daily. 180 tablet 3   Dapagliflozin Pro-metFORMIN  ER 5-500 MG TB24 1 tab by mouth twice daily with meals 180 tablet 3   glucose blood (AGAMATRIX PRESTO TEST) test strip Check blood glucose twice daily before meals (Patient not taking: Reported on 02/17/2022) 100 each 11   hydrochlorothiazide (HYDRODIURIL) 25 MG tablet TAKE 1 TABLET EVERY DAY 90 tablet 2   losartan (COZAAR) 50 MG tablet TAKE 1 TABLET EVERY DAY (Patient not taking: Reported on 09/07/2022) 90 tablet 2   melatonin 5 MG TABS Take 1 tablet (5 mg total) by mouth at bedtime. 30 tablet 3   meloxicam (MOBIC) 15 MG tablet TAKE 1 TABLET EVERY DAY WITH FOOD AS NEEDED FOR LEG PAIN 60 tablet 2   omeprazole (PRILOSEC) 20 MG capsule TAKE 1 CAPSULE TWICE DAILY 120 capsule 5   pregabalin (LYRICA) 100 MG capsule 1 cap by mouth twice daily (Patient not taking: Reported on 09/07/2022) 180 capsule 3   rosuvastatin (CRESTOR) 20 MG tablet TAKE 1 TABLET EVERY EVENING WITH SUPPER 90 tablet 2   temazepam (RESTORIL) 22.5 MG capsule Take 1 capsule (22.5 mg total) by mouth at bedtime as needed for sleep.     traZODone (DESYREL) 150 MG tablet Take 1 tablet (150 mg total) by mouth at bedtime. 30 tablet 3   No current facility-administered medications for this visit.     Musculoskeletal: Strength & Muscle Tone: within normal limits Gait & Station: normal Patient leans: N/A  Psychiatric Specialty Exam: Review of Systems  Blood pressure 106/79,  pulse 93, temperature (!) 96.8 F (36 C), weight 196 lb 9.6 oz (89.2 kg), SpO2 96%.Body mass index is 36.55 kg/m.  General Appearance: Well Groomed  Eye Contact:  Good  Speech:  Slurred  Volume:  Normal  Mood:  Depressed  Affect:  Appropriate and Congruent  Thought Process:  Coherent, Goal Directed, and Linear  Orientation:  Full (Time, Place, and Person)  Thought Content: WDL and Logical   Suicidal Thoughts:  No  Homicidal Thoughts:  No  Memory:  Immediate;   Good Recent;   Good Remote;   Good  Judgement:  Good  Insight:  Good  Psychomotor Activity:  Normal  Concentration:  Concentration: Good and Attention Span: Good  Recall:  Good  Fund of Knowledge: Good  Language: Good  Akathisia:  No  Handed:  Right  AIMS (if indicated):  done,14  Assets:  Communication Skills Desire for Improvement Financial Resources/Insurance Housing Intimacy Leisure Time Physical Health Social Support  ADL's:  Intact  Cognition: WNL  Sleep:  Fair   Screenings: AIMS    Flowsheet Row Clinical Support from 09/08/2022 in [facility] Office Visit from 06/09/2022 in [facility] Office Visit from 03/12/2022 in [facility] Office Visit from 12/18/2021 in [facility] Office Visit from 09/18/2021 in [facility]  AIMS Total Score 14 6 5 21 22       GAD-7    Flowsheet Row Clinical Support from 09/08/2022 in [facility] Office Visit from 06/09/2022 in [facility] Office Visit from 03/12/2022 in [facility] Office Visit from 12/18/2021 in [facility] Office Visit from 09/18/2021 in [facility]  Total GAD-7 Score 7 6 13  0 1      PHQ2-9    Flowsheet Row Clinical Support from 09/08/2022 in [facility]  Office Visit from 06/09/2022 in [facility]  Office Visit from 03/12/2022 in [facility] Office Visit from 12/18/2021 in [facility] Office Visit from 09/18/2021 in [facility]  PHQ-2 Total Score 3 0 4 0 0  PHQ-9 Total Score 14 0 13 -- --      Flowsheet Row Office Visit from 12/18/2021 in [facility] Office Visit from 09/18/2021 in [facility] Office Visit from 06/19/2021 in [facility]  C-SSRS RISK CATEGORY No Risk No Risk No Risk        Assessment and Plan: Patient notes that depression haas increased since her last vist. She also continue to suffer from symptom of TD. Patient informed that Restoril should not be taken for an extended.  She endorsed understanding however was hesitant to reduction in her dose.  Provider reduced Restoril from 30 mg to 22.5 mg as needed.  Provider will continue this taper.  Patient also agreeable to increase Austedo 12 mg twice daily to 18 mg twice daily to help manage symptoms of TD.  Trazodone increased from 100 mg to 150 mg to help manage anxiety, depression, and sleep.  She will continue all other medications as prescribed.  1. Schizophrenia, paranoid (HCC)  Reduced- temazepam (RESTORIL) 22.5 MG capsule; Take 1 capsule (22.5 mg total) by mouth at bedtime as needed for sleep. Increased- traZODone (DESYREL) 150 MG tablet; Take 1 tablet (150 mg total) by mouth at bedtime.  Dispense: 30 tablet; Refill: 3  2. Tardive dyskinesia  Increased- Deutetrabenazine ER (AUSTEDO XR) 18 MG TB24; Take 18 mg by mouth 2 (two) times daily.  Dispense: 60 tablet; Refill: 11  3. Insomnia due to other mental disorder  Continue- melatonin 5 MG TABS; Take 1 tablet (5 mg total) by mouth at bedtime.  Dispense: 30 tablet; Refill: 3   Collaboration of Care: Collaboration of Care: Other  provider involved in patient's care AEB PCP  Patient/Guardian was advised Release of Information must be obtained prior to any record release in order to collaborate their care with an outside provider. Patient/Guardian was advised if they have not already done so to contact the registration department to sign all necessary forms in order for [place] to release information regarding their care.   Consent: Patient/Guardian gives verbal consent for treatment and assignment of benefits for services provided during this visit. Patient/Guardian expressed understanding and agreed to proceed.   Follow up in 3 months   Shanna Cisco, NP 09/08/2022, 3:47 PM

## 2022-09-08 NOTE — Telephone Encounter (Signed)
Thank you for the note.  I agree that her chart review does not reveal anything indicating chronic lung disease and need for oxygen therapy.  As far as I can see, she can have her colonoscopy as scheduled in the [facility] as long as anesthesia has cleared her as well.  HD

## 2022-09-09 ENCOUNTER — Ambulatory Visit (HOSPITAL_COMMUNITY): Payer: Medicare HMO | Admitting: Psychiatry

## 2022-09-10 NOTE — Telephone Encounter (Addendum)
Patient called requesting to speak with a nurse, states she has no idea how to prep and needs to go over the instructions again.

## 2022-09-10 NOTE — Telephone Encounter (Signed)
Attempt to reach pt. LM with contact # for call back

## 2022-09-10 NOTE — Telephone Encounter (Signed)
Carefully reviewed instructions with pt after she refused to come in for anothr pre-visit in person. Had pt read back what she wrote for instructions. Pt was able to read back what RN  Instructed. Instructed pt to be on the look out for the written version to come in the mail and read over them and call if she has any questions.  Verified pt has # to call.

## 2022-09-11 NOTE — Telephone Encounter (Signed)
Patient called requested a call back from a nurse before 4 pm if possible so the instructions can be given to her spouse.

## 2022-09-15 ENCOUNTER — Ambulatory Visit: Payer: Medicare HMO | Admitting: Nurse Practitioner

## 2022-09-18 ENCOUNTER — Telehealth (HOSPITAL_COMMUNITY): Payer: Self-pay

## 2022-09-18 NOTE — Telephone Encounter (Signed)
Medication management - Prior authorization for patient's prescribed Deutetrabenzine ER (Austedo XR) 18 mg, 2 per day completed online wiht CoverMyMeds and sent to pt's [facility] for review. Decision pending.

## 2022-09-21 NOTE — Telephone Encounter (Signed)
Thank you for this update 

## 2022-09-22 ENCOUNTER — Telehealth (HOSPITAL_COMMUNITY): Payer: Self-pay | Admitting: *Deleted

## 2022-09-22 NOTE — Telephone Encounter (Signed)
Pharmacy called stating that Austedo XR has only been approved for 36mg  and not 18mg  BID. Asking if the MD would like to do 36mg  once daily that will be covered by insurance? Message sent to MD for review.

## 2022-09-23 ENCOUNTER — Telehealth: Payer: Self-pay | Admitting: Gastroenterology

## 2022-09-23 ENCOUNTER — Other Ambulatory Visit (HOSPITAL_COMMUNITY): Payer: Self-pay | Admitting: Psychiatry

## 2022-09-23 DIAGNOSIS — G2401 Drug induced subacute dyskinesia: Secondary | ICD-10-CM

## 2022-09-23 MED ORDER — AUSTEDO XR 36 MG PO TB24
36.0000 mg | ORAL_TABLET | Freq: Every day | ORAL | 3 refills | Status: DC
Start: 2022-09-23 — End: 2022-12-28

## 2022-09-23 NOTE — Telephone Encounter (Signed)
Austedo XR 36 mg ordered and sent to preferred pharmacy.

## 2022-09-23 NOTE — Telephone Encounter (Signed)
Inbound call from patient stating she is scheduled for a colonoscopy on Monday 9/16 at 4:00 with Dr. Myrtie Neither. Patient is requesting a call to discuss her medications as well as questions regarding she can and can not eat. Please advise.

## 2022-09-23 NOTE — Telephone Encounter (Signed)
Called and spoke with patient-patient had multiple questions concerning foods and medications related to the prep and when/if she can do "certain things"; instructions reviewed with patient thoroughly -more questions asked/answered; Patient advised to call back to the office at [phone] should questions/concerns arise; Patient verbalized understanding of information/instructions;

## 2022-09-23 NOTE — Telephone Encounter (Signed)
PT calling to get an update to speak about medications. Please advise

## 2022-09-28 ENCOUNTER — Encounter: Payer: Self-pay | Admitting: Gastroenterology

## 2022-09-28 ENCOUNTER — Ambulatory Visit: Payer: Medicare HMO | Admitting: Gastroenterology

## 2022-09-28 VITALS — BP 151/82 | HR 92 | Temp 98.0°F | Resp 13 | Ht 61.5 in | Wt 195.0 lb

## 2022-09-28 DIAGNOSIS — Z1211 Encounter for screening for malignant neoplasm of colon: Secondary | ICD-10-CM

## 2022-09-28 DIAGNOSIS — D122 Benign neoplasm of ascending colon: Secondary | ICD-10-CM | POA: Diagnosis not present

## 2022-09-28 DIAGNOSIS — D120 Benign neoplasm of cecum: Secondary | ICD-10-CM

## 2022-09-28 DIAGNOSIS — K635 Polyp of colon: Secondary | ICD-10-CM | POA: Diagnosis not present

## 2022-09-28 MED ORDER — SODIUM CHLORIDE 0.9 % IV SOLN: 500.0000 mL | Freq: Once | INTRAVENOUS | Status: DC

## 2022-09-28 NOTE — Progress Notes (Signed)
Pt's states no medical or surgical changes since previsit or office visit. 

## 2022-09-28 NOTE — Progress Notes (Unsigned)
History and Physical:  This patient presents for endoscopic testing for: Encounter Diagnosis  Name Primary?   Special screening for malignant neoplasms, colon Yes    Average risk for colorectal cancer.   No polyps on last colonoscopy (outside clinic) in 2013 - fair prep reported on that exam. Patient denies chronic abdominal pain, rectal bleeding, constipation or diarrhea.    Patient is otherwise without complaints or active issues today.   Past Medical History: Past Medical History:  Diagnosis Date   Cellulitis 06/06/2013   Chest pain 06/07/2015   Chronic pain    Cocaine abuse (HCC) 09/09/2012   Cocaine positive on UDS 09/09/2012.     Diabetes mellitus    Elevated serum creatinine 08/14/2013   Fibromyalgia    Greater trochanteric bursitis of left hip 05/29/2014   Hyperlipidemia    Hypertension    Pneumonia due to COVID-19 virus 02/11/2019   Hospitalized with hypoxia, but did not require intubation/ventilation.   Schizophrenia (HCC)    Syncope 08/14/2013     Past Surgical History: Past Surgical History:  Procedure Laterality Date   OOPHORECTOMY     ectopic    Allergies: Allergies  Allergen Reactions   Advil [Ibuprofen] Nausea Only   Penicillins Itching    Did it involve swelling of the face/tongue/throat, SOB, or low BP? Unknown Did it involve sudden or severe rash/hives, skin peeling, or any reaction on the inside of your mouth or nose? Unknown Did you need to seek medical attention at a hospital or doctor's office? Unknown When did it last happen? Unknown If all above answers are "NO", may proceed with cephalosporin use.    Outpatient Meds: Current Outpatient Medications  Medication Sig Dispense Refill   aspirin 81 MG tablet Take 1 tablet (81 mg total) by mouth daily. 100 tablet 1   carvedilol (COREG) 3.125 MG tablet Take 1 tablet (3.125 mg total) by mouth 2 (two) times daily. 180 tablet 3   Dapagliflozin Pro-metFORMIN ER 5-500 MG TB24 1 tab by mouth  twice daily with meals 180 tablet 3   hydrochlorothiazide (HYDRODIURIL) 25 MG tablet TAKE 1 TABLET EVERY DAY 90 tablet 2   losartan (COZAAR) 50 MG tablet TAKE 1 TABLET EVERY DAY 90 tablet 2   melatonin 5 MG TABS Take 1 tablet (5 mg total) by mouth at bedtime. 30 tablet 3   rosuvastatin (CRESTOR) 20 MG tablet TAKE 1 TABLET EVERY EVENING WITH SUPPER 90 tablet 2   temazepam (RESTORIL) 22.5 MG capsule Take 1 capsule (22.5 mg total) by mouth at bedtime as needed for sleep.     traZODone (DESYREL) 150 MG tablet Take 1 tablet (150 mg total) by mouth at bedtime. 30 tablet 3   AgaMatrix Ultra-Thin Lancets MISC Check blood glucose twice daily before meals (Patient not taking: Reported on 02/17/2022) 100 each 11   Deutetrabenazine ER (AUSTEDO XR) 36 MG TB24 Take 36 mg by mouth daily in the afternoon. 30 tablet 3   glucose blood (AGAMATRIX PRESTO TEST) test strip Check blood glucose twice daily before meals 100 each 11   meloxicam (MOBIC) 15 MG tablet TAKE 1 TABLET EVERY DAY WITH FOOD AS NEEDED FOR LEG PAIN 60 tablet 2   omeprazole (PRILOSEC) 20 MG capsule TAKE 1 CAPSULE TWICE DAILY 120 capsule 5   pregabalin (LYRICA) 100 MG capsule 1 cap by mouth twice daily (Patient not taking: Reported on 09/07/2022) 180 capsule 3   Current Facility-Administered Medications  Medication Dose Route Frequency Provider Last Rate Last Admin   0.9 %  sodium chloride infusion  500 mL Intravenous Once Sherrilyn Rist, MD          ___________________________________________________________________ Objective   Exam:  BP (!) 153/86   Pulse 100   Temp 98 F (36.7 C)   Ht 5' 1.5" (1.562 m)   Wt 195 lb (88.5 kg)   SpO2 97%   BMI 36.25 kg/m   CV: regular , S1/S2 Resp: clear to auscultation bilaterally, normal RR and effort noted GI: soft, no tenderness, with active bowel sounds.   Assessment: Encounter Diagnosis  Name Primary?   Special screening for malignant neoplasms, colon Yes      Plan: Colonoscopy   The benefits and risks of the planned procedure were described in detail with the patient or (when appropriate) their health care proxy.  Risks were outlined as including, but not limited to, bleeding, infection, perforation, adverse medication reaction leading to cardiac or pulmonary decompensation, pancreatitis (if ERCP).  The limitation of incomplete mucosal visualization was also discussed.  No guarantees or warranties were given.  The patient is appropriate for an endoscopic procedure in the ambulatory setting.   - Amada Jupiter, MD

## 2022-09-28 NOTE — Patient Instructions (Addendum)
Resume previous diet Continue present medications Await pathology results  Handouts/information given for polyps, diverticulosis  YOU HAD AN ENDOSCOPIC PROCEDURE TODAY AT THE [facility]:   Refer to the procedure report that was given to you for any specific questions about what was found during the examination.  If the procedure report does not answer your questions, please call your gastroenterologist to clarify.  If you requested that your care partner not be given the details of your procedure findings, then the procedure report has been included in a sealed envelope for you to review at your convenience later.  YOU SHOULD EXPECT: Some feelings of bloating in the abdomen. Passage of more gas than usual.  Walking can help get rid of the air that was put into your GI tract during the procedure and reduce the bloating. If you had a lower endoscopy (such as a colonoscopy or flexible sigmoidoscopy) you may notice spotting of blood in your stool or on the toilet paper. If you underwent a bowel prep for your procedure, you may not have a normal bowel movement for a few days.  Please Note:  You might notice some irritation and congestion in your nose or some drainage.  This is from the oxygen used during your procedure.  There is no need for concern and it should clear up in a day or so.  SYMPTOMS TO REPORT IMMEDIATELY:  Following lower endoscopy (colonoscopy):  Excessive amounts of blood in the stool  Significant tenderness or worsening of abdominal pains  Swelling of the abdomen that is new, acute  Fever of 100F or higher  For urgent or emergent issues, a gastroenterologist can be reached at any hour by calling [phone]. Do not use MyChart messaging for urgent concerns.    DIET:  We do recommend a small meal at first, but then you may proceed to your regular diet.  Drink plenty of fluids but you should avoid alcoholic beverages for 24 hours.  ACTIVITY:  You should plan to  take it easy for the rest of today and you should NOT DRIVE or use heavy machinery until tomorrow (because of the sedation medicines used during the test).    FOLLOW UP: Our staff will call the number listed on your records the next business day following your procedure.  We will call around 7:15- 8:00 am to check on you and address any questions or concerns that you may have regarding the information given to you following your procedure. If we do not reach you, we will leave a message.     If any biopsies were taken you will be contacted by phone or by letter within the next 1-3 weeks.  Please call us at [phone] if you have not heard about the biopsies in 3 weeks.    SIGNATURES/CONFIDENTIALITY: You and/or your care partner have signed paperwork which will be entered into your electronic medical record.  These signatures attest to the fact that that the information above on your After Visit Summary has been reviewed and is understood.  Full responsibility of the confidentiality of this discharge information lies with you and/or your care-partner.

## 2022-09-28 NOTE — Progress Notes (Unsigned)
Uneventful anesthetic. Report to pacu rn. Vss. Care resumed by rn. 

## 2022-09-28 NOTE — Op Note (Signed)
[facility] Patient Name: Kristine Atkinson Procedure Date: 09/28/2022 3:59 PM MRN: [id number] Endoscopist: Sherilyn Cooter L. Myrtie Neither , MD, [id number] Age: 67 Referring MD:  Date of Birth: 1955/11/27 Gender: Female Account #: [id number] Procedure:                Colonoscopy Indications:              Screening for colorectal malignant neoplasm                           no polyps on 2013 colonoscopy at outside clinic                            (fair prep on that exam) Medicines:                Monitored Anesthesia Care Procedure:                Pre-Anesthesia Assessment:                           - Prior to the procedure, a History and Physical                            was performed, and patient medications and                            allergies were reviewed. The patient's tolerance of                            previous anesthesia was also reviewed. The risks                            and benefits of the procedure and the sedation                            options and risks were discussed with the patient.                            All questions were answered, and informed consent                            was obtained. Prior Anticoagulants: The patient has                            taken no anticoagulant or antiplatelet agents. ASA                            Grade Assessment: III - A patient with severe                            systemic disease. After reviewing the risks and                            benefits, the patient was deemed in satisfactory  condition to undergo the procedure.                           After obtaining informed consent, the colonoscope                            was passed under direct vision. Throughout the                            procedure, the patient's blood pressure, pulse, and                            oxygen saturations were monitored continuously. The                            CF [id number] #[id number] was introduced  through the anus                            and advanced to the the cecum, identified by                            appendiceal orifice and ileocecal valve. The                            colonoscopy was performed without difficulty. The                            patient tolerated the procedure well. The quality                            of the bowel preparation was good. The ileocecal                            valve, appendiceal orifice, and rectum were                            photographed. Scope In: 4:13:26 PM Scope Out: 4:30:50 PM Scope Withdrawal Time: 0 hours 14 minutes 36 seconds  Total Procedure Duration: 0 hours 17 minutes 24 seconds  Findings:                 The perianal and digital rectal examinations were                            normal.                           Repeat examination of right colon under NBI                            performed.                           Three sessile and semi-sessile polyps were found in  the ascending colon and cecum. The polyps were                            diminutive in size. These polyps were removed with                            a cold snare. Resection and retrieval were complete.                           Multiple diverticula were found in the left colon                            and right colon.                           The exam was otherwise without abnormality on                            direct and retroflexion views. Complications:            No immediate complications. Estimated Blood Loss:     Estimated blood loss was minimal. Impression:               - Three diminutive polyps in the ascending colon                            and in the cecum, removed with a cold snare.                            Resected and retrieved.                           - Diverticulosis in the left colon and in the right                            colon.                           - The examination was otherwise  normal on direct                            and retroflexion views. Recommendation:           - Patient has a contact number available for                            emergencies. The signs and symptoms of potential                            delayed complications were discussed with the                            patient. Return to normal activities tomorrow.                            Written discharge instructions  were provided to the                            patient.                           - Resume previous diet.                           - Continue present medications.                           - Await pathology results.                           - Repeat colonoscopy is recommended for                            surveillance. The colonoscopy date will be                            determined after pathology results from today's                            exam become available for review. Sheridan Hew L. Myrtie Neither, MD 09/28/2022 4:36:30 PM This report has been signed electronically.

## 2022-09-28 NOTE — Progress Notes (Unsigned)
Called to room to assist during endoscopic procedure.  Patient ID and intended procedure confirmed with present staff. Received instructions for my participation in the procedure from the performing physician.  

## 2022-09-29 ENCOUNTER — Ambulatory Visit: Payer: Medicare HMO | Admitting: Internal Medicine

## 2022-09-29 ENCOUNTER — Telehealth: Payer: Self-pay | Admitting: *Deleted

## 2022-09-29 NOTE — Telephone Encounter (Signed)
No answer for post procedure call back. Left VM.

## 2022-09-30 ENCOUNTER — Ambulatory Visit: Payer: Medicare HMO | Admitting: Internal Medicine

## 2022-10-01 ENCOUNTER — Ambulatory Visit: Payer: Medicare HMO | Admitting: Internal Medicine

## 2022-10-02 LAB — SURGICAL PATHOLOGY

## 2022-10-05 ENCOUNTER — Ambulatory Visit (INDEPENDENT_AMBULATORY_CARE_PROVIDER_SITE_OTHER): Payer: Medicare HMO | Admitting: Internal Medicine

## 2022-10-05 DIAGNOSIS — Z23 Encounter for immunization: Secondary | ICD-10-CM

## 2022-10-06 ENCOUNTER — Encounter: Payer: Self-pay | Admitting: Gastroenterology

## 2022-10-06 ENCOUNTER — Other Ambulatory Visit: Payer: Self-pay

## 2022-10-06 MED ORDER — XIGDUO XR 5-500 MG PO TB24: 1.0000 | ORAL_TABLET | Freq: Two times a day (BID) | ORAL | 3 refills | Status: DC

## 2022-10-19 ENCOUNTER — Other Ambulatory Visit: Payer: Self-pay | Admitting: Internal Medicine

## 2022-11-23 LAB — HM DIABETES EYE EXAM

## 2022-12-05 ENCOUNTER — Other Ambulatory Visit: Payer: Self-pay | Admitting: Internal Medicine

## 2022-12-08 ENCOUNTER — Encounter (HOSPITAL_COMMUNITY): Payer: Medicare HMO | Admitting: Psychiatry

## 2022-12-08 ENCOUNTER — Other Ambulatory Visit: Payer: Self-pay | Admitting: Internal Medicine

## 2022-12-17 ENCOUNTER — Encounter: Payer: Self-pay | Admitting: Internal Medicine

## 2022-12-17 ENCOUNTER — Ambulatory Visit: Payer: Medicare HMO | Admitting: Internal Medicine

## 2022-12-17 VITALS — BP 138/76 | HR 96 | Resp 20 | Ht 61.5 in | Wt 192.0 lb

## 2022-12-17 DIAGNOSIS — G2401 Drug induced subacute dyskinesia: Secondary | ICD-10-CM

## 2022-12-17 DIAGNOSIS — Z780 Asymptomatic menopausal state: Secondary | ICD-10-CM

## 2022-12-17 DIAGNOSIS — B351 Tinea unguium: Secondary | ICD-10-CM

## 2022-12-17 DIAGNOSIS — I10 Essential (primary) hypertension: Secondary | ICD-10-CM | POA: Diagnosis not present

## 2022-12-17 DIAGNOSIS — Z0000 Encounter for general adult medical examination without abnormal findings: Secondary | ICD-10-CM

## 2022-12-17 DIAGNOSIS — E785 Hyperlipidemia, unspecified: Secondary | ICD-10-CM

## 2022-12-17 DIAGNOSIS — E1142 Type 2 diabetes mellitus with diabetic polyneuropathy: Secondary | ICD-10-CM | POA: Diagnosis not present

## 2022-12-17 DIAGNOSIS — B353 Tinea pedis: Secondary | ICD-10-CM

## 2022-12-17 DIAGNOSIS — E669 Obesity, unspecified: Secondary | ICD-10-CM

## 2022-12-17 DIAGNOSIS — Z79899 Other long term (current) drug therapy: Secondary | ICD-10-CM

## 2022-12-17 MED ORDER — PREGABALIN 100 MG PO CAPS: ORAL_CAPSULE | ORAL | 3 refills | Status: DC

## 2022-12-17 MED ORDER — CALCIUM CITRATE 250 MG PO TABS: ORAL_TABLET | ORAL | 3 refills | Status: DC

## 2022-12-17 MED ORDER — ROSUVASTATIN CALCIUM 20 MG PO TABS: ORAL_TABLET | ORAL | 3 refills | Status: DC

## 2022-12-17 MED ORDER — MELOXICAM 15 MG PO TABS: ORAL_TABLET | ORAL | 5 refills | Status: DC

## 2022-12-17 MED ORDER — LOSARTAN POTASSIUM 50 MG PO TABS: 50.0000 mg | ORAL_TABLET | Freq: Every day | ORAL | 3 refills | Status: DC

## 2022-12-17 MED ORDER — TERBINAFINE HCL 1 % EX CREA: TOPICAL_CREAM | CUTANEOUS | 1 refills | Status: DC

## 2022-12-17 MED ORDER — HYDROCHLOROTHIAZIDE 25 MG PO TABS: 25.0000 mg | ORAL_TABLET | Freq: Every day | ORAL | 3 refills | Status: DC

## 2022-12-17 MED ORDER — OMEPRAZOLE 20 MG PO CPDR: 20.0000 mg | DELAYED_RELEASE_CAPSULE | Freq: Two times a day (BID) | ORAL | 5 refills | Status: DC

## 2022-12-17 MED ORDER — XIGDUO XR 5-500 MG PO TB24: 1.0000 | ORAL_TABLET | Freq: Two times a day (BID) | ORAL | 3 refills | Status: DC

## 2022-12-17 MED ORDER — CARVEDILOL 3.125 MG PO TABS: 3.1250 mg | ORAL_TABLET | Freq: Two times a day (BID) | ORAL | 3 refills | Status: DC

## 2022-12-17 NOTE — Progress Notes (Signed)
Subjective:    Patient ID: Kristine Atkinson, female   DOB: 1955-09-19, 67 y.o.   MRN: [id number]   HPI  Here for CPE, but not interested in having that exam done today.  CPE without pap  1.  Pap:  Last pap was in 2021 and refuses to have done today.  Previously normal and this would likely be her last pap unless symptoms.    2.  Mammogram:  Last 04/2022.  Normal.  No family history of breast cancer.    3.  Osteoprevention:  Drinks milk or eats cheese at times.    4.  Guaiac Cards/FIT:  Last 09/2019 and negative.    5.  Colonoscopy:  Had colonoscopy with Dr. Myrtie Neither in September.  1 adenoma without dysplasia.  Plan for repeat in 2029.    6.  Immunizations:  Needs current COVID vaccine. Immunization History  Administered Date(s) Administered   Fluad Trivalent(High Dose 65+) 10/05/2022   Influenza Inj Mdck Quad Pf 11/23/2016, 11/25/2018   Influenza,inj,Quad PF,6+ Mos 09/30/2012, 10/23/2013, 11/16/2014, 09/26/2015   Influenza-Unspecified 10/14/2020, 10/14/2021   Moderna Covid-19 Fall Seasonal Vaccine 63yrs & older 08/24/2022   Moderna Covid-19 Vaccine Bivalent Booster 10yrs & up 10/14/2020   Moderna SARS-COV2 Booster Vaccination 11/16/2019   Moderna Sars-Covid-2 Vaccination 04/06/2019, 05/09/2019   PNEUMOCOCCAL CONJUGATE-20 02/17/2022   Pneumococcal Polysaccharide-23 07/14/2011, 02/12/2019   Td 08/10/2022   Tdap 07/14/2011   Zoster Recombinant(Shingrix) 03/24/2022, 10/05/2022     7.  Glucose/Cholesterol:  A1C was 6.1% back in July.  Last cholesterol was at goal in May.  Has difficulties getting here in mornings for fasting labs as husband has to drive her.   Lipid Panel     Component Value Date/Time   CHOL 135 06/01/2022 1416   TRIG 77 06/01/2022 1416   HDL 56 06/01/2022 1416   CHOLHDL 4.8 08/27/2014 0902   VLDL 26 08/27/2014 0902   LDLCALC 64 06/01/2022 1416   LABVLDL 15 06/01/2022 1416     Current Meds  Medication Sig   aspirin 81 MG tablet Take 1 tablet (81  mg total) by mouth daily.   carvedilol (COREG) 3.125 MG tablet Take 1 tablet (3.125 mg total) by mouth 2 (two) times daily.   Dapagliflozin Pro-metFORMIN ER (XIGDUO XR) 5-500 MG TB24 Take 1 tablet by mouth 2 (two) times daily with a meal.   hydrochlorothiazide (HYDRODIURIL) 25 MG tablet TAKE 1 TABLET EVERY DAY   losartan (COZAAR) 50 MG tablet TAKE 1 TABLET EVERY DAY   meloxicam (MOBIC) 15 MG tablet TAKE 1 TABLET EVERY DAY WITH FOOD AS NEEDED FOR LEG PAIN   omeprazole (PRILOSEC) 20 MG capsule TAKE 1 CAPSULE TWICE DAILY   pregabalin (LYRICA) 100 MG capsule 1 cap by mouth twice daily   rosuvastatin (CRESTOR) 20 MG tablet TAKE 1 TABLET EVERY EVENING WITH SUPPER   temazepam (RESTORIL) 22.5 MG capsule Take 1 capsule (22.5 mg total) by mouth at bedtime as needed for sleep.   traZODone (DESYREL) 150 MG tablet Take 1 tablet (150 mg total) by mouth at bedtime.   Allergies  Allergen Reactions   Advil [Ibuprofen] Nausea Only   Penicillins Itching    Did it involve swelling of the face/tongue/throat, SOB, or low BP? Unknown Did it involve sudden or severe rash/hives, skin peeling, or any reaction on the inside of your mouth or nose? Unknown Did you need to seek medical attention at a hospital or doctor's office? Unknown When did it last happen? Unknown If all  above answers are "NO", may proceed with cephalosporin use.   Past Medical History:  Diagnosis Date   Cellulitis 06/06/2013   Chest pain 06/07/2015   Chronic pain    Cocaine abuse (HCC) 09/09/2012   Cocaine positive on UDS 09/09/2012.     Diabetes mellitus    Elevated serum creatinine 08/14/2013   Fibromyalgia    Greater trochanteric bursitis of left hip 05/29/2014   Hyperlipidemia    Hypertension    Pneumonia due to COVID-19 virus 02/11/2019   Hospitalized with hypoxia, but did not require intubation/ventilation.   Schizophrenia (HCC)    Syncope 08/14/2013   Past Surgical History:  Procedure Laterality Date   OOPHORECTOMY      ectopic    Family History  Problem Relation Age of Onset   Hypertension Mother    Cancer Father        not clear on history   Cancer Sister        Lung   Cancer Maternal Aunt        colon cancer   Colon cancer Neg Hx    Colon polyps Neg Hx    Rectal cancer Neg Hx    Stomach cancer Neg Hx    Esophageal cancer Neg Hx    Social History   Socioeconomic History   Marital status: Married    Spouse name: Avaline Bian   Number of children: 3   Years of education: Not on file   Highest education level: 11th grade  Occupational History   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  Tobacco Use   Smoking status: Former    Current packs/day: 0.00    Average packs/day: 0.2 packs/day for 42.4 years (8.5 ttl pk-yrs)    Types: Cigarettes    Start date: 01/12/1973    Quit date: 06/13/2015    Years since quitting: 7.5   Smokeless tobacco: Never   Tobacco comments:    smokes about 3-4 cigs per day  as of  02/2014  Vaping Use   Vaping status: Never Used  Substance and Sexual Activity   Alcohol use: No    Alcohol/week: 0.0 standard drinks of alcohol   Drug use: Not Currently   Sexual activity: Not Currently  Other Topics Concern   Not on file  Social History Narrative   Lives with husband [name]).     Social Determinants of Health   Financial Resource Strain: Low Risk  (12/17/2022)   Overall Financial Resource Strain (CARDIA)    Difficulty of Paying Living Expenses: Not hard at all  Food Insecurity: No Food Insecurity (12/17/2022)   Hunger Vital Sign    Worried About Running Out of Food in the Last Year: Never true    Ran Out of Food in the Last Year: Never true  Transportation Needs: Unmet Transportation Needs (12/17/2022)   PRAPARE - Administrator, Civil Service (Medical): Yes    Lack of Transportation (Non-Medical): No  Physical Activity: Not on file  Stress: Not on file  Social Connections: Not on file  Intimate Partner Violence:  Not on file        Review of Systems    Objective:   BP 138/76 (BP Location: Left Arm, Patient Position: Sitting, Cuff Size: Normal)   Pulse 96   Resp 20   Ht 5' 1.5" (1.562 m)   Wt 192 lb (87.1 kg)   BMI 35.69 kg/m   Physical Exam Constitutional:  Appearance: She is obese.  HENT:     Head: Normocephalic and atraumatic.     Right Ear: Tympanic membrane, ear canal and external ear normal.     Left Ear: Tympanic membrane, ear canal and external ear normal.     Nose: Nose normal.     Mouth/Throat:     Mouth: Mucous membranes are moist.     Pharynx: Oropharynx is clear.  Eyes:     Extraocular Movements: Extraocular movements intact.     Conjunctiva/sclera: Conjunctivae normal.     Pupils: Pupils are equal, round, and reactive to light.     Comments: Discs sharp  Neck:     Thyroid: No thyroid mass or thyromegaly.  Cardiovascular:     Rate and Rhythm: Normal rate and regular rhythm.     Pulses:          Dorsalis pedis pulses are 2+ on the right side and 2+ on the left side.       Posterior tibial pulses are 2+ on the right side and 2+ on the left side.     Heart sounds: S1 normal and S2 normal. No murmur heard.    No S3 or S4 sounds.     Comments: No carotid bruits.  Carotid, radial, femoral, DP and PT pulses normal and equal.   Pulmonary:     Effort: Pulmonary effort is normal.     Breath sounds: Normal breath sounds and air entry.  Chest:     Comments: Patient declined Abdominal:     General: Bowel sounds are normal.     Palpations: Abdomen is soft. There is no hepatomegaly, splenomegaly or mass.     Tenderness: There is no abdominal tenderness.     Hernia: No hernia is present.  Genitourinary:    Comments: Patient declined Musculoskeletal:     Cervical back: Normal range of motion and neck supple.     Right lower leg: No edema.     Left lower leg: No edema.  Feet:     Right foot:     Protective Sensation: 10 sites tested.  10 sites sensed.     Skin  integrity: Callus and dry skin present.     Toenail Condition: Fungal disease present.    Left foot:     Protective Sensation: 10 sites tested.  8 sites sensed.     Skin integrity: Callus and dry skin present.     Toenail Condition: Fungal disease present. Lymphadenopathy:     Head:     Right side of head: No submental or submandibular adenopathy.     Left side of head: No submental or submandibular adenopathy.     Cervical: No cervical adenopathy.     Upper Body:     Right upper body: No supraclavicular adenopathy.     Left upper body: No supraclavicular adenopathy.  Skin:    Findings: No rash.  Neurological:     Mental Status: She is alert.     Cranial Nerves: Cranial nerves 2-12 are intact.     Sensory: Sensation is intact.     Motor: Motor function is intact.     Coordination: Coordination is intact.     Gait: Gait is intact.     Deep Tendon Reflexes: Reflexes are normal and symmetric.     Comments: Almost constant rocking back and forth with upper body and running hands across anterior thighs.    Psychiatric:        Mood and Affect: Affect is  labile.        Behavior: Behavior is cooperative.      Assessment & Plan   CPE without pap Mammogram in April.   DEXA Bone density and Vitamin D level. Encouraged Calcium citrate 500 mg twice daily Wants all meds sent to local [facility] and removed from mail order pharmacy CBC, CMP Recent colonoscopy, so no FIT this year. Encouraged obtaining COVID vaccine at pharmacy of choice.   2.  DM:  no interest in Ozempic as states her husband would not give injections.  A1C, urine microalbumin/crea  3. Hypertension:  Controlled  4.  Hyperlipidemia:  FLP  5.  Tinea pedis/toenail onychomycosis:  does not want an oral med.  Lamisil cream OTC twice daily to feet for 14 days.    6.  Tardive Dyskinesia:  pt. Refusing to take medication to control.  As per psych.

## 2022-12-17 NOTE — Patient Instructions (Signed)
Please get Spikevax/Moderna 2024-2025 COVID vaccine when at the pharmacy next time.

## 2022-12-19 LAB — CBC WITH DIFFERENTIAL/PLATELET
Basophils Absolute: 0.1 10*3/uL (ref 0.0–0.2)
Basos: 1 %
EOS (ABSOLUTE): 0.1 10*3/uL (ref 0.0–0.4)
Eos: 2 %
Hematocrit: 39.9 % (ref 34.0–46.6)
Hemoglobin: 12.7 g/dL (ref 11.1–15.9)
Immature Grans (Abs): 0 10*3/uL (ref 0.0–0.1)
Immature Granulocytes: 0 %
Lymphocytes Absolute: 3.4 10*3/uL — ABNORMAL HIGH (ref 0.7–3.1)
Lymphs: 44 %
MCH: 29.4 pg (ref 26.6–33.0)
MCHC: 31.8 g/dL (ref 31.5–35.7)
MCV: 92 fL (ref 79–97)
Monocytes Absolute: 0.5 10*3/uL (ref 0.1–0.9)
Monocytes: 7 %
Neutrophils Absolute: 3.7 10*3/uL (ref 1.4–7.0)
Neutrophils: 46 %
Platelets: 330 10*3/uL (ref 150–450)
RBC: 4.32 x10E6/uL (ref 3.77–5.28)
RDW: 14.7 % (ref 11.7–15.4)
WBC: 7.8 10*3/uL (ref 3.4–10.8)

## 2022-12-19 LAB — LIPID PANEL W/O CHOL/HDL RATIO
Cholesterol, Total: 149 mg/dL (ref 100–199)
HDL: 52 mg/dL (ref >39)
LDL Chol Calc (NIH): 68 mg/dL (ref 0–99)
Triglycerides: 172 mg/dL — ABNORMAL HIGH (ref 0–149)
VLDL Cholesterol Cal: 29 mg/dL (ref 5–40)

## 2022-12-19 LAB — HGB A1C W/O EAG: Hgb A1c MFr Bld: 6.1 % — ABNORMAL HIGH (ref 4.8–5.6)

## 2022-12-19 LAB — COMPREHENSIVE METABOLIC PANEL
ALT: 10 [IU]/L (ref 0–32)
AST: 18 [IU]/L (ref 0–40)
Albumin: 4.1 g/dL (ref 3.9–4.9)
Alkaline Phosphatase: 98 [IU]/L (ref 44–121)
BUN/Creatinine Ratio: 14 (ref 12–28)
BUN: 17 mg/dL (ref 8–27)
Bilirubin Total: <0.2 mg/dL (ref 0.0–1.2)
CO2: 23 mmol/L (ref 20–29)
Calcium: 9.2 mg/dL (ref 8.7–10.3)
Chloride: 104 mmol/L (ref 96–106)
Creatinine, Ser: 1.25 mg/dL — ABNORMAL HIGH (ref 0.57–1.00)
Globulin, Total: 2.6 g/dL (ref 1.5–4.5)
Glucose: 88 mg/dL (ref 70–99)
Potassium: 4.9 mmol/L (ref 3.5–5.2)
Sodium: 140 mmol/L (ref 134–144)
Total Protein: 6.7 g/dL (ref 6.0–8.5)
eGFR: 48 mL/min/{1.73_m2} — ABNORMAL LOW (ref >59)

## 2022-12-19 LAB — MICROALBUMIN / CREATININE URINE RATIO: Creatinine, Urine: 104.8 mg/dL

## 2022-12-19 LAB — VITAMIN D 25 HYDROXY (VIT D DEFICIENCY, FRACTURES): Vit D, 25-Hydroxy: 9.6 ng/mL — ABNORMAL LOW (ref 30.0–100.0)

## 2022-12-28 ENCOUNTER — Ambulatory Visit (INDEPENDENT_AMBULATORY_CARE_PROVIDER_SITE_OTHER): Payer: Medicare HMO | Admitting: Psychiatry

## 2022-12-28 ENCOUNTER — Encounter (HOSPITAL_COMMUNITY): Payer: Self-pay | Admitting: Psychiatry

## 2022-12-28 VITALS — BP 136/89 | HR 96 | Temp 98.4°F | Wt 190.2 lb

## 2022-12-28 DIAGNOSIS — F411 Generalized anxiety disorder: Secondary | ICD-10-CM

## 2022-12-28 DIAGNOSIS — F200 Paranoid schizophrenia: Secondary | ICD-10-CM | POA: Diagnosis not present

## 2022-12-28 DIAGNOSIS — G2401 Drug induced subacute dyskinesia: Secondary | ICD-10-CM

## 2022-12-28 MED ORDER — VALBENAZINE TOSYLATE 40 MG PO CAPS: 40.0000 mg | ORAL_CAPSULE | Freq: Every day | ORAL | 3 refills | Status: DC

## 2022-12-28 MED ORDER — TEMAZEPAM 22.5 MG PO CAPS: 22.5000 mg | ORAL_CAPSULE | Freq: Every evening | ORAL | Status: DC | PRN

## 2022-12-28 MED ORDER — HYDROXYZINE HCL 10 MG PO TABS: 10.0000 mg | ORAL_TABLET | Freq: Three times a day (TID) | ORAL | 3 refills | Status: DC | PRN

## 2022-12-28 MED ORDER — TRAZODONE HCL 150 MG PO TABS: 150.0000 mg | ORAL_TABLET | Freq: Every day | ORAL | 3 refills | Status: DC

## 2022-12-28 NOTE — Progress Notes (Signed)
BH MD/PA/NP OP Progress Note  12/28/2022 3:27 PM Kristine Atkinson  MRN:  [id number]  Chief Complaint: "I stopped Austedo"  HPI: 67 year old female seen today for follow up psychiatric evaluation. She has a psychiatric history of anxiety, depression, paranoid schizophrenia, and unspecified adult personality disorder. She is currently managed on Austedo 36 mg daily, Trazodone 150 mg nightly as needed, melatonin 5 mg, and Restoril 22.5 mg daily as needed.  She informed Clinical research associate that she discontinued Austedo and reports that her other doctor medications are somewhat effective in managing her psychiatric conditions.    Today she is well groomed, pleasant, cooperative, and engaged in conversation. Patient informed Clinical research associate that she stopped Austedo.  She also notes that she has been more irritable and anxious in the daytime and requested medication to help her manage her anxiety.  Patient informed Clinical research associate that she is uncertain of what is exacerbating her anxiety.  Provider conducted GAD-7 and patient scored a 12, at last visit she scored a 7.  Provider also conducted PHQ-9 and patient scored a 6, at her last visit she scored a 14.  She endorses adequate sleep and appetite.  Today she denies SI/HI/VAH, mania, paranoia.    Patient continues to suffer from symptoms of TD.  She notes that she discontinue a Austedo because she found it ineffective.  She informed Clinical research associate that she would like to trial Ingrezza again.  Patient had GI upset in the past when taking Ingrezza.  Provider conducted an aims assessment and patient scored a 14.    Today patient given a 2-week sample of Ingrezza 40 mg to help manage symptoms of TD.  Patient also agreeable to starting hydroxyzine 10 mg 3 times daily to help manage anxiety.  She will continue other medications as prescribed. Potential side effects of medication and risks vs benefits of treatment vs non-treatment were explained and discussed. All questions were answered.  No other  concerns noted at this time.  Visit Diagnosis:    ICD-10-CM   1. Tardive dyskinesia  G24.01     2. Schizophrenia, paranoid (HCC)  F20.0         Past Psychiatric History: anxiety, depression, paranoid schizophrenia, and unspecified adult personality disorder  Past Medical History:  Past Medical History:  Diagnosis Date   Cellulitis 06/06/2013   Chest pain 06/07/2015   Chronic pain    Cocaine abuse (HCC) 09/09/2012   Cocaine positive on UDS 09/09/2012.     Diabetes mellitus    Elevated serum creatinine 08/14/2013   Fibromyalgia    Greater trochanteric bursitis of left hip 05/29/2014   Hyperlipidemia    Hypertension    Pneumonia due to COVID-19 virus 02/11/2019   Hospitalized with hypoxia, but did not require intubation/ventilation.   Schizophrenia (HCC)    Syncope 08/14/2013    Past Surgical History:  Procedure Laterality Date   OOPHORECTOMY     ectopic    Family Psychiatric History: None reported   Family History:  Family History  Problem Relation Age of Onset   Hypertension Mother    Cancer Father        not clear on history   Cancer Sister        Lung   Cancer Maternal Aunt        colon cancer   Colon cancer Neg Hx    Colon polyps Neg Hx    Rectal cancer Neg Hx    Stomach cancer Neg Hx    Esophageal cancer Neg Hx  Social History:  Social History   Socioeconomic History   Marital status: Married    Spouse name: Takiesha Lapre   Number of children: 3   Years of education: Not on file   Highest education level: 11th grade  Occupational History   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  Tobacco Use   Smoking status: Former    Current packs/day: 0.00    Average packs/day: 0.2 packs/day for 42.4 years (8.5 ttl pk-yrs)    Types: Cigarettes    Start date: 01/12/1973    Quit date: 06/13/2015    Years since quitting: 7.5   Smokeless tobacco: Never   Tobacco comments:    smokes about 3-4 cigs per day  as of  02/2014  Vaping Use    Vaping status: Never Used  Substance and Sexual Activity   Alcohol use: No    Alcohol/week: 0.0 standard drinks of alcohol   Drug use: Not Currently   Sexual activity: Not Currently  Other Topics Concern   Not on file  Social History Narrative   Lives with husband Kamar Tolson).     Social Drivers of Corporate investment banker Strain: Low Risk  (12/17/2022)   Overall Financial Resource Strain (CARDIA)    Difficulty of Paying Living Expenses: Not hard at all  Food Insecurity: No Food Insecurity (12/17/2022)   Hunger Vital Sign    Worried About Running Out of Food in the Last Year: Never true    Ran Out of Food in the Last Year: Never true  Transportation Needs: Unmet Transportation Needs (12/17/2022)   PRAPARE - Administrator, Civil Service (Medical): Yes    Lack of Transportation (Non-Medical): No  Physical Activity: Not on file  Stress: Not on file  Social Connections: Not on file    Allergies:  Allergies  Allergen Reactions   Advil [Ibuprofen] Nausea Only   Penicillins Itching    Did it involve swelling of the face/tongue/throat, SOB, or low BP? Unknown Did it involve sudden or severe rash/hives, skin peeling, or any reaction on the inside of your mouth or nose? Unknown Did you need to seek medical attention at a hospital or doctor's office? Unknown When did it last happen? Unknown If all above answers are "NO", may proceed with cephalosporin use.    Metabolic Disorder Labs: Lab Results  Component Value Date   HGBA1C 6.1 (H) 12/17/2022   No results found for: "PROLACTIN" Lab Results  Component Value Date   CHOL 149 12/17/2022   TRIG 172 (H) 12/17/2022   HDL 52 12/17/2022   CHOLHDL 4.8 08/27/2014   VLDL 26 08/27/2014   LDLCALC 68 12/17/2022   LDLCALC 64 06/01/2022   Lab Results  Component Value Date   TSH 0.657 05/21/2021   TSH 0.667 03/02/2012    Therapeutic Level Labs: No results found for: "LITHIUM" No results found for:  "VALPROATE" No results found for: "CBMZ"  Current Medications: Current Outpatient Medications  Medication Sig Dispense Refill   AgaMatrix Ultra-Thin Lancets MISC Check blood glucose twice daily before meals (Patient not taking: Reported on 02/17/2022) 100 each 11   aspirin 81 MG tablet Take 1 tablet (81 mg total) by mouth daily. 100 tablet 1   Calcium Citrate 250 MG TABS 2 tabs by mouth twice daily 180 tablet 3   carvedilol (COREG) 3.125 MG tablet Take 1 tablet (3.125 mg total) by mouth 2 (two) times daily. 180 tablet 3   Dapagliflozin Pro-metFORMIN  ER (XIGDUO XR) 5-500 MG TB24 Take 1 tablet by mouth 2 (two) times daily with a meal. 180 tablet 3   Deutetrabenazine ER (AUSTEDO XR) 36 MG TB24 Take 36 mg by mouth daily in the afternoon. (Patient not taking: Reported on 12/17/2022) 30 tablet 3   glucose blood (AGAMATRIX PRESTO TEST) test strip Check blood glucose twice daily before meals (Patient not taking: Reported on 12/17/2022) 100 each 11   hydrochlorothiazide (HYDRODIURIL) 25 MG tablet Take 1 tablet (25 mg total) by mouth daily. 90 tablet 3   losartan (COZAAR) 50 MG tablet Take 1 tablet (50 mg total) by mouth daily. 90 tablet 3   melatonin 5 MG TABS Take 1 tablet (5 mg total) by mouth at bedtime. (Patient not taking: Reported on 12/17/2022) 30 tablet 3   meloxicam (MOBIC) 15 MG tablet TAKE 1 TABLET EVERY DAY WITH FOOD AS NEEDED FOR LEG PAIN 60 tablet 5   omeprazole (PRILOSEC) 20 MG capsule Take 1 capsule (20 mg total) by mouth 2 (two) times daily. 120 capsule 5   pregabalin (LYRICA) 100 MG capsule 1 cap by mouth twice daily 180 capsule 3   rosuvastatin (CRESTOR) 20 MG tablet TAKE 1 TABLET EVERY EVENING WITH SUPPER 90 tablet 3   temazepam (RESTORIL) 22.5 MG capsule Take 1 capsule (22.5 mg total) by mouth at bedtime as needed for sleep.     terbinafine (LAMISIL) 1 % cream Apply to feet twice daily for 14 days or until flakiness resolves 30 g 1   traZODone (DESYREL) 150 MG tablet Take 1 tablet (150  mg total) by mouth at bedtime. 30 tablet 3   No current facility-administered medications for this visit.     Musculoskeletal: Strength & Muscle Tone: within normal limits Gait & Station: normal Patient leans: N/A  Psychiatric Specialty Exam: Review of Systems  There were no vitals taken for this visit.There is no height or weight on file to calculate BMI.  General Appearance: Well Groomed  Eye Contact:  Good  Speech:  Slurred  Volume:  Normal  Mood:  Anxious  Affect:  Appropriate and Congruent  Thought Process:  Coherent, Goal Directed, and Linear  Orientation:  Full (Time, Place, and Person)  Thought Content: WDL and Logical   Suicidal Thoughts:  No  Homicidal Thoughts:  No  Memory:  Immediate;   Good Recent;   Good Remote;   Good  Judgement:  Good  Insight:  Good  Psychomotor Activity:  Normal  Concentration:  Concentration: Good and Attention Span: Good  Recall:  Good  Fund of Knowledge: Good  Language: Good  Akathisia:  No  Handed:  Right  AIMS (if indicated):  done,14  Assets:  Communication Skills Desire for Improvement Financial Resources/Insurance Housing Intimacy Leisure Time Physical Health Social Support  ADL's:  Intact  Cognition: WNL  Sleep:  Good   Screenings: AIMS    Flowsheet Row Clinical Support from 09/08/2022 in [facility] Office Visit from 06/09/2022 in [facility] Office Visit from 03/12/2022 in [facility] Office Visit from 12/18/2021 in [facility] Office Visit from 09/18/2021 in [facility]  AIMS Total Score 14 6 5 21 22       GAD-7    Flowsheet Row Clinical Support from 12/28/2022 in [facility] Clinical Support from 09/08/2022 in [facility] Office Visit from 06/09/2022 in [facility] Office Visit from  03/12/2022 in [facility] Office Visit from 12/18/2021 in [facility]  Total GAD-7 Score 12 7 6 13  0      PHQ2-9    Flowsheet Row Clinical Support from 12/28/2022 in [facility] Clinical Support from 09/08/2022 in [facility] Office Visit from 06/09/2022 in [facility] Office Visit from 03/12/2022 in [facility] Office Visit from 12/18/2021 in [facility]  PHQ-2 Total Score 0 3 0 4 0  PHQ-9 Total Score 6 14 0 13 --      Flowsheet Row Office Visit from 12/18/2021 in [facility] Office Visit from 09/18/2021 in [facility] Office Visit from 06/19/2021 in [facility]  C-SSRS RISK CATEGORY No Risk No Risk No Risk        Assessment and Plan: Patient continues to suffer from symptoms of TD.  She discontinued Austdo because she found it ineffective.  She notes that she would like to retry Ingrezza (in the past it caused GI upset).  Today patient given a 2-week sample of Ingrezza 40 mg to help manage symptoms of TD.  Patient also agreeable to starting hydroxyzine 10 mg 3 times daily to help manage anxiety.  She will continue other medications as prescribed.   1. Tardive dyskinesia   2. Schizophrenia, paranoid (HCC)  Continue- temazepam (RESTORIL) 22.5 MG capsule; Take 1 capsule (22.5 mg total) by mouth at bedtime as needed for sleep. Continue- traZODone (DESYREL) 150 MG tablet; Take 1 tablet (150 mg total) by mouth at bedtime.  Dispense: 30 tablet; Refill: 3  3. Anxiety state (Primary)  Start- hydrOXYzine (ATARAX) 10 MG tablet; Take 1 tablet (10 mg total) by mouth 3 (three) times daily as needed.  Dispense: 90 tablet; Refill: 3    Collaboration of Care: Collaboration of Care: Other provider involved in patient's  care AEB PCP  Patient/Guardian was advised Release of Information must be obtained prior to any record release in order to collaborate their care with an outside provider. Patient/Guardian was advised if they have not already done so to contact the registration department to sign all necessary forms in order for [place] to release information regarding their care.   Consent: Patient/Guardian gives verbal consent for treatment and assignment of benefits for services provided during this visit. Patient/Guardian expressed understanding and agreed to proceed.   Follow up in 2 months   Shanna Cisco, NP 12/28/2022, 3:27 PM

## 2022-12-31 ENCOUNTER — Telehealth (HOSPITAL_COMMUNITY): Payer: Self-pay | Admitting: *Deleted

## 2022-12-31 NOTE — Telephone Encounter (Signed)
Fax received for approval of Ingrezza 40mg  good until 01/12/2024. Called to notify pharmacy.

## 2022-12-31 NOTE — Telephone Encounter (Signed)
Fax received for prior authorization of Ingrezza. Submitted online with cover my meds. Awaiting decision.

## 2023-01-01 ENCOUNTER — Other Ambulatory Visit (HOSPITAL_COMMUNITY): Payer: Self-pay | Admitting: Psychiatry

## 2023-01-01 ENCOUNTER — Telehealth (HOSPITAL_COMMUNITY): Payer: Self-pay | Admitting: *Deleted

## 2023-01-01 DIAGNOSIS — F200 Paranoid schizophrenia: Secondary | ICD-10-CM

## 2023-01-01 MED ORDER — TEMAZEPAM 22.5 MG PO CAPS: 22.5000 mg | ORAL_CAPSULE | Freq: Every evening | ORAL | 1 refills | Status: DC | PRN

## 2023-01-01 NOTE — Telephone Encounter (Signed)
Fax received with cover my meds. Kristine Atkinson has been approved.

## 2023-01-15 ENCOUNTER — Telehealth (HOSPITAL_COMMUNITY): Payer: Self-pay | Admitting: Psychiatry

## 2023-01-18 ENCOUNTER — Other Ambulatory Visit (HOSPITAL_COMMUNITY): Payer: Self-pay | Admitting: Psychiatry

## 2023-01-18 DIAGNOSIS — F200 Paranoid schizophrenia: Secondary | ICD-10-CM

## 2023-01-18 MED ORDER — TEMAZEPAM 22.5 MG PO CAPS: 22.5000 mg | ORAL_CAPSULE | Freq: Every evening | ORAL | 1 refills | Status: DC | PRN

## 2023-01-18 NOTE — Telephone Encounter (Signed)
Medication reordered and sent to preferred pharmacy.

## 2023-01-18 NOTE — Telephone Encounter (Signed)
 Patient temazepam was sent on 01/01/2023.  Provider informed him that he could not be reported until 02/01/2023.  She endorsed understanding and agreed.

## 2023-02-09 ENCOUNTER — Other Ambulatory Visit: Payer: Self-pay | Admitting: Internal Medicine

## 2023-02-09 MED ORDER — VITAMIN D3 25 MCG (1000 UT) PO CAPS: 1000.0000 [IU] | ORAL_CAPSULE | Freq: Every day | ORAL | 11 refills | Status: DC

## 2023-02-11 ENCOUNTER — Other Ambulatory Visit: Payer: Self-pay

## 2023-02-11 MED ORDER — VITAMIN D3 25 MCG (1000 UT) PO CAPS: 1000.0000 [IU] | ORAL_CAPSULE | Freq: Every day | ORAL | 11 refills | Status: DC

## 2023-02-17 ENCOUNTER — Telehealth (HOSPITAL_COMMUNITY): Payer: Self-pay | Admitting: *Deleted

## 2023-02-17 ENCOUNTER — Other Ambulatory Visit (HOSPITAL_COMMUNITY): Payer: Self-pay | Admitting: Psychiatry

## 2023-02-17 DIAGNOSIS — F411 Generalized anxiety disorder: Secondary | ICD-10-CM

## 2023-02-17 DIAGNOSIS — F200 Paranoid schizophrenia: Secondary | ICD-10-CM

## 2023-02-17 DIAGNOSIS — F5105 Insomnia due to other mental disorder: Secondary | ICD-10-CM

## 2023-02-17 MED ORDER — HYDROXYZINE HCL 10 MG PO TABS: 10.0000 mg | ORAL_TABLET | Freq: Three times a day (TID) | ORAL | 3 refills | Status: DC | PRN

## 2023-02-17 MED ORDER — VALBENAZINE TOSYLATE 40 MG PO CAPS: 40.0000 mg | ORAL_CAPSULE | Freq: Every day | ORAL | 3 refills | Status: DC

## 2023-02-17 MED ORDER — TEMAZEPAM 22.5 MG PO CAPS: 22.5000 mg | ORAL_CAPSULE | Freq: Every evening | ORAL | 1 refills | Status: DC | PRN

## 2023-02-17 MED ORDER — TRAZODONE HCL 150 MG PO TABS: 150.0000 mg | ORAL_TABLET | Freq: Every day | ORAL | 3 refills | Status: DC

## 2023-02-17 NOTE — Telephone Encounter (Signed)
Patient called and states she needs her medications to [facility] to [facility] [address]. [phone]. She is unable to get to [facility] to get her medications because she changed her insurance to [place] and [facility] deals with [place].

## 2023-02-17 NOTE — Telephone Encounter (Signed)
 Medications sent to preferred pharmacy.

## 2023-02-17 NOTE — Telephone Encounter (Signed)
 Called [facility] specialty pharmacy at [phone] was told that PA was needed for Ingrezza . Called [phone] with [facility] who took clinical information over the phone and will fax decision within 48 hours. Case ID# [id number].

## 2023-02-18 ENCOUNTER — Telehealth (HOSPITAL_COMMUNITY): Payer: Self-pay | Admitting: *Deleted

## 2023-02-18 ENCOUNTER — Other Ambulatory Visit (HOSPITAL_COMMUNITY): Payer: Self-pay | Admitting: Psychiatry

## 2023-02-18 MED ORDER — VALBENAZINE TOSYLATE 40 MG PO CAPS: 40.0000 mg | ORAL_CAPSULE | Freq: Every day | ORAL | 3 refills | Status: DC

## 2023-02-18 NOTE — Telephone Encounter (Signed)
 Pt/pharmacy  is also requesting a refill of Ingrezza  40 mg

## 2023-02-18 NOTE — Telephone Encounter (Signed)
 Thank you for this update.  Medication sent to [facility] on [address]

## 2023-02-18 NOTE — Telephone Encounter (Signed)
 Fax received for approval of Ingrezza  from [employer] until 01/12/24. Called to notify pharmacy.

## 2023-03-11 ENCOUNTER — Other Ambulatory Visit: Payer: Self-pay | Admitting: Internal Medicine

## 2023-03-11 ENCOUNTER — Encounter (HOSPITAL_COMMUNITY): Payer: Self-pay | Admitting: Psychiatry

## 2023-03-11 ENCOUNTER — Ambulatory Visit (INDEPENDENT_AMBULATORY_CARE_PROVIDER_SITE_OTHER): Payer: Medicare HMO | Admitting: Psychiatry

## 2023-03-11 VITALS — BP 159/123 | HR 91 | Temp 98.2°F | Wt 195.2 lb

## 2023-03-11 DIAGNOSIS — G2401 Drug induced subacute dyskinesia: Secondary | ICD-10-CM

## 2023-03-11 DIAGNOSIS — F200 Paranoid schizophrenia: Secondary | ICD-10-CM | POA: Diagnosis not present

## 2023-03-11 DIAGNOSIS — F411 Generalized anxiety disorder: Secondary | ICD-10-CM | POA: Diagnosis not present

## 2023-03-11 DIAGNOSIS — Z1231 Encounter for screening mammogram for malignant neoplasm of breast: Secondary | ICD-10-CM

## 2023-03-11 MED ORDER — HYDROXYZINE HCL 10 MG PO TABS: 10.0000 mg | ORAL_TABLET | Freq: Three times a day (TID) | ORAL | 3 refills | Status: DC | PRN

## 2023-03-11 MED ORDER — VALBENAZINE TOSYLATE 60 MG PO CAPS: 60.0000 mg | ORAL_CAPSULE | Freq: Every day | ORAL | 3 refills | Status: DC

## 2023-03-11 MED ORDER — TRAZODONE HCL 100 MG PO TABS: 200.0000 mg | ORAL_TABLET | Freq: Every day | ORAL | 3 refills | Status: DC

## 2023-03-11 NOTE — Progress Notes (Signed)
 [facility] MD/PA/NP OP Progress Note  03/11/2023 2:17 PM Kristine Atkinson  MRN:  [id number]  Chief Complaint: "I can't sleep"  HPI: 68 year old female seen today for follow up psychiatric evaluation. She has a psychiatric history of anxiety, depression, paranoid schizophrenia, and unspecified adult personality disorder. She is currently managed on Ingrezza 40 mg daily, Trazodone 150 mg nightly as needed, melatonin 5 mg, and Restoril 22.5 mg daily as needed.  She informed Clinical research associate that her  medications are somewhat effective in managing her psychiatric conditions.    Today she is well groomed, pleasant, cooperative, and engaged in conversation. Patient informed Clinical research associate that she continues to have poor sleep.  She informed Clinical research associate that she sleeps approximately 4 hours nightly. She no longer takes melatonin. Patient notes that since restarting Ingrezza for his symptoms of TD he has improved.  She notes that Ingrezza no longer causes GI upset.  Provider conducted an aims assessment and patient scored an 8, at her last visit she scored a 14.  Patient notes that her anxiety and depression are well-managed.  Today a GAD-7 and PHQ-9 not conducted.  She denies SI/HI/VH, mania, or paranoia.    Patient's blood pressure elevated today at 159/123.  She informed her she did not take her blood pressure medication but is followed by her PCP for this.  Patient O2 sats slightly low today reading 90 at its lowest at 95 at the highest.  Patient does inform writer that she no longer smokes tobacco.  Today patient agreeable to increasing trazodone 150 mg to 200 mg to help manage sleep.  Patient informed that Restoril would be reduced to 15 mg when it is due for refill.  She endorsed understanding and agreed.  Ingrezza 40 mg increased to 60 mg to help manage symptoms of TD. At this time Melatonin not reordered. She will continue other medications as prescribed.  No other concerns noted at this time.  Visit Diagnosis:    ICD-10-CM   1.  Schizophrenia, paranoid, in remission (HCC)  F20.0     2. Anxiety state  F41.1 hydrOXYzine (ATARAX) 10 MG tablet    traZODone (DESYREL) 100 MG tablet    3. Tardive dyskinesia  G24.01 valbenazine (INGREZZA) 60 MG capsule         Past Psychiatric History: anxiety, depression, paranoid schizophrenia, and unspecified adult personality disorder  Past Medical History:  Past Medical History:  Diagnosis Date   Cellulitis 06/06/2013   Chest pain 06/07/2015   Chronic pain    Cocaine abuse (HCC) 09/09/2012   Cocaine positive on UDS 09/09/2012.     Diabetes mellitus    Elevated serum creatinine 08/14/2013   Fibromyalgia    Greater trochanteric bursitis of left hip 05/29/2014   Hyperlipidemia    Hypertension    Pneumonia due to COVID-19 virus 02/11/2019   Hospitalized with hypoxia, but did not require intubation/ventilation.   Schizophrenia (HCC)    Syncope 08/14/2013    Past Surgical History:  Procedure Laterality Date   OOPHORECTOMY     ectopic    Family Psychiatric History: None reported   Family History:  Family History  Problem Relation Age of Onset   Hypertension Mother    Cancer Father        not clear on history   Cancer Sister        Lung   Cancer Maternal Aunt        colon cancer   Colon cancer Neg Hx    Colon polyps Neg  Hx    Rectal cancer Neg Hx    Stomach cancer Neg Hx    Esophageal cancer Neg Hx     Social History:  Social History   Socioeconomic History   Marital status: Married    Spouse name: Kristine Atkinson   Number of children: 3   Years of education: Not on file   Highest education level: 11th grade  Occupational History   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  Tobacco Use   Smoking status: Former    Current packs/day: 0.00    Average packs/day: 0.2 packs/day for 42.4 years (8.5 ttl pk-yrs)    Types: Cigarettes    Start date: 01/12/1973    Quit date: 06/13/2015    Years since quitting: 7.7   Smokeless tobacco: Never    Tobacco comments:    smokes about 3-4 cigs per day  as of  02/2014  Vaping Use   Vaping status: Never Used  Substance and Sexual Activity   Alcohol use: No    Alcohol/week: 0.0 standard drinks of alcohol   Drug use: Not Currently   Sexual activity: Not Currently  Other Topics Concern   Not on file  Social History Narrative   Lives with husband Kristine Atkinson).     Social Drivers of Corporate investment banker Strain: Low Risk  (12/17/2022)   Overall Financial Resource Strain (CARDIA)    Difficulty of Paying Living Expenses: Not hard at all  Food Insecurity: No Food Insecurity (12/17/2022)   Hunger Vital Sign    Worried About Running Out of Food in the Last Year: Never true    Ran Out of Food in the Last Year: Never true  Transportation Needs: Unmet Transportation Needs (12/17/2022)   PRAPARE - Administrator, Civil Service (Medical): Yes    Lack of Transportation (Non-Medical): No  Physical Activity: Not on file  Stress: Not on file  Social Connections: Not on file    Allergies:  Allergies  Allergen Reactions   Advil [Ibuprofen] Nausea Only   Penicillins Itching    Did it involve swelling of the face/tongue/throat, SOB, or low BP? Unknown Did it involve sudden or severe rash/hives, skin peeling, or any reaction on the inside of your mouth or nose? Unknown Did you need to seek medical attention at a hospital or doctor's office? Unknown When did it last happen? Unknown If all above answers are "NO", may proceed with cephalosporin use.    Metabolic Disorder Labs: Lab Results  Component Value Date   HGBA1C 6.1 (H) 12/17/2022   No results found for: "PROLACTIN" Lab Results  Component Value Date   CHOL 149 12/17/2022   TRIG 172 (H) 12/17/2022   HDL 52 12/17/2022   CHOLHDL 4.8 08/27/2014   VLDL 26 08/27/2014   LDLCALC 68 12/17/2022   LDLCALC 64 06/01/2022   Lab Results  Component Value Date   TSH 0.657 05/21/2021   TSH 0.667 03/02/2012     Therapeutic Level Labs: No results found for: "LITHIUM" No results found for: "VALPROATE" No results found for: "CBMZ"  Current Medications: Current Outpatient Medications  Medication Sig Dispense Refill   AgaMatrix Ultra-Thin Lancets MISC Check blood glucose twice daily before meals (Patient not taking: Reported on 02/17/2022) 100 each 11   aspirin 81 MG tablet Take 1 tablet (81 mg total) by mouth daily. 100 tablet 1   Calcium Citrate 250 MG TABS 2 tabs by mouth twice daily 180 tablet 3  carvedilol (COREG) 3.125 MG tablet Take 1 tablet (3.125 mg total) by mouth 2 (two) times daily. 180 tablet 3   Cholecalciferol (VITAMIN D3) 25 MCG (1000 UT) CAPS Take 1 capsule (1,000 Units total) by mouth daily. 30 capsule 11   Dapagliflozin Pro-metFORMIN ER (XIGDUO XR) 5-500 MG TB24 Take 1 tablet by mouth 2 (two) times daily with a meal. 180 tablet 3   glucose blood (AGAMATRIX PRESTO TEST) test strip Check blood glucose twice daily before meals (Patient not taking: Reported on 12/17/2022) 100 each 11   hydrochlorothiazide (HYDRODIURIL) 25 MG tablet Take 1 tablet (25 mg total) by mouth daily. 90 tablet 3   hydrOXYzine (ATARAX) 10 MG tablet Take 1 tablet (10 mg total) by mouth 3 (three) times daily as needed. 90 tablet 3   losartan (COZAAR) 50 MG tablet Take 1 tablet (50 mg total) by mouth daily. 90 tablet 3   meloxicam (MOBIC) 15 MG tablet TAKE 1 TABLET EVERY DAY WITH FOOD AS NEEDED FOR LEG PAIN 60 tablet 5   omeprazole (PRILOSEC) 20 MG capsule Take 1 capsule (20 mg total) by mouth 2 (two) times daily. 120 capsule 5   pregabalin (LYRICA) 100 MG capsule 1 cap by mouth twice daily 180 capsule 3   rosuvastatin (CRESTOR) 20 MG tablet TAKE 1 TABLET EVERY EVENING WITH SUPPER 90 tablet 3   temazepam (RESTORIL) 22.5 MG capsule Take 1 capsule (22.5 mg total) by mouth at bedtime as needed for sleep. 30 capsule 1   terbinafine (LAMISIL) 1 % cream Apply to feet twice daily for 14 days or until flakiness resolves 30  g 1   traZODone (DESYREL) 100 MG tablet Take 2 tablets (200 mg total) by mouth at bedtime. 60 tablet 3   valbenazine (INGREZZA) 60 MG capsule Take 1 capsule (60 mg total) by mouth daily. 30 capsule 3   No current facility-administered medications for this visit.     Musculoskeletal: Strength & Muscle Tone: within normal limits Gait & Station: normal Patient leans: N/A  Psychiatric Specialty Exam: Review of Systems  There were no vitals taken for this visit.There is no height or weight on file to calculate BMI.  General Appearance: Well Groomed  Eye Contact:  Good  Speech:  Slurred  Volume:  Normal  Mood:  Anxious  Affect:  Appropriate and Congruent  Thought Process:  Coherent, Goal Directed, and Linear  Orientation:  Full (Time, Place, and Person)  Thought Content: WDL and Logical   Suicidal Thoughts:  No  Homicidal Thoughts:  No  Memory:  Immediate;   Good Recent;   Good Remote;   Good  Judgement:  Good  Insight:  Good  Psychomotor Activity:  Normal  Concentration:  Concentration: Good and Attention Span: Good  Recall:  Good  Fund of Knowledge: Good  Language: Good  Akathisia:  No  Handed:  Right  AIMS (if indicated):  done,8  Assets:  Communication Skills Desire for Improvement Financial Resources/Insurance Housing Intimacy Leisure Time Physical Health Social Support  ADL's:  Intact  Cognition: WNL  Sleep:  Fair   Screenings: AIMS    Flowsheet Row Clinical Support from 03/11/2023 in [facility] Clinical Support from 12/28/2022 in [facility] Clinical Support from 09/08/2022 in [facility] Office Visit from 06/09/2022 in [facility] Office Visit from 03/12/2022 in [facility]  AIMS Total Score 8 14 14 6 5       GAD-7  Flowsheet Row Clinical Support from 12/28/2022 in [facility]  Clinical Support from 09/08/2022 in [facility] Office Visit from 06/09/2022 in [facility] Office Visit from 03/12/2022 in [facility] Office Visit from 12/18/2021 in [facility]  Total GAD-7 Score 12 7 6 13  0      PHQ2-9    Flowsheet Row Clinical Support from 12/28/2022 in [facility] Clinical Support from 09/08/2022 in [facility] Office Visit from 06/09/2022 in [facility] Office Visit from 03/12/2022 in [facility] Office Visit from 12/18/2021 in [facility]  PHQ-2 Total Score 0 3 0 4 0  PHQ-9 Total Score 6 14 0 13 --      Flowsheet Row Office Visit from 12/18/2021 in [facility] Office Visit from 09/18/2021 in [facility] Office Visit from 06/19/2021 in [facility]  C-SSRS RISK CATEGORY No Risk No Risk No Risk        Assessment and Plan: Patient continues to suffer from symptoms of TD but reports that its improving. She also notes that her sleep has been poor.  Today patient agreeable to increasing trazodone 150 mg to 200 mg to help manage sleep.  Patient informed that Restoril would be reduced to 15 mg when it is due for refill.  She endorsed understanding and agreed.  Ingrezza 40 mg increased to 60 mg to help manage symptoms of TD. At this time Melatonin not reordered  1. Anxiety state  Continue- hydrOXYzine (ATARAX) 10 MG tablet; Take 1 tablet (10 mg total) by mouth 3 (three) times daily as needed.  Dispense: 90 tablet; Refill: 3 Increased- traZODone (DESYREL) 100 MG tablet; Take 2 tablets (200 mg total) by mouth at bedtime.  Dispense: 60 tablet; Refill: 3  2. Schizophrenia, paranoid, in remission (HCC) (Primary)   3. Tardive  dyskinesia  Increased- valbenazine (INGREZZA) 60 MG capsule; Take 1 capsule (60 mg total) by mouth daily.  Dispense: 30 capsule; Refill: 3   Collaboration of Care: Collaboration of Care: Other provider involved in patient's care AEB PCP  Patient/Guardian was advised Release of Information must be obtained prior to any record release in order to collaborate their care with an outside provider. Patient/Guardian was advised if they have not already done so to contact the registration department to sign all necessary forms in order for [place] to release information regarding their care.   Consent: Patient/Guardian gives verbal consent for treatment and assignment of benefits for services provided during this visit. Patient/Guardian expressed understanding and agreed to proceed.   Follow up in 3 months   Shanna Cisco, NP 03/11/2023, 2:17 PM

## 2023-03-17 ENCOUNTER — Encounter (HOSPITAL_COMMUNITY): Payer: Self-pay | Admitting: *Deleted

## 2023-03-17 ENCOUNTER — Telehealth (HOSPITAL_COMMUNITY): Payer: Self-pay | Admitting: *Deleted

## 2023-03-17 NOTE — Telephone Encounter (Signed)
 Fax received for PA approval of Hydroxyzine from 01/13/23-01/12/2024. Called to notify pharmacy.

## 2023-03-17 NOTE — Telephone Encounter (Signed)
 Fax received for PA of Hydroxyzine 10mg . Submitted online with cover my meds. Awaiting decision.

## 2023-03-22 ENCOUNTER — Other Ambulatory Visit: Payer: Self-pay

## 2023-03-22 MED ORDER — LOSARTAN POTASSIUM 50 MG PO TABS: 50.0000 mg | ORAL_TABLET | Freq: Every day | ORAL | 3 refills | Status: AC

## 2023-03-22 MED ORDER — PREGABALIN 100 MG PO CAPS: ORAL_CAPSULE | ORAL | 3 refills | Status: DC

## 2023-03-22 MED ORDER — CALCIUM CITRATE 250 MG PO TABS: ORAL_TABLET | ORAL | 3 refills | Status: AC

## 2023-03-22 MED ORDER — OMEPRAZOLE 20 MG PO CPDR: 20.0000 mg | DELAYED_RELEASE_CAPSULE | Freq: Two times a day (BID) | ORAL | 5 refills | Status: AC

## 2023-03-22 MED ORDER — HYDROCHLOROTHIAZIDE 25 MG PO TABS: 25.0000 mg | ORAL_TABLET | Freq: Every day | ORAL | 3 refills | Status: AC

## 2023-03-22 MED ORDER — VITAMIN D3 25 MCG (1000 UT) PO CAPS: 1000.0000 [IU] | ORAL_CAPSULE | Freq: Every day | ORAL | 11 refills | Status: AC

## 2023-03-22 MED ORDER — ROSUVASTATIN CALCIUM 20 MG PO TABS: ORAL_TABLET | ORAL | 3 refills | Status: AC

## 2023-03-22 MED ORDER — MELOXICAM 15 MG PO TABS: ORAL_TABLET | ORAL | 5 refills | Status: AC

## 2023-03-22 MED ORDER — CARVEDILOL 3.125 MG PO TABS: 3.1250 mg | ORAL_TABLET | Freq: Two times a day (BID) | ORAL | 3 refills | Status: AC

## 2023-03-22 MED ORDER — XIGDUO XR 5-500 MG PO TB24: 1.0000 | ORAL_TABLET | Freq: Two times a day (BID) | ORAL | 3 refills | Status: AC

## 2023-04-20 ENCOUNTER — Ambulatory Visit: Payer: Self-pay

## 2023-04-23 ENCOUNTER — Ambulatory Visit
Admission: RE | Admit: 2023-04-23 | Discharge: 2023-04-23 | Disposition: A | Discharge status: Home / Self Care | Source: Ambulatory Visit | Attending: Internal Medicine | Admitting: Internal Medicine

## 2023-04-23 DIAGNOSIS — Z1231 Encounter for screening mammogram for malignant neoplasm of breast: Secondary | ICD-10-CM

## 2023-05-11 ENCOUNTER — Other Ambulatory Visit (INDEPENDENT_AMBULATORY_CARE_PROVIDER_SITE_OTHER): Payer: Medicare HMO

## 2023-05-11 DIAGNOSIS — R7989 Other specified abnormal findings of blood chemistry: Secondary | ICD-10-CM

## 2023-05-12 LAB — VITAMIN D 25 HYDROXY (VIT D DEFICIENCY, FRACTURES): Vit D, 25-Hydroxy: 31.5 ng/mL (ref 30.0–100.0)

## 2023-06-03 ENCOUNTER — Encounter (HOSPITAL_COMMUNITY): Payer: Self-pay | Admitting: Psychiatry

## 2023-06-03 ENCOUNTER — Ambulatory Visit (INDEPENDENT_AMBULATORY_CARE_PROVIDER_SITE_OTHER): Payer: Medicare HMO | Admitting: Psychiatry

## 2023-06-03 DIAGNOSIS — F411 Generalized anxiety disorder: Secondary | ICD-10-CM | POA: Diagnosis not present

## 2023-06-03 DIAGNOSIS — G2401 Drug induced subacute dyskinesia: Secondary | ICD-10-CM

## 2023-06-03 DIAGNOSIS — F200 Paranoid schizophrenia: Secondary | ICD-10-CM

## 2023-06-03 MED ORDER — TEMAZEPAM 15 MG PO CAPS: 15.0000 mg | ORAL_CAPSULE | Freq: Every evening | ORAL | 2 refills | Status: DC | PRN

## 2023-06-03 MED ORDER — TRAZODONE HCL 100 MG PO TABS: 200.0000 mg | ORAL_TABLET | Freq: Every day | ORAL | 3 refills | Status: DC

## 2023-06-03 MED ORDER — VALBENAZINE TOSYLATE 60 MG PO CAPS: 60.0000 mg | ORAL_CAPSULE | Freq: Every day | ORAL | 3 refills | Status: DC

## 2023-06-03 MED ORDER — HYDROXYZINE HCL 25 MG PO TABS: 25.0000 mg | ORAL_TABLET | Freq: Three times a day (TID) | ORAL | 3 refills | Status: DC | PRN

## 2023-06-03 NOTE — Progress Notes (Signed)
 [facility] MD/PA/NP OP Progress Note  06/03/2023 2:06 PM Kristine Atkinson  MRN:  [id number]  Chief Complaint: "I am in pain"  HPI: 68 year old female seen today for follow up psychiatric evaluation. She has a psychiatric history of anxiety, depression, paranoid schizophrenia, and unspecified adult personality disorder. She is currently managed on Ingrezza  60 mg daily, Trazodone  200 mg nightly as needed, and Restoril  22.5 mg daily as needed.  She informed Clinical research associate that her  medications are somewhat effective in managing her psychiatric conditions.    Today she is well groomed, agitated, restless, and disengaged in conversation. Patient informed Clinical research associate that she today she is in a lot of pain.  She quantifies her knee pain as 10 out of 10.  She also informed writer that she does not want to sit and do a long assessment.  Provider informed patient that she would have to speak to her if medications are going to be adjusted.  She endorsed understanding and agreed.  Patient notes that she has not had her trazodone  or Ingrezza .  She notes that she has been without sleep and feels overly anxious.  Patient unwilling to do a GAD-7 or PHQ-9.    Patient notes that she takes her Ingrezza  periodically.  Provider conducted an aims assessment today and patient scored a 9, her last visit she scored an 8.  Provider informed patient that in order for symptoms to improve she must take it regularly.  She endorsed understanding and hesitantly agreed.  Today she denies SI/HI/VAH, mania, or paranoia.    Patient increasingly fidgety during exam.  She had poor concentration.  Provider asked patient if she was utilizing alcohol or illegal drugs.  She notes that she was not.  Patient's blood pressure elevated today at 158/124.  She has an appointment on 06/14/2023 to address her physical health.  Patient blood pressure might have also been elevated due to pain.   At patient's last visit provider informed her that her Restoril  would be  reduced.  Today provider reduced Restoril  22.5 mg to 15 mg.  Hydroxyzine  increased from 10 mg 3 times daily to 25 mg 3 times daily to help manage anxiety.  She will continue all other medications as prescribed.  Patient will follow-up with her PCP once 06/14/2023 to address physical ailments.  No other concerns noted at this time.  Visit Diagnosis:    ICD-10-CM   1. Tardive dyskinesia  G24.01 valbenazine  (INGREZZA ) 60 MG capsule    2. Schizophrenia, paranoid (HCC)  F20.0 temazepam  (RESTORIL ) 15 MG capsule    3. Anxiety state  F41.1 traZODone  (DESYREL ) 100 MG tablet    hydrOXYzine  (ATARAX ) 25 MG tablet          Past Psychiatric History: anxiety, depression, paranoid schizophrenia, and unspecified adult personality disorder  Past Medical History:  Past Medical History:  Diagnosis Date   Cellulitis 06/06/2013   Chest pain 06/07/2015   Chronic pain    Cocaine  abuse (HCC) 09/09/2012   Cocaine  positive on UDS 09/09/2012.     Diabetes mellitus    Elevated serum creatinine 08/14/2013   Fibromyalgia    Greater trochanteric bursitis of left hip 05/29/2014   Hyperlipidemia    Hypertension    Pneumonia due to COVID-19 virus 02/11/2019   Hospitalized with hypoxia, but did not require intubation/ventilation.   Schizophrenia (HCC)    Syncope 08/14/2013    Past Surgical History:  Procedure Laterality Date   OOPHORECTOMY     ectopic    Family Psychiatric  History: None reported   Family History:  Family History  Problem Relation Age of Onset   Hypertension Mother    Cancer Father        not clear on history   Cancer Sister        Lung   Cancer Maternal Aunt        colon cancer   Breast cancer Niece    Colon cancer Neg Hx    Colon polyps Neg Hx    Rectal cancer Neg Hx    Stomach cancer Neg Hx    Esophageal cancer Neg Hx     Social History:  Social History   Socioeconomic History   Marital status: Married    Spouse name: Oyinkansola Truax   Number of children: 3   Years of  education: Not on file   Highest education level: 11th grade  Occupational History   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  Tobacco Use   Smoking status: Former    Current packs/day: 0.00    Average packs/day: 0.2 packs/day for 42.4 years (8.5 ttl pk-yrs)    Types: Cigarettes    Start date: 01/12/1973    Quit date: 06/13/2015    Years since quitting: 7.9   Smokeless tobacco: Never   Tobacco comments:    smokes about 3-4 cigs per day  as of  02/2014  Vaping Use   Vaping status: Never Used  Substance and Sexual Activity   Alcohol use: No    Alcohol/week: 0.0 standard drinks of alcohol   Drug use: Not Currently   Sexual activity: Not Currently  Other Topics Concern   Not on file  Social History Narrative   Lives with husband Louiza Moor).     Social Drivers of Corporate investment banker Strain: Low Risk  (12/17/2022)   Overall Financial Resource Strain (CARDIA)    Difficulty of Paying Living Expenses: Not hard at all  Food Insecurity: No Food Insecurity (12/17/2022)   Hunger Vital Sign    Worried About Running Out of Food in the Last Year: Never true    Ran Out of Food in the Last Year: Never true  Transportation Needs: Unmet Transportation Needs (12/17/2022)   PRAPARE - Administrator, Civil Service (Medical): Yes    Lack of Transportation (Non-Medical): No  Physical Activity: Not on file  Stress: Not on file  Social Connections: Not on file    Allergies:  Allergies  Allergen Reactions   Advil  [Ibuprofen ] Nausea Only   Penicillins Itching    Did it involve swelling of the face/tongue/throat, SOB, or low BP? Unknown Did it involve sudden or severe rash/hives, skin peeling, or any reaction on the inside of your mouth or nose? Unknown Did you need to seek medical attention at a hospital or doctor's office? Unknown When did it last happen? Unknown If all above answers are "NO", may proceed with cephalosporin use.    Metabolic  Disorder Labs: Lab Results  Component Value Date   HGBA1C 6.1 (H) 12/17/2022   No results found for: "PROLACTIN" Lab Results  Component Value Date   CHOL 149 12/17/2022   TRIG 172 (H) 12/17/2022   HDL 52 12/17/2022   CHOLHDL 4.8 08/27/2014   VLDL 26 08/27/2014   LDLCALC 68 12/17/2022   LDLCALC 64 06/01/2022   Lab Results  Component Value Date   TSH 0.657 05/21/2021   TSH 0.667 03/02/2012    Therapeutic Level Labs: No results  found for: "LITHIUM" No results found for: "VALPROATE" No results found for: "CBMZ"  Current Medications: Current Outpatient Medications  Medication Sig Dispense Refill   AgaMatrix Ultra-Thin Lancets MISC Check blood glucose twice daily before meals (Patient not taking: Reported on 02/17/2022) 100 each 11   aspirin  81 MG tablet Take 1 tablet (81 mg total) by mouth daily. 100 tablet 1   Calcium  Citrate 250 MG TABS 2 tabs by mouth twice daily 180 tablet 3   carvedilol  (COREG ) 3.125 MG tablet Take 1 tablet (3.125 mg total) by mouth 2 (two) times daily. 180 tablet 3   Cholecalciferol (VITAMIN D3) 25 MCG (1000 UT) CAPS Take 1 capsule (1,000 Units total) by mouth daily. 30 capsule 11   Dapagliflozin  Pro-metFORMIN  ER (XIGDUO  XR) 5-500 MG TB24 Take 1 tablet by mouth 2 (two) times daily with a meal. 180 tablet 3   glucose blood (AGAMATRIX PRESTO TEST) test strip Check blood glucose twice daily before meals (Patient not taking: Reported on 12/17/2022) 100 each 11   hydrochlorothiazide  (HYDRODIURIL ) 25 MG tablet Take 1 tablet (25 mg total) by mouth daily. 90 tablet 3   hydrOXYzine  (ATARAX ) 25 MG tablet Take 1 tablet (25 mg total) by mouth 3 (three) times daily as needed. 90 tablet 3   losartan  (COZAAR ) 50 MG tablet Take 1 tablet (50 mg total) by mouth daily. 90 tablet 3   meloxicam  (MOBIC ) 15 MG tablet TAKE 1 TABLET EVERY DAY WITH FOOD AS NEEDED FOR LEG PAIN 60 tablet 5   omeprazole  (PRILOSEC) 20 MG capsule Take 1 capsule (20 mg total) by mouth 2 (two) times daily. 120  capsule 5   pregabalin  (LYRICA ) 100 MG capsule 1 cap by mouth twice daily 180 capsule 3   rosuvastatin  (CRESTOR ) 20 MG tablet TAKE 1 TABLET EVERY EVENING WITH SUPPER 90 tablet 3   temazepam  (RESTORIL ) 15 MG capsule Take 1 capsule (15 mg total) by mouth at bedtime as needed for sleep. 30 capsule 2   terbinafine  (LAMISIL ) 1 % cream Apply to feet twice daily for 14 days or until flakiness resolves 30 g 1   traZODone  (DESYREL ) 100 MG tablet Take 2 tablets (200 mg total) by mouth at bedtime. 60 tablet 3   valbenazine  (INGREZZA ) 60 MG capsule Take 1 capsule (60 mg total) by mouth daily. 30 capsule 3   No current facility-administered medications for this visit.     Musculoskeletal: Strength & Muscle Tone: within normal limits Gait & Station: normal Patient leans: N/A  Psychiatric Specialty Exam: Review of Systems  There were no vitals taken for this visit.There is no height or weight on file to calculate BMI.  General Appearance: Well Groomed  Eye Contact:  Good  Speech:  Slurred  Volume:  Normal  Mood:  Anxious and Depressed  Affect:  Blunt and Inappropriate  Thought Process:  Coherent, Goal Directed, and Linear  Orientation:  Full (Time, Place, and Person)  Thought Content: WDL and Logical   Suicidal Thoughts:  No  Homicidal Thoughts:  No  Memory:  Immediate;   Good Recent;   Good Remote;   Good  Judgement:  Good  Insight:  Good  Psychomotor Activity:  Normal  Concentration:  Concentration: Good and Attention Span: Good  Recall:  Good  Fund of Knowledge: Good  Language: Good  Akathisia:  No  Handed:  Right  AIMS (if indicated):  done, 9  Assets:  Communication Skills Desire for Improvement Financial Resources/Insurance Housing Intimacy Leisure Time Physical Health Social Support  ADL's:  Intact  Cognition: WNL  Sleep:  Fair   Screenings: AIMS    Flowsheet Row Clinical Support from 06/03/2023 in [facility] Clinical Support from  03/11/2023 in [facility] Clinical Support from 12/28/2022 in [facility] Clinical Support from 09/08/2022 in [facility] Office Visit from 06/09/2022 in [facility]  AIMS Total Score 9 8 14 14 6       GAD-7    Flowsheet Row Clinical Support from 12/28/2022 in [facility] Clinical Support from 09/08/2022 in [facility] Office Visit from 06/09/2022 in [facility] Office Visit from 03/12/2022 in [facility] Office Visit from 12/18/2021 in [facility]  Total GAD-7 Score 12 7 6 13  0      PHQ2-9    Flowsheet Row Clinical Support from 12/28/2022 in [facility] Clinical Support from 09/08/2022 in [facility] Office Visit from 06/09/2022 in [facility] Office Visit from 03/12/2022 in [facility] Office Visit from 12/18/2021 in [facility]  PHQ-2 Total Score 0 3 0 4 0  PHQ-9 Total Score 6 14 0 13 --      Flowsheet Row Office Visit from 12/18/2021 in [facility] Office Visit from 09/18/2021 in [facility] Office Visit from 06/19/2021 in [facility]  C-SSRS RISK CATEGORY No Risk No Risk No Risk        Assessment and Plan: Patient continues to suffer from symptoms of TD but reports that its she has not been taking her medications.  Patient informed Clinical research associate that she was in a lot of pain.  She was visiting and restless throughout the exam.  She denies alcohol or illegal drug use.  At patient's last visit provider informed her that her Restoril  would be reduced.  Today provider reduced Restoril  22.5 mg to 15 mg.  Hydroxyzine  increased  from 10 mg 3 times daily to 25 mg 3 times daily to help manage anxiety.  She will continue all other medications as prescribed.  Patient will follow-up with her PCP once 06/14/2023 to address physical ailments.    1. Tardive dyskinesia  Continue- valbenazine  (INGREZZA ) 60 MG capsule; Take 1 capsule (60 mg total) by mouth daily.  Dispense: 30 capsule; Refill: 3  2. Schizophrenia, paranoid (HCC)  Reduced- temazepam  (RESTORIL ) 15 MG capsule; Take 1 capsule (15 mg total) by mouth at bedtime as needed for sleep.  Dispense: 30 capsule; Refill: 2  3. Anxiety state  Continue- traZODone  (DESYREL ) 100 MG tablet; Take 2 tablets (200 mg total) by mouth at bedtime.  Dispense: 60 tablet; Refill: 3 Continue- hydrOXYzine  (ATARAX ) 25 MG tablet; Take 1 tablet (25 mg total) by mouth 3 (three) times daily as needed.  Dispense: 90 tablet; Refill: 3   Collaboration of Care: Collaboration of Care: Other provider involved in patient's care AEB PCP  Patient/Guardian was advised Release of Information must be obtained prior to any record release in order to collaborate their care with an outside provider. Patient/Guardian was advised if they have not already done so to contact the registration department to sign all necessary forms in order for us  to release information regarding their care.   Consent: Patient/Guardian gives verbal consent for treatment and assignment of benefits for services provided during  this visit. Patient/Guardian expressed understanding and agreed to proceed.   Follow up in 3 months   Arlyne Bering, NP 06/03/2023, 2:06 PM

## 2023-06-14 ENCOUNTER — Other Ambulatory Visit: Payer: Medicare HMO

## 2023-06-14 DIAGNOSIS — E1142 Type 2 diabetes mellitus with diabetic polyneuropathy: Secondary | ICD-10-CM

## 2023-06-15 LAB — HEMOGLOBIN A1C
Est. average glucose Bld gHb Est-mCnc: 123 mg/dL
Hgb A1c MFr Bld: 5.9 % — ABNORMAL HIGH (ref 4.8–5.6)

## 2023-06-17 ENCOUNTER — Ambulatory Visit (INDEPENDENT_AMBULATORY_CARE_PROVIDER_SITE_OTHER): Payer: Medicare HMO | Admitting: Internal Medicine

## 2023-06-17 ENCOUNTER — Encounter: Payer: Self-pay | Admitting: Internal Medicine

## 2023-06-17 VITALS — BP 110/62 | HR 83 | Resp 16 | Ht 61.5 in | Wt 181.5 lb

## 2023-06-17 DIAGNOSIS — I10 Essential (primary) hypertension: Secondary | ICD-10-CM

## 2023-06-17 DIAGNOSIS — B351 Tinea unguium: Secondary | ICD-10-CM | POA: Diagnosis not present

## 2023-06-17 DIAGNOSIS — E785 Hyperlipidemia, unspecified: Secondary | ICD-10-CM

## 2023-06-17 DIAGNOSIS — G5603 Carpal tunnel syndrome, bilateral upper limbs: Secondary | ICD-10-CM

## 2023-06-17 DIAGNOSIS — E1142 Type 2 diabetes mellitus with diabetic polyneuropathy: Secondary | ICD-10-CM

## 2023-06-17 DIAGNOSIS — E669 Obesity, unspecified: Secondary | ICD-10-CM

## 2023-06-17 DIAGNOSIS — E559 Vitamin D deficiency, unspecified: Secondary | ICD-10-CM | POA: Diagnosis not present

## 2023-06-17 DIAGNOSIS — G2401 Drug induced subacute dyskinesia: Secondary | ICD-10-CM

## 2023-06-17 NOTE — Progress Notes (Signed)
 Subjective:    Patient ID: Kristine Atkinson, female   DOB: [birth year], 68 y.o.   MRN: [id number]   HPI   Vitamin D  deficiency with level at 9.6 back in December.  Improved to low normal range in April at 31.5.  Is scheduled for DEXA on 8/18 at 2 pm.  She continues to take the Vitamin D3 1000 units daily.  She is not certain about the calcium  citrate.  2.  Hypertension:  Blood pressure excellent.  No CP, dyspnea or LE edema.  3.  DM:  A1C excellent down to 5.9%.  Doing well with Xigduo  XR.  She is not interested in Ozempic as neither she nor her husband will consider giving her an injection, even if just once weekly.  She is also limited by her dyskinesia.  She has her eye appt on June 30th.   4.  Toenail onychomycosis:  only will use topicals.  She is no longer using terbinafine  cream.  States her feet are fine.     5.  Hyperlipidemia:  Rosuvastatin  20 mg daily.  Last panel was at goal save for mildly high trigs. Lipid Panel     Component Value Date/Time   CHOL 149 12/17/2022 1516   TRIG 172 (H) 12/17/2022 1516   HDL 52 12/17/2022 1516   CHOLHDL 4.8 08/27/2014 0902   VLDL 26 08/27/2014 0902   LDLCALC 68 12/17/2022 1516   LABVLDL 29 12/17/2022 1516   6.  HM:  mammogram normal in April.        Current Meds  Medication Sig   aspirin  81 MG tablet Take 1 tablet (81 mg total) by mouth daily.   carvedilol  (COREG ) 3.125 MG tablet Take 1 tablet (3.125 mg total) by mouth 2 (two) times daily.   Cholecalciferol (VITAMIN D3) 25 MCG (1000 UT) CAPS Take 1 capsule (1,000 Units total) by mouth daily.   Dapagliflozin  Pro-metFORMIN  ER (XIGDUO  XR) 5-500 MG TB24 Take 1 tablet by mouth 2 (two) times daily with a meal.   hydrochlorothiazide  (HYDRODIURIL ) 25 MG tablet Take 1 tablet (25 mg total) by mouth daily.   hydrOXYzine  (ATARAX ) 25 MG tablet Take 1 tablet (25 mg total) by mouth 3 (three) times daily as needed.   losartan  (COZAAR ) 50 MG tablet Take 1 tablet (50 mg total) by mouth daily.    meloxicam  (MOBIC ) 15 MG tablet TAKE 1 TABLET EVERY DAY WITH FOOD AS NEEDED FOR LEG PAIN   omeprazole  (PRILOSEC) 20 MG capsule Take 1 capsule (20 mg total) by mouth 2 (two) times daily.   pregabalin  (LYRICA ) 100 MG capsule 1 cap by mouth twice daily   rosuvastatin  (CRESTOR ) 20 MG tablet TAKE 1 TABLET EVERY EVENING WITH SUPPER   temazepam  (RESTORIL ) 15 MG capsule Take 1 capsule (15 mg total) by mouth at bedtime as needed for sleep.   terbinafine  (LAMISIL ) 1 % cream Apply to feet twice daily for 14 days or until flakiness resolves   traZODone  (DESYREL ) 100 MG tablet Take 2 tablets (200 mg total) by mouth at bedtime.   valbenazine  (INGREZZA ) 60 MG capsule Take 1 capsule (60 mg total) by mouth daily.   Allergies  Allergen Reactions   Advil  [Ibuprofen ] Nausea Only   Penicillins Itching    Did it involve swelling of the face/tongue/throat, SOB, or low BP? Unknown Did it involve sudden or severe rash/hives, skin peeling, or any reaction on the inside of your mouth or nose? Unknown Did you need to seek medical attention at a hospital  or doctor's office? Unknown When did it last happen? Unknown If all above answers are "NO", may proceed with cephalosporin use.     Review of Systems    Objective:   BP 110/62 (BP Location: Left Arm, Patient Position: Sitting, Cuff Size: Normal)   Pulse 83   Resp 16   Ht 5' 1.5" (1.562 m)   Wt 181 lb 8 oz (82.3 kg)   SpO2 96%   BMI 33.74 kg/m   Physical Exam Constant movement of mouth and hands, trunk HEENT:  PERRL, EOMI Neck:  Supple, No adenopathy Chest:  CTA CV:  RRR with normal S1 and S2, No S3, S4 or murmur.  Radial pulses normal and equal. Abd:  S, + BS LE:  No edema.  Great toenails with ends thickened, crumbling and lightly discolored.  Skin normal.  No callusing. Hands:  no swelling or erythema or joints.  + tinels at volar wrist, + Phalens.     Assessment & Plan   Vitamin D  deficiency:  resolved in April.  Continuing supplementation  with recheck in 3 more months  DEXA in August.  She is not taking calcium  citrate, but will get that started.  2.  DM:  well controlled.  No interest in Ozempic.  3.  Hypertension:  controlled  4.  Hyperlipidemia:  Trigs were a bit high, but otherwise at goal in December  5.  Carpal Tunnel Syndrome:  Recommend Cock up splints for bed time.  6.  Tardive dyskinesia:  remains an issue.  7.  Oncyhomycosis of toenails:  mild and does not want treatment.

## 2023-06-18 ENCOUNTER — Telehealth: Payer: Self-pay

## 2023-06-18 NOTE — Telephone Encounter (Signed)
 Per dr. Jayne Mews have patient to pick up cock splints to wear at bedtime to help with hand pain/numbness   Patient is aware and agrees

## 2023-07-12 LAB — OPHTHALMOLOGY REPORT-SCANNED

## 2023-08-09 ENCOUNTER — Other Ambulatory Visit: Payer: Self-pay | Admitting: Internal Medicine

## 2023-08-25 ENCOUNTER — Telehealth: Payer: Self-pay

## 2023-08-30 ENCOUNTER — Other Ambulatory Visit: Payer: Medicare HMO

## 2023-08-30 ENCOUNTER — Other Ambulatory Visit (HOSPITAL_BASED_OUTPATIENT_CLINIC_OR_DEPARTMENT_OTHER)

## 2023-08-31 ENCOUNTER — Encounter (HOSPITAL_COMMUNITY): Admitting: Psychiatry

## 2023-09-06 ENCOUNTER — Encounter (HOSPITAL_COMMUNITY): Payer: Self-pay | Admitting: Emergency Medicine

## 2023-09-06 ENCOUNTER — Ambulatory Visit (INDEPENDENT_AMBULATORY_CARE_PROVIDER_SITE_OTHER)

## 2023-09-06 ENCOUNTER — Ambulatory Visit (HOSPITAL_COMMUNITY)
Admission: EM | Admit: 2023-09-06 | Discharge: 2023-09-06 | Disposition: A | Discharge status: Home / Self Care | Attending: Emergency Medicine | Admitting: Emergency Medicine

## 2023-09-06 DIAGNOSIS — M25552 Pain in left hip: Secondary | ICD-10-CM

## 2023-09-06 MED ORDER — PREDNISONE 20 MG PO TABS
40.0000 mg | ORAL_TABLET | Freq: Every day | ORAL | 0 refills | Status: AC
Start: 2023-09-06 — End: 2023-09-09

## 2023-09-06 MED ORDER — DICLOFENAC SODIUM 1 % EX GEL: 2.0000 g | Freq: Four times a day (QID) | CUTANEOUS | 0 refills | Status: AC

## 2023-09-06 NOTE — ED Provider Notes (Signed)
 [facility]    CSN: [id number] Arrival date & time: 09/06/23  1248      History   Chief Complaint Chief Complaint  Patient presents with   Leg Pain    HPI Kristine Atkinson is a 68 y.o. female.   Patient presents with generalized left leg pain for about a year.  Patient states that the pain is mainly in her right hip and radiates down her leg.  Patient denies any known falls or injuries.  Patient states that she has been to her primary care provider and reports that they are doing nothing to help with the pain.  Patient states that she has been taking pregabalin  as prescribed by her primary care provider for her chronic pain which she reports does not help.  Patient is requesting an x-ray.  Past medical history includes fibromyalgia, hypertension, hyperlipidemia, diabetes, GERD, schizophrenia, and greater trochanteric bursitis of the left hip.  The history is provided by the patient and medical records.  Leg Pain   Past Medical History:  Diagnosis Date   Cellulitis 06/06/2013   Chest pain 06/07/2015   Chronic pain    Cocaine  abuse (HCC) 09/09/2012   Cocaine  positive on UDS 09/09/2012.     Diabetes mellitus    Elevated serum creatinine 08/14/2013   Fibromyalgia    Greater trochanteric bursitis of left hip 05/29/2014   Hyperlipidemia    Hypertension    Pneumonia due to COVID-19 virus 02/11/2019   Hospitalized with hypoxia, but did not require intubation/ventilation.   Schizophrenia (HCC)    Syncope 08/14/2013    Patient Active Problem List   Diagnosis Date Noted   Vitamin D  deficiency 06/17/2023   Onychomycosis 12/17/2022   Diabetic peripheral neuropathy associated with type 2 diabetes mellitus (HCC) 06/20/2022   Tardive dyskinesia 09/24/2020   Schizophrenia, paranoid (HCC) 06/27/2019   Pneumonia due to COVID-19 virus 02/11/2019   AKI (acute kidney injury) (HCC) 02/11/2019   COVID-19 02/11/2019   Pneumonia due to 2019 novel coronavirus 02/11/2019    Bilateral thigh pain 12/27/2015   Rash 09/05/2015   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 05/07/2015   Right shoulder pain 03/22/2015   Right wrist pain 03/22/2015   Left shoulder pain 02/14/2015   Anxiety and depression 02/14/2015   Peripheral autonomic neuropathy due to diabetes mellitus (HCC) 11/23/2014   Greater trochanteric bursitis of left hip 05/29/2014   Restrictive lung disease 10/10/2013   Diastolic dysfunction 08/17/2013   Obesity (BMI 30-39.9) 05/29/2013   Thickened endometrium 09/19/2012   Cocaine  abuse (HCC) 09/09/2012   Hyperlipidemia with target LDL less than 100 07/15/2011   Diabetes type 2, controlled (HCC) 06/01/2011   Primary hypertension 06/01/2011   GERD (gastroesophageal reflux disease) 06/01/2011   Former smoker 06/01/2011   Fibromyalgia 05/13/2011    Past Surgical History:  Procedure Laterality Date   OOPHORECTOMY     ectopic    OB History     Gravida  6   Para  3   Term  2   Preterm  1   AB  3   Living  3      SAB  1   IAB  2   Ectopic  0   Multiple  0   Live Births               Home Medications    Prior to Admission medications   Medication Sig Start Date End Date Taking? Authorizing Provider  diclofenac  Sodium (VOLTAREN  ARTHRITIS PAIN) 1 %  GEL Apply 2 g topically 4 (four) times daily. 09/06/23  Yes Johnie, Tiwan Schnitker A, NP  predniSONE  (DELTASONE ) 20 MG tablet Take 2 tablets (40 mg total) by mouth daily for 3 days. 09/06/23 09/09/23 Yes Johnie Flaming A, NP  aspirin  81 MG tablet Take 1 tablet (81 mg total) by mouth daily. 06/07/15   Cosme Rockey PARAS, MD  Calcium  Citrate 250 MG TABS 2 tabs by mouth twice daily Patient not taking: Reported on 06/17/2023 03/22/23   Adella Norris, MD  carvedilol  (COREG ) 3.125 MG tablet Take 1 tablet (3.125 mg total) by mouth 2 (two) times daily. 03/22/23   Adella Norris, MD  Cholecalciferol (VITAMIN D3) 25 MCG (1000 UT) CAPS Take 1 capsule (1,000 Units total) by mouth  daily. 03/22/23   Adella Norris, MD  Dapagliflozin  Pro-metFORMIN  ER (XIGDUO  XR) 5-500 MG TB24 Take 1 tablet by mouth 2 (two) times daily with a meal. 03/22/23   Adella Norris, MD  hydrochlorothiazide  (HYDRODIURIL ) 25 MG tablet Take 1 tablet (25 mg total) by mouth daily. 03/22/23   Adella Norris, MD  hydrOXYzine  (ATARAX ) 25 MG tablet Take 1 tablet (25 mg total) by mouth 3 (three) times daily as needed. 06/03/23   Harl Zane BRAVO, NP  losartan  (COZAAR ) 50 MG tablet Take 1 tablet (50 mg total) by mouth daily. 03/22/23   Adella Norris, MD  meloxicam  (MOBIC ) 15 MG tablet TAKE 1 TABLET EVERY DAY WITH FOOD AS NEEDED FOR LEG PAIN 03/22/23   Adella Norris, MD  omeprazole  (PRILOSEC) 20 MG capsule Take 1 capsule (20 mg total) by mouth 2 (two) times daily. 03/22/23   Adella Norris, MD  pregabalin  (LYRICA ) 100 MG capsule TAKE 1 CAPSULE BY MOUTH TWICE DAILY 08/13/23   Adella Norris, MD  rosuvastatin  (CRESTOR ) 20 MG tablet TAKE 1 TABLET EVERY EVENING WITH SUPPER 03/22/23   Adella Norris, MD  temazepam  (RESTORIL ) 15 MG capsule Take 1 capsule (15 mg total) by mouth at bedtime as needed for sleep. 06/03/23   Harl Zane BRAVO, NP  terbinafine  (LAMISIL ) 1 % cream Apply to feet twice daily for 14 days or until flakiness resolves 12/17/22   Adella Norris, MD  traZODone  (DESYREL ) 100 MG tablet Take 2 tablets (200 mg total) by mouth at bedtime. 06/03/23   Harl Zane BRAVO, NP  valbenazine  (INGREZZA ) 60 MG capsule Take 1 capsule (60 mg total) by mouth daily. 06/03/23   Harl Zane BRAVO, NP    Family History Family History  Problem Relation Age of Onset   Hypertension Mother    Cancer Father        not clear on history   Cancer Sister        Lung   Cancer Maternal Aunt        colon cancer   Breast cancer Niece    Colon cancer Neg Hx    Colon polyps Neg Hx    Rectal cancer Neg Hx    Stomach cancer Neg Hx    Esophageal cancer Neg Hx     Social History Social  History   Tobacco Use   Smoking status: Former    Current packs/day: 0.00    Average packs/day: 0.2 packs/day for 42.4 years (8.5 ttl pk-yrs)    Types: Cigarettes    Start date: 01/12/1973    Quit date: 06/13/2015    Years since quitting: 8.2    Passive exposure: Past   Smokeless tobacco: Never  Vaping Use   Vaping status: Never Used  Substance Use Topics   Alcohol  use: No    Alcohol/week: 0.0 standard drinks of alcohol   Drug use: Not Currently     Allergies   Advil  [ibuprofen ] and Penicillins   Review of Systems Review of Systems  Per HPI  Physical Exam Triage Vital Signs ED Triage Vitals  Encounter Vitals Group     BP 09/06/23 1357 (!) 164/90     Girls Systolic BP Percentile --      Girls Diastolic BP Percentile --      Boys Systolic BP Percentile --      Boys Diastolic BP Percentile --      Pulse Rate 09/06/23 1357 94     Resp 09/06/23 1357 19     Temp 09/06/23 1357 98.5 F (36.9 C)     Temp Source 09/06/23 1357 Oral     SpO2 09/06/23 1357 92 %     Weight --      Height --      Head Circumference --      Peak Flow --      Pain Score 09/06/23 1358 7     Pain Loc --      Pain Education --      Exclude from Growth Chart --    No data found.  Updated Vital Signs BP (!) 164/90 (BP Location: Right Arm)   Pulse 94   Temp 98.5 F (36.9 C) (Oral)   Resp 19   SpO2 92%   Visual Acuity Right Eye Distance:   Left Eye Distance:   Bilateral Distance:    Right Eye Near:   Left Eye Near:    Bilateral Near:     Physical Exam Vitals and nursing note reviewed.  Constitutional:      General: She is not in acute distress.    Appearance: Normal appearance. She is not ill-appearing.  Musculoskeletal:     Left hip: Tenderness present. No deformity. Normal range of motion.     Left upper leg: Normal.     Left knee: Normal.     Left lower leg: Normal.     Left ankle: Normal.       Legs:     Comments: Tenderness noted to lateral and posterior left hip region.   Neurological:     Mental Status: She is alert.      UC Treatments / Results  Labs (all labs ordered are listed, but only abnormal results are displayed) Labs Reviewed - No data to display  EKG   Radiology DG Hip Unilat With Pelvis 2-3 Views Left Result Date: 09/06/2023 CLINICAL DATA:  Hip pain. EXAM: DG HIP (WITH OR WITHOUT PELVIS) 2-3V LEFT COMPARISON:  None Available. FINDINGS: Mild hip joint space narrowing with acetabular and femoral head neck spurring. No fracture or subluxation. Mild enthesopathic change about the greater trochanter. No evidence of erosion, avascular necrosis or bony destructive change. The bones are subjectively under mineralized. Pubic symphysis and sacroiliac joints are congruent IMPRESSION: Mild osteoarthritis of the left hip. Electronically Signed   By: Andrea Gasman M.D.   On: 09/06/2023 15:19    Procedures Procedures (including critical care time)  Medications Ordered in UC Medications - No data to display  Initial Impression / Assessment and Plan / UC Course  I have reviewed the triage vital signs and the nursing notes.  Pertinent labs & imaging results that were available during my care of the patient were reviewed by me and considered in my medical decision making (see chart for details).  Patient is overall well-appearing.  Vitals are stable.  X-ray ordered.  Based on my interpretation there appears to be some evidence of osteoarthritis of the left hip.  Radiology report does confirm this.  Prescribed Voltaren  gel as needed for pain.  Prescribed short course of prednisone  for additional pain relief.  Given orthopedic follow-up.  Discussed follow-up and return precautions. Final Clinical Impressions(s) / UC Diagnoses   Final diagnoses:  Pain of left hip     Discharge Instructions      Your did not reveal any underlying fractures or dislocations.  It does reveal evidence of osteoarthritis to your hip.  You can apply Voltaren  gel  directly to your hip up to 4 times daily as needed for pain relief. Start taking 2 tablets of prednisone  once daily for 3 days for additional relief of pain.  You can follow-up with [facility] for further evaluation and management of your pain. Otherwise follow-up with your primary care provider or return here as needed.     ED Prescriptions     Medication Sig Dispense Auth. Provider   diclofenac  Sodium (VOLTAREN  ARTHRITIS PAIN) 1 % GEL Apply 2 g topically 4 (four) times daily. 50 g Johnie Flaming A, NP   predniSONE  (DELTASONE ) 20 MG tablet Take 2 tablets (40 mg total) by mouth daily for 3 days. 6 tablet Johnie Flaming A, NP      PDMP not reviewed this encounter.   Johnie Flaming A, NP 09/06/23 [phone]

## 2023-09-06 NOTE — Discharge Instructions (Signed)
 Your did not reveal any underlying fractures or dislocations.  It does reveal evidence of osteoarthritis to your hip.  You can apply Voltaren  gel directly to your hip up to 4 times daily as needed for pain relief. Start taking 2 tablets of prednisone  once daily for 3 days for additional relief of pain.  You can follow-up with [facility] for further evaluation and management of your pain. Otherwise follow-up with your primary care provider or return here as needed.

## 2023-09-06 NOTE — ED Triage Notes (Signed)
 Pt c/o left leg pain for a year. Reports her PCP not doing anything to help with the pain. Pt requesting xray on her leg. Denies any new injury to leg.

## 2023-09-15 ENCOUNTER — Other Ambulatory Visit (HOSPITAL_COMMUNITY): Payer: Self-pay | Admitting: Psychiatry

## 2023-09-15 DIAGNOSIS — F200 Paranoid schizophrenia: Secondary | ICD-10-CM

## 2023-09-21 ENCOUNTER — Encounter (HOSPITAL_COMMUNITY): Admitting: Psychiatry

## 2023-09-28 ENCOUNTER — Encounter (HOSPITAL_COMMUNITY): Admitting: Psychiatry

## 2023-09-28 NOTE — Telephone Encounter (Signed)
 Went to urgent care.

## 2023-09-30 ENCOUNTER — Encounter (HOSPITAL_COMMUNITY): Admitting: Psychiatry

## 2023-10-03 ENCOUNTER — Other Ambulatory Visit (HOSPITAL_COMMUNITY): Payer: Self-pay | Admitting: Psychiatry

## 2023-10-03 DIAGNOSIS — F411 Generalized anxiety disorder: Secondary | ICD-10-CM

## 2023-10-21 ENCOUNTER — Ambulatory Visit (INDEPENDENT_AMBULATORY_CARE_PROVIDER_SITE_OTHER): Admitting: Psychiatry

## 2023-10-21 DIAGNOSIS — F411 Generalized anxiety disorder: Secondary | ICD-10-CM

## 2023-10-21 DIAGNOSIS — F200 Paranoid schizophrenia: Secondary | ICD-10-CM

## 2023-10-21 MED ORDER — TRAZODONE HCL 100 MG PO TABS: 200.0000 mg | ORAL_TABLET | Freq: Every evening | ORAL | 3 refills | Status: DC | PRN

## 2023-10-21 MED ORDER — HYDROXYZINE HCL 25 MG PO TABS: 25.0000 mg | ORAL_TABLET | Freq: Three times a day (TID) | ORAL | 3 refills | Status: DC | PRN

## 2023-10-21 MED ORDER — TEMAZEPAM 15 MG PO CAPS: 15.0000 mg | ORAL_CAPSULE | Freq: Every evening | ORAL | 2 refills | Status: DC | PRN

## 2023-10-21 NOTE — Progress Notes (Addendum)
 [facility] MD/PA/NP OP Progress Note  10/21/2023 6:19 PM Kristine Atkinson  MRN:  [id number]  Chief Complaint: I stopped Ingrezza  it made my pain worse  HPI: 68 year old female seen today for follow up psychiatric evaluation. She has a psychiatric history of anxiety, depression, paranoid schizophrenia, and unspecified adult personality disorder. She is currently managed on Ingrezza  60 mg daily, Trazodone  200 mg nightly as needed, and Restoril  15 mg nightly as needed.  She informed Clinical research associate that her  medications are somewhat effective in managing her psychiatric conditions.  She notes that she has not Restoril  in a few months and informed writer that she discontinued ingrezza .  Today she is well groomed, pleasant, restless, and engaged in conversation. Patient informed writer that stopped Ingrezza  because she reports that it made her pain worse.  Patient informed writer that she has intense leg pains.  She quantifies her pain as 10 out of 10 today. She notes that she went to Urgent care and was told that she has arthritis. Patient asked writer what she can take for arthritis. Provider informed for patient that Aleve, ibuprofen , or aspirin  may be somewhat effective.  Provider encouraged patient to follow-up with her PCP to discuss further.  She endorsed understanding and agreed.   Patient notes that her anxiety and depression are well-managed.  She denies symptoms of psychosis.  She does note that her sleep has been poor and informed Clinical research associate that she had been without her Restoril  for a few months.  She request to restart it.  Patient informed Clinical research associate that she is only sleeping 30 minutes.   Patient reports that her anxiety and depression are well-managed.  Today she denies SI/HI/VAH, mania, or paranoia.    Patient notes that she has cataract surgery in December  Patient continues to suffer from TD however found Ingrezza  ineffective and discontinued it.  She has tried Austedo  it in the past without success.  At  this time patient does not wish to restart either of the medications.  Restoril  15 mg restarted.  She will continue her other medications as prescribed.  No other concerns no at this time.   Visit Diagnosis:    ICD-10-CM   1. Anxiety state  F41.1 traZODone  (DESYREL ) 100 MG tablet    hydrOXYzine  (ATARAX ) 25 MG tablet    2. Schizophrenia, paranoid (HCC)  F20.0 temazepam  (RESTORIL ) 15 MG capsule           Past Psychiatric History: anxiety, depression, paranoid schizophrenia, and unspecified adult personality disorder  Past Medical History:  Past Medical History:  Diagnosis Date   Cellulitis 06/06/2013   Chest pain 06/07/2015   Chronic pain    Cocaine  abuse (HCC) 09/09/2012   Cocaine  positive on UDS 09/09/2012.     Diabetes mellitus    Elevated serum creatinine 08/14/2013   Fibromyalgia    Greater trochanteric bursitis of left hip 05/29/2014   Hyperlipidemia    Hypertension    Pneumonia due to COVID-19 virus 02/11/2019   Hospitalized with hypoxia, but did not require intubation/ventilation.   Schizophrenia (HCC)    Syncope 08/14/2013    Past Surgical History:  Procedure Laterality Date   OOPHORECTOMY     ectopic    Family Psychiatric History: None reported   Family History:  Family History  Problem Relation Age of Onset   Hypertension Mother    Cancer Father        not clear on history   Cancer Sister        Lung  Cancer Maternal Aunt        colon cancer   Breast cancer Niece    Colon cancer Neg Hx    Colon polyps Neg Hx    Rectal cancer Neg Hx    Stomach cancer Neg Hx    Esophageal cancer Neg Hx     Social History:  Social History   Socioeconomic History   Marital status: Married    Spouse name: Kristine Atkinson   Number of children: 3   Years of education: Not on file   Highest education level: 11th grade  Occupational History   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     Social Drivers of Corporate investment banker Strain: Low Risk  (12/17/2022)   Overall Financial Resource Strain (CARDIA)    Difficulty of Paying Living Expenses: Not hard at all  Food Insecurity: No Food Insecurity (12/17/2022)   Hunger Vital Sign    Worried About Running Out of Food in the Last Year: Never true    Ran Out of Food in the Last Year: Never true  Transportation Needs: Unmet Transportation Needs (12/17/2022)   PRAPARE - Administrator, Civil Service (Medical): Yes    Lack of Transportation (Non-Medical): No  Physical Activity: Not on file  Stress: Not on file  Social Connections: Not on file    Allergies:  Allergies  Allergen Reactions   Advil  [Ibuprofen ] Nausea Only   Penicillins Itching    Did it involve swelling of the face/tongue/throat, SOB, or low BP? Unknown Did it involve sudden or severe rash/hives, skin peeling, or any reaction on the inside of your mouth or nose? Unknown Did you need to seek medical attention at a hospital or doctor's office? Unknown When did it last happen? Unknown If all above answers are "NO", may proceed with cephalosporin use.    Metabolic Disorder Labs: Lab Results  Component Value Date   HGBA1C 5.9 (H) 06/14/2023   No results found for: PROLACTIN Lab Results  Component Value Date   CHOL 149 12/17/2022   TRIG 172 (H) 12/17/2022   HDL 52 12/17/2022   CHOLHDL 4.8 08/27/2014    VLDL 26 08/27/2014   LDLCALC 68 12/17/2022   LDLCALC 64 06/01/2022   Lab Results  Component Value Date   TSH 0.657 05/21/2021   TSH 0.667 03/02/2012    Therapeutic Level Labs: No results found for: LITHIUM No results found for: VALPROATE No results found for: CBMZ  Current Medications: Current Outpatient Medications  Medication Sig Dispense Refill   aspirin  81 MG tablet Take 1 tablet (81 mg total) by mouth daily. 100 tablet 1   Calcium  Citrate 250 MG TABS 2 tabs by mouth twice daily (Patient not taking: Reported on 06/17/2023) 180  tablet 3   carvedilol  (COREG ) 3.125 MG tablet Take 1 tablet (3.125 mg total) by mouth 2 (two) times daily. 180 tablet 3   Cholecalciferol (VITAMIN D3) 25 MCG (1000 UT) CAPS Take 1 capsule (1,000 Units total) by mouth daily. 30 capsule 11   Dapagliflozin  Pro-metFORMIN  ER (XIGDUO  XR) 5-500 MG TB24 Take 1 tablet by mouth 2 (two) times daily with a meal. 180 tablet 3   diclofenac  Sodium (VOLTAREN  ARTHRITIS PAIN) 1 % GEL Apply 2 g topically 4 (four) times daily. 50 g 0   hydrochlorothiazide  (HYDRODIURIL ) 25 MG tablet Take 1 tablet (25 mg total) by mouth daily. 90 tablet 3   hydrOXYzine  (ATARAX ) 25 MG tablet Take 1 tablet (25 mg total) by mouth 3 (three) times daily as needed. 90 tablet 3   losartan  (COZAAR ) 50 MG tablet Take 1 tablet (50 mg total) by mouth daily. 90 tablet 3   meloxicam  (MOBIC ) 15 MG tablet TAKE 1 TABLET EVERY DAY WITH FOOD AS NEEDED FOR LEG PAIN 60 tablet 5   omeprazole  (PRILOSEC) 20 MG capsule Take 1 capsule (20 mg total) by mouth 2 (two) times daily. 120 capsule 5   pregabalin  (LYRICA ) 100 MG capsule TAKE 1 CAPSULE BY MOUTH TWICE DAILY 180 capsule 3   rosuvastatin  (CRESTOR ) 20 MG tablet TAKE 1 TABLET EVERY EVENING WITH SUPPER 90 tablet 3   temazepam  (RESTORIL ) 15 MG capsule Take 1 capsule (15 mg total) by mouth at bedtime as needed for sleep. 30 capsule 2   terbinafine  (LAMISIL ) 1 % cream Apply to feet twice daily for 14 days or until  flakiness resolves 30 g 1   traZODone  (DESYREL ) 100 MG tablet Take 2 tablets (200 mg total) by mouth at bedtime as needed for sleep. 60 tablet 3   No current facility-administered medications for this visit.     Musculoskeletal: Strength & Muscle Tone: within normal limits Gait & Station: normal Patient leans: N/A  Psychiatric Specialty Exam: Review of Systems  Blood pressure 139/66, pulse 100, temperature 97.8 F (36.6 C), height 5' 3 (1.6 m), weight 177 lb (80.3 kg), SpO2 100%.Body mass index is 31.35 kg/m.  General Appearance: Well Groomed  Eye Contact:  Good  Speech:  Slurred  Volume:  Normal  Mood:  Euthymic  Affect:  Appropriate and Congruent  Thought Process:  Coherent, Goal Directed, and Linear  Orientation:  Full (Time, Place, and Person)  Thought Content: WDL and Logical   Suicidal Thoughts:  No  Homicidal Thoughts:  No  Memory:  Immediate;   Good Recent;   Good Remote;   Good  Judgement:  Good  Insight:  Good  Psychomotor Activity:  Normal  Concentration:  Concentration: Good and Attention Span: Good  Recall:  Good  Fund of Knowledge: Good  Language: Good  Akathisia:  No  Handed:  Right  AIMS (if indicated):  done, 11  Assets:  Communication Skills Desire for Improvement Financial Resources/Insurance Housing Intimacy Leisure Time Physical Health Social Support  ADL's:  Intact  Cognition: WNL  Sleep:  Fair   Screenings: AIMS    Flowsheet Row Clinical Support from 06/03/2023 in [facility] Clinical Support from 03/11/2023 in [facility] Clinical Support from 12/28/2022 in [facility] Clinical Support from 09/08/2022 in [facility] Office Visit from 06/09/2022 in [facility]  AIMS Total Score 9 8 14 14 6    GAD-7    Flowsheet Row Clinical Support from  12/28/2022 in [facility]  Clinical Support from 09/08/2022 in [facility] Office Visit from 06/09/2022 in [facility] Office Visit from 03/12/2022 in [facility] Office Visit from 12/18/2021 in [facility]  Total GAD-7 Score 12 7 6 13  0   PHQ2-9    Flowsheet Row Clinical Support from 12/28/2022 in [facility] Clinical Support from 09/08/2022 in [facility] Office Visit from 06/09/2022 in [facility] Office Visit from 03/12/2022 in [facility] Office Visit from 12/18/2021 in [facility]  PHQ-2 Total Score 0 3 0 4 0  PHQ-9 Total Score 6 14 0 13 --   Flowsheet Row Office Visit from 12/18/2021 in [facility] Office Visit from 09/18/2021 in [facility] Office Visit from 06/19/2021 in [facility]  C-SSRS RISK CATEGORY No Risk No Risk No Risk     Assessment and Plan: Patient continues to suffer from TD however found Ingrezza  ineffective and discontinued it.  She has tried Austedo  it in the past without success.  At this time patient does not wish to restart either of the medications.  Restoril  15 mg restarted.  She will continue her other medications as prescribed.    1. Anxiety state  Continue- traZODone  (DESYREL ) 100 MG tablet; Take 2 tablets (200 mg total) by mouth at bedtime as needed for sleep.  Dispense: 60 tablet; Refill: 3 Continue- hydrOXYzine  (ATARAX ) 25 MG tablet; Take 1 tablet (25 mg total) by mouth 3 (three) times daily as needed.  Dispense: 90 tablet; Refill: 3  2. Schizophrenia, paranoid (HCC)  Restart- temazepam  (RESTORIL ) 15 MG capsule; Take 1 capsule (15 mg total) by mouth at bedtime as needed for sleep.  Dispense: 30 capsule; Refill: 2   Collaboration of Care: Collaboration  of Care: Other provider involved in patient's care AEB PCP  Patient/Guardian was advised Release of Information must be obtained prior to any record release in order to collaborate their care with an outside provider. Patient/Guardian was advised if they have not already done so to contact the registration department to sign all necessary forms in order for us  to release information regarding their care.   Consent: Patient/Guardian gives verbal consent for treatment and assignment of benefits for services provided during this visit. Patient/Guardian expressed understanding and agreed to proceed.   Follow up in 3 months   Zane FORBES Bach, NP 10/21/2023, 6:19 PM

## 2023-10-22 ENCOUNTER — Telehealth (HOSPITAL_COMMUNITY): Payer: Self-pay

## 2023-10-22 NOTE — Telephone Encounter (Signed)
 went online to [email or web address] and submitted the prior auth . - pending

## 2023-10-22 NOTE — Telephone Encounter (Signed)
 received fax that a prior auth is needed for the temazepam .

## 2023-10-25 NOTE — Telephone Encounter (Signed)
 Prior Authorization for Temazepam  has been denied by patient's insurance.

## 2023-10-26 NOTE — Telephone Encounter (Signed)
Thank you for this update.  Please notify the patient.

## 2023-10-29 NOTE — Telephone Encounter (Signed)
 submitted a appeal for the temazepam . - pending

## 2023-12-22 ENCOUNTER — Telehealth: Payer: Self-pay

## 2023-12-23 ENCOUNTER — Encounter: Payer: Self-pay | Admitting: Internal Medicine

## 2023-12-23 ENCOUNTER — Other Ambulatory Visit: Payer: Medicare HMO

## 2023-12-23 ENCOUNTER — Ambulatory Visit (INDEPENDENT_AMBULATORY_CARE_PROVIDER_SITE_OTHER): Admitting: Internal Medicine

## 2023-12-23 VITALS — BP 140/80 | HR 86 | Resp 18 | Ht 61.5 in | Wt 177.0 lb

## 2023-12-23 DIAGNOSIS — E1142 Type 2 diabetes mellitus with diabetic polyneuropathy: Secondary | ICD-10-CM

## 2023-12-23 DIAGNOSIS — M79605 Pain in left leg: Secondary | ICD-10-CM | POA: Diagnosis not present

## 2023-12-23 DIAGNOSIS — E785 Hyperlipidemia, unspecified: Secondary | ICD-10-CM

## 2023-12-23 MED ORDER — ACETAMINOPHEN 500 MG PO TABS: ORAL_TABLET | ORAL | Status: AC

## 2023-12-23 NOTE — Patient Instructions (Addendum)
 Bring in all medications to appt next week.    STop BC powders and aspirin  for pain Only one meloxicam  daily with food.

## 2023-12-23 NOTE — Progress Notes (Signed)
 Subjective:    Patient ID: Kristine Atkinson, female   DOB: 09-22-1955, 68 y.o.   MRN: [id number]   HPI  Was having labs drawn today for visit next week and complained of significant left leg pain, stating she could not wait until March for her ortho visit.  Not clear if this was [facility] referral from Urgent Care in August.   Stated she was having left lower leg pain and her knee was xrayed in August and told she had arthritis. She actually had Left hip xrayed, which showed mild arthritis. Has been using Meloxicam  15 mg twice daily,  BC powders and ASA for the pain.  Discussed to only use Meloxicam  once daily and to stop other antiinflammatories.  She is not using Lyrica --not clear why or when stopped as appeared she was taking in August UC visit.   Topical diflucan did not help.  No history of injury. No back pain When asked during this secondary visit, she is not having knee pain.    Current Meds  Medication Sig   aspirin  81 MG tablet Take 1 tablet (81 mg total) by mouth daily.   carvedilol  (COREG ) 3.125 MG tablet Take 1 tablet (3.125 mg total) by mouth 2 (two) times daily.   Cholecalciferol (VITAMIN D3) 25 MCG (1000 UT) CAPS Take 1 capsule (1,000 Units total) by mouth daily.   Dapagliflozin  Pro-metFORMIN  ER (XIGDUO  XR) 5-500 MG TB24 Take 1 tablet by mouth 2 (two) times daily with a meal.   hydrochlorothiazide  (HYDRODIURIL ) 25 MG tablet Take 1 tablet (25 mg total) by mouth daily.   hydrOXYzine  (ATARAX ) 25 MG tablet Take 1 tablet (25 mg total) by mouth 3 (three) times daily as needed.   losartan  (COZAAR ) 50 MG tablet Take 1 tablet (50 mg total) by mouth daily.   meloxicam  (MOBIC ) 15 MG tablet TAKE 1 TABLET EVERY DAY WITH FOOD AS NEEDED FOR LEG PAIN   omeprazole  (PRILOSEC) 20 MG capsule Take 1 capsule (20 mg total) by mouth 2 (two) times daily.   rosuvastatin  (CRESTOR ) 20 MG tablet TAKE 1 TABLET EVERY EVENING WITH SUPPER   temazepam  (RESTORIL ) 15 MG capsule Take 1 capsule (15 mg  total) by mouth at bedtime as needed for sleep.   traZODone  (DESYREL ) 100 MG tablet Take 2 tablets (200 mg total) by mouth at bedtime as needed for sleep.   Allergies Allergen Reactions   Advil  [Ibuprofen ] Nausea Only   Penicillins Itching    Did it involve swelling of the face/tongue/throat, SOB, or low BP? Unknown Did it involve sudden or severe rash/hives, skin peeling, or any reaction on the inside of your mouth or nose? Unknown Did you need to seek medical attention at a hospital or doctor's office? Unknown When did it last happen? Unknown If all above answers are NO, may proceed with cephalosporin use.     Review of Systems    Objective:   BP (!) 140/80 (BP Location: Right Arm, Patient Position: Sitting)   Pulse 86   Resp 18   Ht 5' 1.5 (1.562 m)   Wt 177 lb (80.3 kg)   BMI 32.90 kg/m   Physical Exam NT over LS spinous processes  and paraspinous musculature She does have tenderness somewhat overlying SI joint and greater trochanter, but not clear if exacerbated pain in lower leg. Full ROM of hip, not clear if exacerbates pain with ROM movements.   Full ROM of knee and NT without effusion or laxity of ligaments.   Tender over  gastroc/soleus muscle bellies, which are writhing throughout exam with tardive dyskinesia.   No edema   Assessment & Plan  Left leg pain:  not clear if lower leg pain a product of a problem more proximal in leg.  She was found to have mild OA of hip in August.   Not clear if she is taking Lyrica .  She has an appt next week and will bring in all of her meds to clarify. She is to take only Meloxicam  once daily with history of elevated creatinine.   Hold BC powders and ASA more than her usual 81 mg daily.   Referral to PT and [facility].

## 2023-12-24 LAB — CBC WITH DIFFERENTIAL/PLATELET
Basophils Absolute: 0.1 x10E3/uL (ref 0.0–0.2)
Basos: 1 %
EOS (ABSOLUTE): 0.1 x10E3/uL (ref 0.0–0.4)
Eos: 2 %
Hematocrit: 38.3 % (ref 34.0–46.6)
Hemoglobin: 12.3 g/dL (ref 11.1–15.9)
Immature Grans (Abs): 0 x10E3/uL (ref 0.0–0.1)
Immature Granulocytes: 0 %
Lymphocytes Absolute: 2.8 x10E3/uL (ref 0.7–3.1)
Lymphs: 35 %
MCH: 29.6 pg (ref 26.6–33.0)
MCHC: 32.1 g/dL (ref 31.5–35.7)
MCV: 92 fL (ref 79–97)
Monocytes Absolute: 0.5 x10E3/uL (ref 0.1–0.9)
Monocytes: 7 %
Neutrophils Absolute: 4.3 x10E3/uL (ref 1.4–7.0)
Neutrophils: 55 %
Platelets: 336 x10E3/uL (ref 150–450)
RBC: 4.15 x10E6/uL (ref 3.77–5.28)
RDW: 16.3 % — ABNORMAL HIGH (ref 11.7–15.4)
WBC: 7.8 x10E3/uL (ref 3.4–10.8)

## 2023-12-24 LAB — COMPREHENSIVE METABOLIC PANEL WITH GFR
ALT: 11 IU/L (ref 0–32)
AST: 17 IU/L (ref 0–40)
Albumin: 4.3 g/dL (ref 3.9–4.9)
Alkaline Phosphatase: 78 IU/L (ref 49–135)
BUN/Creatinine Ratio: 16 (ref 12–28)
BUN: 16 mg/dL (ref 8–27)
Bilirubin Total: 0.2 mg/dL (ref 0.0–1.2)
CO2: 25 mmol/L (ref 20–29)
Calcium: 9.6 mg/dL (ref 8.7–10.3)
Chloride: 99 mmol/L (ref 96–106)
Creatinine, Ser: 1.03 mg/dL — ABNORMAL HIGH (ref 0.57–1.00)
Globulin, Total: 2.2 g/dL (ref 1.5–4.5)
Glucose: 113 mg/dL — ABNORMAL HIGH (ref 70–99)
Potassium: 4.3 mmol/L (ref 3.5–5.2)
Sodium: 138 mmol/L (ref 134–144)
Total Protein: 6.5 g/dL (ref 6.0–8.5)
eGFR: 60 mL/min/1.73 (ref >59)

## 2023-12-24 LAB — HEMOGLOBIN A1C
Est. average glucose Bld gHb Est-mCnc: 120 mg/dL
Hgb A1c MFr Bld: 5.8 % — ABNORMAL HIGH (ref 4.8–5.6)

## 2023-12-24 LAB — LIPID PANEL W/O CHOL/HDL RATIO
Cholesterol, Total: 167 mg/dL (ref 100–199)
HDL: 60 mg/dL (ref >39)
LDL Chol Calc (NIH): 91 mg/dL (ref 0–99)
Triglycerides: 84 mg/dL (ref 0–149)
VLDL Cholesterol Cal: 16 mg/dL (ref 5–40)

## 2023-12-24 LAB — MICROALBUMIN / CREATININE URINE RATIO

## 2023-12-24 NOTE — Telephone Encounter (Signed)
 Patient will come later at 2

## 2023-12-30 ENCOUNTER — Ambulatory Visit: Payer: Medicare HMO | Admitting: Internal Medicine

## 2023-12-30 ENCOUNTER — Encounter: Payer: Self-pay | Admitting: Internal Medicine

## 2023-12-30 VITALS — BP 160/90 | HR 90 | Resp 16 | Ht 61.5 in | Wt 176.0 lb

## 2023-12-30 DIAGNOSIS — I10 Essential (primary) hypertension: Secondary | ICD-10-CM

## 2023-12-30 DIAGNOSIS — Z5948 Other specified lack of adequate food: Secondary | ICD-10-CM

## 2023-12-30 DIAGNOSIS — M79605 Pain in left leg: Secondary | ICD-10-CM

## 2023-12-30 DIAGNOSIS — E785 Hyperlipidemia, unspecified: Secondary | ICD-10-CM

## 2023-12-30 DIAGNOSIS — E1142 Type 2 diabetes mellitus with diabetic polyneuropathy: Secondary | ICD-10-CM

## 2023-12-30 DIAGNOSIS — E669 Obesity, unspecified: Secondary | ICD-10-CM

## 2023-12-30 DIAGNOSIS — Z0000 Encounter for general adult medical examination without abnormal findings: Secondary | ICD-10-CM

## 2023-12-30 NOTE — Progress Notes (Unsigned)
 Subjective:    Patient ID: Kristine Atkinson, female   DOB: 01-19-1955, 68 y.o.   MRN: [id number]   HPI  CPE without pap  1.  Pap:  Last pap normal in 2021.  Always normal.    2.  Mammogram:  Last in 4.2025 and normal.  No family history of breast cancer.    3.  Osteoprevention:  Taking 1 Citracal with D3 or 400 mg calcium  and 500 international units  Vit D3 twice daily and then D3 1000 units daily.  Walks around home, but not away from home.  She does not describe going outside much.    4.  Guaiac Cards/FIT:  Last 09/2019 and negative for blood.  5.  Colonoscopy:  Last performed 09/2022 with Dr. Legrand.  One tubular adenoma without high grade dysplasia.  Due again in 2029.  No family history of colon cancer.    6.  Immunizations:  Up to date on vaccines Immunization History  Administered Date(s) Administered   Fluad Trivalent(High Dose 65+) 10/05/2022   Influenza Inj Mdck Quad Pf 11/23/2016, 11/25/2018   Influenza,inj,Quad PF,6+ Mos 09/30/2012, 10/23/2013, 11/16/2014, 09/26/2015   Influenza-Unspecified 10/14/2020, 10/14/2021, 12/26/2023   Moderna Covid-19 Fall Seasonal Vaccine 72yrs & older 08/24/2022   Moderna Covid-19 Vaccine  Bivalent Booster 107yrs & up 10/14/2020   Moderna SARS-COV2 Booster Vaccination 11/16/2019   Moderna Sars-Covid-2 Vaccination 04/06/2019, 05/09/2019   PFIZER(Purple Top)SARS-COV-2 Vaccination 12/26/2023   PNEUMOCOCCAL CONJUGATE-20 02/17/2022   Pfizer(Comirnaty)Fall Seasonal Vaccine 12 years and older 12/23/2022   Pneumococcal Polysaccharide-23 07/14/2011, 02/12/2019   Td 08/10/2022   Tdap 07/14/2011   Zoster Recombinant(Shingrix ) 03/24/2022, 10/05/2022     7.  Glucose/Cholesterol:  A1C improved to 5.8%.   LDL not quite at goal below 70, rest of lipid panel fine. Lipid Panel     Component Value Date/Time   CHOL 167 12/23/2023 1405   TRIG 84 12/23/2023 1405   HDL 60 12/23/2023 1405   CHOLHDL 4.8 08/27/2014 0902   VLDL 26 08/27/2014 0902    LDLCALC 91 12/23/2023 1405   LABVLDL 16 12/23/2023 1405     8.  Pain in left leg:  she brought meds today and finally realizes she has been out of lyrica /pregabalin  for almost 3 months.  She did not understand last visit that she was supposed to be taking.    9.  CKD, mild:  last GFR stable around 60..  Current Meds  Medication Sig   acetaminophen  (TYLENOL ) 500 MG tablet 2 tabs by mouth twice daily   aspirin  81 MG tablet Take 1 tablet (81 mg total) by mouth daily.   Calcium  Citrate 250 MG TABS 2 tabs by mouth twice daily   carvedilol  (COREG ) 3.125 MG tablet Take 1 tablet (3.125 mg total) by mouth 2 (two) times daily.   Cholecalciferol (VITAMIN D3) 25 MCG (1000 UT) CAPS Take 1 capsule (1,000 Units total) by mouth daily.   Dapagliflozin  Pro-metFORMIN  ER (XIGDUO  XR) 5-500 MG TB24 Take 1 tablet by mouth 2 (two) times daily with a meal.   hydrochlorothiazide  (HYDRODIURIL ) 25 MG tablet Take 1 tablet (25 mg total) by mouth daily.   losartan  (COZAAR ) 50 MG tablet Take 1 tablet (50 mg total) by mouth daily.   meloxicam  (MOBIC ) 15 MG tablet TAKE 1 TABLET EVERY DAY WITH FOOD AS NEEDED FOR LEG PAIN   omeprazole  (PRILOSEC) 20 MG capsule Take 1 capsule (20 mg total) by mouth 2 (two) times daily.   rosuvastatin  (CRESTOR ) 20 MG tablet TAKE 1 TABLET  EVERY EVENING WITH SUPPER   traZODone  (DESYREL ) 100 MG tablet Take 2 tablets (200 mg total) by mouth at bedtime as needed for sleep.   Allergies  Allergen Reactions   Advil  [Ibuprofen ] Nausea Only   Penicillins Itching    Did it involve swelling of the face/tongue/throat, SOB, or low BP? Unknown Did it involve sudden or severe rash/hives, skin peeling, or any reaction on the inside of your mouth or nose? Unknown Did you need to seek medical attention at a hospital or doctor's office? Unknown When did it last happen? Unknown If all above answers are NO, may proceed with cephalosporin use.   Past Medical History:  Diagnosis Date   Cellulitis 06/06/2013    Chest pain 06/07/2015   Chronic pain    Cocaine  abuse (HCC) 09/09/2012   Cocaine  positive on UDS 09/09/2012.     Diabetes mellitus    Elevated serum creatinine 08/14/2013   Fibromyalgia    Greater trochanteric bursitis of left hip 05/29/2014   Hyperlipidemia    Hypertension    Pneumonia due to COVID-19 virus 02/11/2019   Hospitalized with hypoxia, but did not require intubation/ventilation.   Schizophrenia (HCC)    Syncope 08/14/2013   Past Surgical History:  Procedure Laterality Date   OOPHORECTOMY     ectopic   Family History  Problem Relation Age of Onset   Hypertension Mother    Cancer Father        not clear on history   Cancer Sister        Lung   Heart disease Son 66   Cancer Maternal Aunt        colon cancer   Breast cancer Niece    Colon cancer Neg Hx    Colon polyps Neg Hx    Rectal cancer Neg Hx    Stomach cancer Neg Hx    Esophageal cancer Neg Hx    Social History   Socioeconomic History   Marital status: Married    Spouse name: Fanny Agan   Number of children: 2   Years of education: Not on file   Highest education level: 11th grade  Occupational History   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     Social Drivers of Health   Tobacco Use: Medium Risk (12/30/2023)   Patient History    Smoking Tobacco Use: Former    Smokeless Tobacco  Use: Never    Passive Exposure: Past  Physicist, Medical Strain: Low Risk (12/17/2022)   Overall Financial Resource Strain (CARDIA)    Difficulty of Paying Living Expenses: Not hard at all  Food Insecurity: Food Insecurity Present (12/30/2023)   Epic    Worried About Radiation Protection Practitioner of Food in the Last Year: Sometimes true    Ran Out  of Food in the Last Year: Sometimes true  Transportation Needs: Unmet Transportation Needs (12/17/2022)   PRAPARE - Administrator, Civil Service (Medical): Yes    Lack of Transportation (Non-Medical): No  Physical Activity: Not on file  Stress: Not on file  Social Connections: Not on file  Intimate Partner Violence: Not on file  Depression (EYV7-0): Medium Risk (12/28/2022)   Depression (PHQ2-9)    PHQ-2 Score: 6  Alcohol Screen: Not on file  Housing: Low Risk (12/17/2022)   Housing    Last Housing Risk Score: 0  Utilities: Not At Risk (12/17/2022)   A[employer] Utilities    Threatened with loss of utilities: No  Health Literacy: Not on file      Review of Systems  HENT:  Negative for dental problem (no dental).   Eyes:  Negative for visual disturbance (Dr. Octavia saw her yesterday.).  Respiratory:  Negative for shortness of breath.   Cardiovascular:  Negative for chest pain, palpitations and leg swelling.  Musculoskeletal:        Continue with left leg pain, but it is clear she has been missing her pregabalin  since beginning of September.        Objective:   BP (!) 160/90 (BP Location: Right Arm, Patient Position: Sitting, Cuff Size: Normal)   Pulse 90   Resp 16   Ht 5' 1.5 (1.562 m)   Wt 176 lb (79.8 kg)   BMI 32.72 kg/m   Physical Exam Constitutional:      Appearance: She is obese.  HENT:     Head: Normocephalic and atraumatic.     Right Ear: Tympanic membrane, ear canal and external ear normal.     Left Ear: Tympanic membrane, ear canal and external ear normal.     Nose: Nose normal.     Mouth/Throat:     Mouth: Mucous  membranes are moist.     Pharynx: Oropharynx is clear.     Comments: Edentulous uppers Eyes:     Extraocular Movements: Extraocular movements intact.     Conjunctiva/sclera: Conjunctivae normal.     Pupils: Pupils are equal, round, and reactive to light.  Genitourinary:    General: Normal vulva.     Comments: Difficulty getting patient relaxed for pelvic.  No obvious uterine or adnexal mass or tenderness. Musculoskeletal:     Cervical back: Normal range of motion and neck supple.  Neurological:     Mental Status: She is alert.      Assessment & Plan

## 2024-01-10 ENCOUNTER — Ambulatory Visit

## 2024-01-10 VITALS — BP 130/60

## 2024-01-10 DIAGNOSIS — F200 Paranoid schizophrenia: Secondary | ICD-10-CM | POA: Diagnosis not present

## 2024-01-10 DIAGNOSIS — F419 Anxiety disorder, unspecified: Secondary | ICD-10-CM

## 2024-01-10 DIAGNOSIS — F32A Depression, unspecified: Secondary | ICD-10-CM

## 2024-01-10 NOTE — Progress Notes (Deleted)
 Pt said she had to run after BP check because her ride was here.  She did not want to wait for a detailed questioning of her Lyrica  or for me to get Dr. Adella to assess.  I briefly addressed it. She said she was doing good on the Lyrica  and her psychological state was better. On exam , pt did not have akethesias or other unusual movements; she did demonstrate some anxiety. Says she has appt with her therapist.

## 2024-01-11 ENCOUNTER — Other Ambulatory Visit: Payer: Self-pay

## 2024-01-11 ENCOUNTER — Ambulatory Visit: Attending: Internal Medicine

## 2024-01-11 ENCOUNTER — Encounter: Payer: Self-pay | Admitting: Internal Medicine

## 2024-01-11 DIAGNOSIS — M6281 Muscle weakness (generalized): Secondary | ICD-10-CM | POA: Diagnosis present

## 2024-01-11 DIAGNOSIS — M2560 Stiffness of unspecified joint, not elsewhere classified: Secondary | ICD-10-CM | POA: Diagnosis present

## 2024-01-11 DIAGNOSIS — M79605 Pain in left leg: Secondary | ICD-10-CM | POA: Insufficient documentation

## 2024-01-11 DIAGNOSIS — M5416 Radiculopathy, lumbar region: Secondary | ICD-10-CM | POA: Insufficient documentation

## 2024-01-11 NOTE — Progress Notes (Cosign Needed)
 Chief Complaint  Patient presents with   Hypertension    RN f/u: Needs BP check     CRNA not available. I, Md in training, in the capacity of CRNA, checked pt BP after Lyrica  was started.  Pt reports no issues and is in a hurry to leave.   Vitals:   01/10/24 1511  BP: 130/60    Discussed with Dr. Adella after pt had left, as is protocol.

## 2024-01-11 NOTE — Therapy (Signed)
 " OUTPATIENT PHYSICAL THERAPY LOWER EXTREMITY EVALUATION   Patient Name: Kristine Atkinson MRN: [id number] DOB:[birth year], 68 y.o., female Today's Date: 01/11/2024  END OF SESSION:  PT End of Session - 01/11/24 1517     Visit Number 1    Number of Visits 16    Date for Recertification  03/12/24    Authorization Type [employer] Medicare HMO/PPO    PT Start Time 1400    PT Stop Time 1440    PT Time Calculation (min) 40 min    Activity Tolerance Patient tolerated treatment well          Past Medical History:  Diagnosis Date   Cellulitis 06/06/2013   Chest pain 06/07/2015   Chronic pain    Cocaine  abuse (HCC) 09/09/2012   Cocaine  positive on UDS 09/09/2012.     Diabetes mellitus    Elevated serum creatinine 08/14/2013   Fibromyalgia    Greater trochanteric bursitis of left hip 05/29/2014   Hyperlipidemia    Hypertension    Pneumonia due to COVID-19 virus 02/11/2019   Hospitalized with hypoxia, but did not require intubation/ventilation.   Schizophrenia (HCC)    Syncope 08/14/2013   Past Surgical History:  Procedure Laterality Date   OOPHORECTOMY     ectopic   Patient Active Problem List   Diagnosis Date Noted   Left leg pain 12/23/2023   Vitamin D  deficiency 06/17/2023   Onychomycosis 12/17/2022   Diabetic peripheral neuropathy associated with type 2 diabetes mellitus (HCC) 06/20/2022   Tardive dyskinesia 09/24/2020   Schizophrenia, paranoid (HCC) 06/27/2019   Pneumonia due to COVID-19 virus 02/11/2019   AKI (acute kidney injury) 02/11/2019   COVID-19 02/11/2019   Pneumonia due to 2019 novel coronavirus 02/11/2019   Bilateral thigh pain 12/27/2015   Rash 09/05/2015   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 05/07/2015   Right shoulder pain 03/22/2015   Right wrist pain 03/22/2015   Left shoulder pain 02/14/2015   Anxiety and depression 02/14/2015   Peripheral autonomic neuropathy due to diabetes mellitus (HCC) 11/23/2014   Greater trochanteric  bursitis of left hip 05/29/2014   Restrictive lung disease 10/10/2013   Diastolic dysfunction 08/17/2013   Obesity (BMI 30-39.9) 05/29/2013   Thickened endometrium 09/19/2012   Cocaine  abuse (HCC) 09/09/2012   Hyperlipidemia with target LDL less than 100 07/15/2011   Diabetes type 2, controlled (HCC) 06/01/2011   Primary hypertension 06/01/2011   GERD (gastroesophageal reflux disease) 06/01/2011   Former smoker 06/01/2011   Fibromyalgia 05/13/2011    PCP: Adella Norris, MD Ref Provider (PCP)   REFERRING PROVIDER: Adella Norris, MD Ref Provider (PCP)   REFERRING DIAG: M79.605 (ICD-10-CM) - Left leg pain   THERAPY DIAG:  Radiculopathy, lumbar region  Muscle weakness (generalized)  Limited joint range of motion (ROM)  Rationale for Evaluation and Treatment: rehabilitation  ONSET DATE: 1 year  SUBJECTIVE:   SUBJECTIVE STATEMENT: Pain started insidiously. Went to urgent care and was told it was arthritis.   PERTINENT HISTORY: N/a PAIN:  Are you having pain? Yes: NPRS scale: 10 Pain location: L hip  Pain description: sharp Aggravating factors: laying down, standing up for long periods of time Relieving factors: n/a  PRECAUTIONS: None  RED FLAGS: None   WEIGHT BEARING RESTRICTIONS: No  FALLS:  Has patient fallen in last 6 months? No  OCCUPATION: not working  PLOF: Independent  PATIENT GOALS: decrease pain, improve adl completion       OBJECTIVE:  Note: Objective measures were completed at Evaluation unless  otherwise noted.   PATIENT SURVEYS:  LEFS  Extreme difficulty/unable (0), Quite a bit of difficulty (1), Moderate difficulty (2), Little difficulty (3), No difficulty (4) Survey date:    Any of your usual work, housework or school activities   2. Usual hobbies, recreational or sporting activities   3. Getting into/out of the bath   4. Walking between rooms   5. Putting on socks/shoes   6. Squatting    7. Lifting an object, like a bag  of groceries from the floor   8. Performing light activities around your home   9. Performing heavy activities around your home   10. Getting into/out of a car   11. Walking 2 blocks   12. Walking 1 mile   13. Going up/down 10 stairs (1 flight)   14. Standing for 1 hour   15.  sitting for 1 hour   16. Running on even ground   17. Running on uneven ground   18. Making sharp turns while running fast   19. Hopping    20. Rolling over in bed   Score total:  11     COGNITION: Overall cognitive status: Impaired: Areas of impairment:  pt slow to understand instructions, directions, and education during session.      SENSATION: WFL   POSTURE: rounded shoulders, forward head, increased lumbar lordosis, and weight shift right  PALPATION: TTP L glute and lateral left spine  LOWER EXTREMITY ROM:  Lumbar AROM 75% limited (Flexion, rotation, extension, sidebend) with pain radiating to LLE    Active ROM Right eval Left eval  Hip flexion    Hip extension    Hip abduction    Hip adduction    Hip internal rotation    Hip external rotation    Knee flexion    Knee extension    Ankle dorsiflexion    Ankle plantarflexion    Ankle inversion    Ankle eversion     (Blank rows = not tested)  LOWER EXTREMITY MMT: 4- BL LE (hips)  4 Knee flexion and extension BL  MMT Right eval Left eval  Hip flexion    Hip extension    Hip abduction    Hip adduction    Hip internal rotation    Hip external rotation    Knee flexion    Knee extension    Ankle dorsiflexion    Ankle plantarflexion    Ankle inversion    Ankle eversion     (Blank rows = not tested)  LOWER EXTREMITY SPECIAL TESTS:  Pt sx too irritable to test     GAIT: Distance walked: 30 Assistive device utilized: None Level of assistance: Complete Independence Comments: waddling gait with forward bend with trunk shift to R  TREATMENT DATE:   TREATMENT 01/11/2024:    Neuromuscular re-ed: LTR x8x3s Seated ppt x8x3s Seated leg flosser x8x3s Seated RTB clamshell x8x3s    Self-care/Home Management: Patient educated on HEP, POC, prognosis, and relevant tissues/anatomy.     PATIENT EDUCATION:  Education details: HEP Person educated: Patient Education method: Solicitor, Actor cues, Verbal cues, and Handouts Education comprehension: verbalized understanding, returned demonstration, and needs further education  HOME EXERCISE PROGRAM: 5x/wk, 2x/day LTR x8x3s Seated ppt x8x3s Seated leg flosser x8x3s Seated RTB clamshell x8x3s  ASSESSMENT:  CLINICAL IMPRESSION: EVAL: Patient is a 68 year old female who presents with insidious onset lumbar radiculopathy (p! L glute to foot) with insidious onset due to suspected detraining and lack of glute and core strength. Patient presents with deficits in: excessive pain, BL LE strength, lumbar rom, and functional activity tolerance. As a result, the patient would benefit from skilled PT to address aforementioned deficits via plan below.   OBJECTIVE IMPAIRMENTS: decreased cognition, decreased mobility, difficulty walking, decreased ROM, decreased strength, improper body mechanics, postural dysfunction, obesity, and pain.   ACTIVITY LIMITATIONS: carrying, lifting, bending, sitting, standing, squatting, sleeping, stairs, and bed mobility  PERSONAL FACTORS: Age, Behavior pattern, Education, Fitness, Past/current experiences, Social background, Time since onset of injury/illness/exacerbation, and 3+ comorbidities:   are also affecting patient's functional outcome.   REHAB POTENTIAL: Fair    CLINICAL DECISION MAKING: Evolving/moderate complexity  EVALUATION COMPLEXITY: High   GOALS: Goals reviewed with patient? No  SHORT TERM GOALS: Target date: 02/01/2024   1) Patient will demonstrate 75% HEP compliance to  show independence with self-management of condition   Baseline: 0% Goal status: INITIAL  2) Patient will decrease worst pain to 8 at most to improve ADL completion and overall QOL   Baseline: 10 Goal status: INITIAL    LONG TERM GOALS: Target date: 03/12/24   1) Patient will demonstrate 100% HEP compliance to show independence with self-management of condition   Baseline: 0% Goal status: INITIAL  2) Patient will decrease worst pain to 6 at most to improve ADL completion and overall QOL   Baseline:  Goal status: INITIAL  3) Patient will demonstrate a 10 point improvement in LEFS/ODI to show improvements in ADL completion and overall QOL    Baseline: 11 LEFS Goal status: INITIAL  4) Patient will be able to perform house ADLs at least 75% original capacity to demonstrate improvements in pain, functional activity tolerance/strength, and overall QOL    Baseline: unable Goal status: INITIAL      PLAN:  PT FREQUENCY: 1-2x/week  PT DURATION: 8 weeks  PLANNED INTERVENTIONS: 97110-Therapeutic exercises, 97530- Therapeutic activity, 97112- Neuromuscular re-education, 97535- Self Care, [id number]- Manual therapy, [phone]- Gait training, Patient/Family education, Balance training, and Stair training  PLAN FOR NEXT SESSION: HEP assessment and progression, symptom modulation, and loading (isolated and/or functional). Manual therapy, aerobic, gait, and NME training as needed. Sx modulation of L spine via tolerated repeated motions alongside glute and core strengthening.     Washington Greener Luisangel Wainright  PT, DPT  01/11/2024, 7:52 PM  "

## 2024-01-17 ENCOUNTER — Ambulatory Visit: Attending: Internal Medicine

## 2024-01-17 DIAGNOSIS — M5416 Radiculopathy, lumbar region: Secondary | ICD-10-CM | POA: Diagnosis present

## 2024-01-17 DIAGNOSIS — M6281 Muscle weakness (generalized): Secondary | ICD-10-CM | POA: Diagnosis present

## 2024-01-17 DIAGNOSIS — M2560 Stiffness of unspecified joint, not elsewhere classified: Secondary | ICD-10-CM | POA: Insufficient documentation

## 2024-01-17 NOTE — Therapy (Signed)
 " OUTPATIENT PHYSICAL THERAPY TREATMENT   Patient Name: Kristine Atkinson MRN: [id number] DOB:[birth year], 69 y.o., female Today's Date: 01/17/2024  END OF SESSION:  PT End of Session - 01/17/24 1540     Visit Number 2    Number of Visits 16    Date for Recertification  03/12/24    Authorization Type [employer] HMO/PPO    PT Start Time 1540    PT Stop Time 1610    PT Time Calculation (min) 30 min    Activity Tolerance Patient tolerated treatment well           Past Medical History:  Diagnosis Date   Cellulitis 06/06/2013   Chest pain 06/07/2015   Chronic pain    Cocaine  abuse (HCC) 09/09/2012   Cocaine  positive on UDS 09/09/2012.     Diabetes mellitus    Elevated serum creatinine 08/14/2013   Fibromyalgia    Greater trochanteric bursitis of left hip 05/29/2014   Hyperlipidemia    Hypertension    Pneumonia due to COVID-19 virus 02/11/2019   Hospitalized with hypoxia, but did not require intubation/ventilation.   Schizophrenia (HCC)    Syncope 08/14/2013   Past Surgical History:  Procedure Laterality Date   OOPHORECTOMY     ectopic   Patient Active Problem List   Diagnosis Date Noted   Left leg pain 12/23/2023   Vitamin D  deficiency 06/17/2023   Onychomycosis 12/17/2022   Diabetic peripheral neuropathy associated with type 2 diabetes mellitus (HCC) 06/20/2022   Tardive dyskinesia 09/24/2020   Schizophrenia, paranoid (HCC) 06/27/2019   Pneumonia due to COVID-19 virus 02/11/2019   AKI (acute kidney injury) 02/11/2019   COVID-19 02/11/2019   Pneumonia due to 2019 novel coronavirus 02/11/2019   Bilateral thigh pain 12/27/2015   Rash 09/05/2015   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 05/07/2015   Right shoulder pain 03/22/2015   Right wrist pain 03/22/2015   Left shoulder pain 02/14/2015   Anxiety and depression 02/14/2015   Peripheral autonomic neuropathy due to diabetes mellitus (HCC) 11/23/2014   Greater trochanteric bursitis of left hip  05/29/2014   Restrictive lung disease 10/10/2013   Diastolic dysfunction 08/17/2013   Obesity (BMI 30-39.9) 05/29/2013   Thickened endometrium 09/19/2012   Cocaine  abuse (HCC) 09/09/2012   Hyperlipidemia with target LDL less than 100 07/15/2011   Diabetes type 2, controlled (HCC) 06/01/2011   Primary hypertension 06/01/2011   GERD (gastroesophageal reflux disease) 06/01/2011   Former smoker 06/01/2011   Fibromyalgia 05/13/2011    PCP: Adella Norris, MD Ref Provider (PCP)   REFERRING PROVIDER: Adella Norris, MD Ref Provider (PCP)   REFERRING DIAG: M79.605 (ICD-10-CM) - Left leg pain   THERAPY DIAG:  Radiculopathy, lumbar region  Muscle weakness (generalized)  Limited joint range of motion (ROM)  Rationale for Evaluation and Treatment: rehabilitation  ONSET DATE: 1 year  SUBJECTIVE:   SUBJECTIVE STATEMENT: Pt presents to PT with reports of continued severe L hip pain. States she feels like therapy is not helping. Feels HEP is not helping. Hard to get an clear objective due to cognitive impairments.   PERTINENT HISTORY: N/A  PAIN:  Are you having pain?  Yes: NPRS scale: 7/10 Pain location: L hip  Pain description: sharp Aggravating factors: laying down, standing up for long periods of time Relieving factors: n/a  PRECAUTIONS: None  RED FLAGS: None   WEIGHT BEARING RESTRICTIONS: No  FALLS:  Has patient fallen in last 6 months? No  OCCUPATION: not working  PLOF: Independent  PATIENT GOALS: decrease pain, improve adl completion       OBJECTIVE:  Note: Objective measures were completed at Evaluation unless otherwise noted.   PATIENT SURVEYS:  LEFS  Extreme difficulty/unable (0), Quite a bit of difficulty (1), Moderate difficulty (2), Little difficulty (3), No difficulty (4) Survey date:    Any of your usual work, housework or school activities   2. Usual hobbies, recreational or sporting activities   3. Getting into/out of the bath   4.  Walking between rooms   5. Putting on socks/shoes   6. Squatting    7. Lifting an object, like a bag of groceries from the floor   8. Performing light activities around your home   9. Performing heavy activities around your home   10. Getting into/out of a car   11. Walking 2 blocks   12. Walking 1 mile   13. Going up/down 10 stairs (1 flight)   14. Standing for 1 hour   15.  sitting for 1 hour   16. Running on even ground   17. Running on uneven ground   18. Making sharp turns while running fast   19. Hopping    20. Rolling over in bed   Score total:  11     COGNITION: Overall cognitive status: Impaired: Areas of impairment:  pt slow to understand instructions, directions, and education during session.      SENSATION: WFL   POSTURE: rounded shoulders, forward head, increased lumbar lordosis, and weight shift right  PALPATION: TTP L glute and lateral left spine  LOWER EXTREMITY ROM:  Lumbar AROM 75% limited (Flexion, rotation, extension, sidebend) with pain radiating to LLE    Active ROM Right eval Left eval  Hip flexion    Hip extension    Hip abduction    Hip adduction    Hip internal rotation    Hip external rotation    Knee flexion    Knee extension    Ankle dorsiflexion    Ankle plantarflexion    Ankle inversion    Ankle eversion     (Blank rows = not tested)  LOWER EXTREMITY MMT: 4- BL LE (hips)  4 Knee flexion and extension BL  MMT Right eval Left eval  Hip flexion    Hip extension    Hip abduction    Hip adduction    Hip internal rotation    Hip external rotation    Knee flexion    Knee extension    Ankle dorsiflexion    Ankle plantarflexion    Ankle inversion    Ankle eversion     (Blank rows = not tested)  LOWER EXTREMITY SPECIAL TESTS:  Pt sx too irritable to test     GAIT: Distance walked: 30 Assistive device utilized: None Level of assistance: Complete Independence Comments: waddling gait with forward bend with trunk  shift to R  TREATMENT DATE:   TREATMENT 01/17/2024: Therapeutic Exercise: Hooklying ball squeeze 2x10 - 3 hold LTR 2x10 Hooklying clamshell 2x15 GTB Bridge - unable  STS 3x5 LAQ 2x10 3#  TREATMENT 01/11/2024: Neuromuscular re-ed: LTR x8x3s Seated ppt x8x3s Seated leg flosser x8x3s Seated RTB clamshell x8x3s Self-care/Home Management: Patient educated on HEP, POC, prognosis, and relevant tissues/anatomy.     PATIENT EDUCATION:  Education details: HEP Person educated: Patient Education method: Solicitor, Actor cues, Verbal cues, and Handouts Education comprehension: verbalized understanding, returned demonstration, and needs further education  HOME EXERCISE PROGRAM: 5x/wk, 2x/day LTR x8x3s Seated ppt x8x3s Seated leg flosser x8x3s Seated RTB clamshell x8x3s  ASSESSMENT:  CLINICAL IMPRESSION: Pt tolerated treatment fair but was limited by severe pain. We worked on strengthening in [employer] and seating positioning to tolerance. Difficult to get an assessment of change in pain post session. Pt wanted to end session early. Will continue to assess therapeutic benefit and strengthen as tolerated.    EVAL: Patient is a 69 year old female who presents with insidious onset lumbar radiculopathy (p! L glute to foot) with insidious onset due to suspected detraining and lack of glute and core strength. Patient presents with deficits in: excessive pain, BL LE strength, lumbar rom, and functional activity tolerance. As a result, the patient would benefit from skilled PT to address aforementioned deficits via plan below.   OBJECTIVE IMPAIRMENTS: decreased cognition, decreased mobility, difficulty walking, decreased ROM, decreased strength, improper body mechanics, postural dysfunction, obesity, and pain.   ACTIVITY LIMITATIONS: carrying,  lifting, bending, sitting, standing, squatting, sleeping, stairs, and bed mobility  PERSONAL FACTORS: Age, Behavior pattern, Education, Fitness, Past/current experiences, Social background, Time since onset of injury/illness/exacerbation, and 3+ comorbidities:   are also affecting patient's functional outcome.   REHAB POTENTIAL: Fair    CLINICAL DECISION MAKING: Evolving/moderate complexity  EVALUATION COMPLEXITY: High   GOALS: Goals reviewed with patient? No  SHORT TERM GOALS: Target date: 02/01/2024   1) Patient will demonstrate 75% HEP compliance to show independence with self-management of condition   Baseline: 0% Goal status: INITIAL  2) Patient will decrease worst pain to 8 at most to improve ADL completion and overall QOL   Baseline: 10 Goal status: INITIAL    LONG TERM GOALS: Target date: 03/12/24   1) Patient will demonstrate 100% HEP compliance to show independence with self-management of condition   Baseline: 0% Goal status: INITIAL  2) Patient will decrease worst pain to 6 at most to improve ADL completion and overall QOL   Baseline:  Goal status: INITIAL  3) Patient will demonstrate a 10 point improvement in LEFS/ODI to show improvements in ADL completion and overall QOL    Baseline: 11 LEFS Goal status: INITIAL  4) Patient will be able to perform house ADLs at least 75% original capacity to demonstrate improvements in pain, functional activity tolerance/strength, and overall QOL    Baseline: unable Goal status: INITIAL      PLAN:  PT FREQUENCY: 1-2x/week  PT DURATION: 8 weeks  PLANNED INTERVENTIONS: 97110-Therapeutic exercises, 97530- Therapeutic activity, 97112- Neuromuscular re-education, 97535- Self Care, [id number]- Manual therapy, [phone]- Gait training, Patient/Family education, Balance training, and Stair training  PLAN FOR NEXT SESSION: HEP assessment and progression, symptom modulation, and loading (isolated and/or functional).  Manual therapy, aerobic, gait, and NME training as needed. Sx modulation of L spine via tolerated repeated motions alongside glute and core strengthening.    Alm JAYSON Kingdom PT  01/17/2024 4:25 PM   "

## 2024-01-24 ENCOUNTER — Ambulatory Visit

## 2024-01-24 DIAGNOSIS — M5416 Radiculopathy, lumbar region: Secondary | ICD-10-CM

## 2024-01-24 DIAGNOSIS — M6281 Muscle weakness (generalized): Secondary | ICD-10-CM

## 2024-01-24 DIAGNOSIS — M2560 Stiffness of unspecified joint, not elsewhere classified: Secondary | ICD-10-CM

## 2024-01-24 NOTE — Therapy (Signed)
 " OUTPATIENT PHYSICAL THERAPY TREATMENT   Patient Name: Kristine Atkinson MRN: [id number] DOB:1955-09-03, 69 y.o., female Today's Date: 01/24/2024  END OF SESSION:  PT End of Session - 01/24/24 1312     Visit Number 3    Number of Visits 16    Date for Recertification  03/12/24    Authorization Type [employer] HMO/PPO    PT Start Time 1315    PT Stop Time 1345    PT Time Calculation (min) 30 min    Activity Tolerance Patient tolerated treatment well            Past Medical History:  Diagnosis Date   Cellulitis 06/06/2013   Chest pain 06/07/2015   Chronic pain    Cocaine  abuse (HCC) 09/09/2012   Cocaine  positive on UDS 09/09/2012.     Diabetes mellitus    Elevated serum creatinine 08/14/2013   Fibromyalgia    Greater trochanteric bursitis of left hip 05/29/2014   Hyperlipidemia    Hypertension    Pneumonia due to COVID-19 virus 02/11/2019   Hospitalized with hypoxia, but did not require intubation/ventilation.   Schizophrenia (HCC)    Syncope 08/14/2013   Past Surgical History:  Procedure Laterality Date   OOPHORECTOMY     ectopic   Patient Active Problem List   Diagnosis Date Noted   Left leg pain 12/23/2023   Vitamin D  deficiency 06/17/2023   Onychomycosis 12/17/2022   Diabetic peripheral neuropathy associated with type 2 diabetes mellitus (HCC) 06/20/2022   Tardive dyskinesia 09/24/2020   Schizophrenia, paranoid (HCC) 06/27/2019   Pneumonia due to COVID-19 virus 02/11/2019   AKI (acute kidney injury) 02/11/2019   COVID-19 02/11/2019   Pneumonia due to 2019 novel coronavirus 02/11/2019   Bilateral thigh pain 12/27/2015   Rash 09/05/2015   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 05/07/2015   Right shoulder pain 03/22/2015   Right wrist pain 03/22/2015   Left shoulder pain 02/14/2015   Anxiety and depression 02/14/2015   Peripheral autonomic neuropathy due to diabetes mellitus (HCC) 11/23/2014   Greater trochanteric bursitis of left  hip 05/29/2014   Restrictive lung disease 10/10/2013   Diastolic dysfunction 08/17/2013   Obesity (BMI 30-39.9) 05/29/2013   Thickened endometrium 09/19/2012   Cocaine  abuse (HCC) 09/09/2012   Hyperlipidemia with target LDL less than 100 07/15/2011   Diabetes type 2, controlled (HCC) 06/01/2011   Primary hypertension 06/01/2011   GERD (gastroesophageal reflux disease) 06/01/2011   Former smoker 06/01/2011   Fibromyalgia 05/13/2011    PCP: Adella Norris, MD Ref Provider (PCP)   REFERRING PROVIDER: Adella Norris, MD Ref Provider (PCP)   REFERRING DIAG: M79.605 (ICD-10-CM) - Left leg pain   THERAPY DIAG:  Radiculopathy, lumbar region  Muscle weakness (generalized)  Limited joint range of motion (ROM)  Rationale for Evaluation and Treatment: rehabilitation  ONSET DATE: 1 year  SUBJECTIVE:   SUBJECTIVE STATEMENT: Pt presents to PT with reports of continued severe L hip pain. I don't know how long I want to stay today.   PERTINENT HISTORY: N/A  PAIN:  Are you having pain?  Yes: NPRS scale: 7/10 Pain location: L hip  Pain description: sharp Aggravating factors: laying down, standing up for long periods of time Relieving factors: n/a  PRECAUTIONS: None  RED FLAGS: None   WEIGHT BEARING RESTRICTIONS: No  FALLS:  Has patient fallen in last 6 months? No  OCCUPATION: not working  PLOF: Independent  PATIENT GOALS: decrease pain, improve adl completion  OBJECTIVE:  Note: Objective measures were completed at Evaluation unless otherwise noted.   PATIENT SURVEYS:  LEFS  Extreme difficulty/unable (0), Quite a bit of difficulty (1), Moderate difficulty (2), Little difficulty (3), No difficulty (4) Survey date:    Any of your usual work, housework or school activities   2. Usual hobbies, recreational or sporting activities   3. Getting into/out of the bath   4. Walking between rooms   5. Putting on socks/shoes   6. Squatting    7. Lifting  an object, like a bag of groceries from the floor   8. Performing light activities around your home   9. Performing heavy activities around your home   10. Getting into/out of a car   11. Walking 2 blocks   12. Walking 1 mile   13. Going up/down 10 stairs (1 flight)   14. Standing for 1 hour   15.  sitting for 1 hour   16. Running on even ground   17. Running on uneven ground   18. Making sharp turns while running fast   19. Hopping    20. Rolling over in bed   Score total:  11     COGNITION: Overall cognitive status: Impaired: Areas of impairment:  pt slow to understand instructions, directions, and education during session.      SENSATION: WFL   POSTURE: rounded shoulders, forward head, increased lumbar lordosis, and weight shift right  PALPATION: TTP L glute and lateral left spine  LOWER EXTREMITY ROM:  Lumbar AROM 75% limited (Flexion, rotation, extension, sidebend) with pain radiating to LLE    Active ROM Right eval Left eval  Hip flexion    Hip extension    Hip abduction    Hip adduction    Hip internal rotation    Hip external rotation    Knee flexion    Knee extension    Ankle dorsiflexion    Ankle plantarflexion    Ankle inversion    Ankle eversion     (Blank rows = not tested)  LOWER EXTREMITY MMT: 4- BL LE (hips)  4 Knee flexion and extension BL  MMT Right eval Left eval  Hip flexion    Hip extension    Hip abduction    Hip adduction    Hip internal rotation    Hip external rotation    Knee flexion    Knee extension    Ankle dorsiflexion    Ankle plantarflexion    Ankle inversion    Ankle eversion     (Blank rows = not tested)  LOWER EXTREMITY SPECIAL TESTS:  Pt sx too irritable to test     GAIT: Distance walked: 30 Assistive device utilized: None Level of assistance: Complete Independence Comments: waddling gait with forward bend with trunk shift to R  TREATMENT DATE:   TREATMENT 01/24/2024: LTR 2x10 Hooklying ball squeeze 2x10 - 3 hold Bridge 2x10 Hooklying clamshell 3x10 GTB Hooklying march 2x20 GTB Supine SLR 3x5 STS 3x5 LAQ 3x10 3#  TREATMENT 01/17/2024: Therapeutic Exercise: Hooklying ball squeeze 2x10 - 3 hold LTR 2x10 Hooklying clamshell 2x15 GTB Bridge - unable  STS 3x5 LAQ 2x10 3#  TREATMENT 01/11/2024: Neuromuscular re-ed: LTR x8x3s Seated ppt x8x3s Seated leg flosser x8x3s Seated RTB clamshell x8x3s Self-care/Home Management: Patient educated on HEP, POC, prognosis, and relevant tissues/anatomy.     PATIENT EDUCATION:  Education details: HEP Person educated: Patient Education method: Solicitor, Actor cues, Verbal cues, and Handouts Education comprehension: verbalized understanding, returned demonstration, and needs further education  HOME EXERCISE PROGRAM: 5x/wk, 2x/day LTR x8x3s Seated ppt x8x3s Seated leg flosser x8x3s Seated RTB clamshell x8x3s  ASSESSMENT:  CLINICAL IMPRESSION: Pt tolerated treatment better today but continued to be limited by pain. We worked on strengthening in [employer] and seating positioning to tolerance. She reported slight decrease in pain post session but still wanted to end session early. Pt wanted to end session early. Will continue to assess therapeutic benefit and strengthen as tolerated.    EVAL: Patient is a 69 year old female who presents with insidious onset lumbar radiculopathy (p! L glute to foot) with insidious onset due to suspected detraining and lack of glute and core strength. Patient presents with deficits in: excessive pain, BL LE strength, lumbar rom, and functional activity tolerance. As a result, the patient would benefit from skilled PT to address aforementioned deficits via plan below.   OBJECTIVE IMPAIRMENTS: decreased cognition, decreased mobility, difficulty walking, decreased  ROM, decreased strength, improper body mechanics, postural dysfunction, obesity, and pain.   ACTIVITY LIMITATIONS: carrying, lifting, bending, sitting, standing, squatting, sleeping, stairs, and bed mobility  PERSONAL FACTORS: Age, Behavior pattern, Education, Fitness, Past/current experiences, Social background, Time since onset of injury/illness/exacerbation, and 3+ comorbidities:   are also affecting patient's functional outcome.   REHAB POTENTIAL: Fair    CLINICAL DECISION MAKING: Evolving/moderate complexity  EVALUATION COMPLEXITY: High   GOALS: Goals reviewed with patient? No  SHORT TERM GOALS: Target date: 02/01/2024   1) Patient will demonstrate 75% HEP compliance to show independence with self-management of condition   Baseline: 0% Goal status: INITIAL  2) Patient will decrease worst pain to 8 at most to improve ADL completion and overall QOL   Baseline: 10 Goal status: INITIAL    LONG TERM GOALS: Target date: 03/12/24   1) Patient will demonstrate 100% HEP compliance to show independence with self-management of condition   Baseline: 0% Goal status: INITIAL  2) Patient will decrease worst pain to 6 at most to improve ADL completion and overall QOL   Baseline:  Goal status: INITIAL  3) Patient will demonstrate a 10 point improvement in LEFS/ODI to show improvements in ADL completion and overall QOL    Baseline: 11 LEFS Goal status: INITIAL  4) Patient will be able to perform house ADLs at least 75% original capacity to demonstrate improvements in pain, functional activity tolerance/strength, and overall QOL    Baseline: unable Goal status: INITIAL      PLAN:  PT FREQUENCY: 1-2x/week  PT DURATION: 8 weeks  PLANNED INTERVENTIONS: 97110-Therapeutic exercises, 97530- Therapeutic activity, 97112- Neuromuscular re-education, 97535- Self Care, [id number]- Manual therapy, [phone]- Gait training, Patient/Family education, Balance training, and Stair  training  PLAN FOR NEXT SESSION: HEP assessment and progression, symptom modulation, and loading (isolated and/or functional). Manual therapy, aerobic, gait, and  NME training as needed. Sx modulation of L spine via tolerated repeated motions alongside glute and core strengthening.    Alm JAYSON Kingdom PT  01/24/2024 1:55 PM   "

## 2024-01-25 ENCOUNTER — Encounter (HOSPITAL_COMMUNITY): Admitting: Psychiatry

## 2024-01-26 ENCOUNTER — Ambulatory Visit

## 2024-01-26 DIAGNOSIS — M5416 Radiculopathy, lumbar region: Secondary | ICD-10-CM | POA: Diagnosis not present

## 2024-01-26 DIAGNOSIS — M6281 Muscle weakness (generalized): Secondary | ICD-10-CM

## 2024-01-26 DIAGNOSIS — M2560 Stiffness of unspecified joint, not elsewhere classified: Secondary | ICD-10-CM

## 2024-01-26 NOTE — Therapy (Signed)
 " OUTPATIENT PHYSICAL THERAPY TREATMENT/DC   Patient Name: Kristine Atkinson MRN: [id number] DOB:1955-01-18, 69 y.o., female Today's Date: 01/26/2024  END OF SESSION:  PT End of Session - 01/26/24 1429     Visit Number 4    Number of Visits 16    Date for Recertification  03/12/24    Authorization Type [employer] HMO/PPO    PT Start Time 0218    PT Stop Time 0256    PT Time Calculation (min) 38 min    Activity Tolerance Patient tolerated treatment well             Past Medical History:  Diagnosis Date   Cellulitis 06/06/2013   Chest pain 06/07/2015   Chronic pain    Cocaine  abuse (HCC) 09/09/2012   Cocaine  positive on UDS 09/09/2012.     Diabetes mellitus    Elevated serum creatinine 08/14/2013   Fibromyalgia    Greater trochanteric bursitis of left hip 05/29/2014   Hyperlipidemia    Hypertension    Pneumonia due to COVID-19 virus 02/11/2019   Hospitalized with hypoxia, but did not require intubation/ventilation.   Schizophrenia (HCC)    Syncope 08/14/2013   Past Surgical History:  Procedure Laterality Date   OOPHORECTOMY     ectopic   Patient Active Problem List   Diagnosis Date Noted   Left leg pain 12/23/2023   Vitamin D  deficiency 06/17/2023   Onychomycosis 12/17/2022   Diabetic peripheral neuropathy associated with type 2 diabetes mellitus (HCC) 06/20/2022   Tardive dyskinesia 09/24/2020   Schizophrenia, paranoid (HCC) 06/27/2019   Pneumonia due to COVID-19 virus 02/11/2019   AKI (acute kidney injury) 02/11/2019   COVID-19 02/11/2019   Pneumonia due to 2019 novel coronavirus 02/11/2019   Bilateral thigh pain 12/27/2015   Rash 09/05/2015   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 05/07/2015   Right shoulder pain 03/22/2015   Right wrist pain 03/22/2015   Left shoulder pain 02/14/2015   Anxiety and depression 02/14/2015   Peripheral autonomic neuropathy due to diabetes mellitus (HCC) 11/23/2014   Greater trochanteric bursitis of  left hip 05/29/2014   Restrictive lung disease 10/10/2013   Diastolic dysfunction 08/17/2013   Obesity (BMI 30-39.9) 05/29/2013   Thickened endometrium 09/19/2012   Cocaine  abuse (HCC) 09/09/2012   Hyperlipidemia with target LDL less than 100 07/15/2011   Diabetes type 2, controlled (HCC) 06/01/2011   Primary hypertension 06/01/2011   GERD (gastroesophageal reflux disease) 06/01/2011   Former smoker 06/01/2011   Fibromyalgia 05/13/2011    PCP: Adella Norris, MD Ref Provider (PCP)   REFERRING PROVIDER: Adella Norris, MD Ref Provider (PCP)   REFERRING DIAG: M79.605 (ICD-10-CM) - Left leg pain   THERAPY DIAG:  Radiculopathy, lumbar region  Muscle weakness (generalized)  Limited joint range of motion (ROM)  Rationale for Evaluation and Treatment: rehabilitation  ONSET DATE: 1 year  SUBJECTIVE:   SUBJECTIVE STATEMENT: Pt presents to PT with reports that  PERTINENT HISTORY: N/A  PAIN:  Are you having pain?  Yes: NPRS scale: 0/10 Pain location: L hip  Pain description: sharp Aggravating factors: laying down, standing up for long periods of time Relieving factors: n/a  PRECAUTIONS: None  RED FLAGS: None   WEIGHT BEARING RESTRICTIONS: No  FALLS:  Has patient fallen in last 6 months? No  OCCUPATION: not working  PLOF: Independent  PATIENT GOALS: decrease pain, improve adl completion       OBJECTIVE:  Note: Objective measures were completed at Evaluation unless otherwise noted.  PATIENT SURVEYS:  LEFS  Extreme difficulty/unable (0), Quite a bit of difficulty (1), Moderate difficulty (2), Little difficulty (3), No difficulty (4) Survey date:    Any of your usual work, housework or school activities   2. Usual hobbies, recreational or sporting activities   3. Getting into/out of the bath   4. Walking between rooms   5. Putting on socks/shoes   6. Squatting    7. Lifting an object, like a bag of groceries from the floor   8. Performing  light activities around your home   9. Performing heavy activities around your home   10. Getting into/out of a car   11. Walking 2 blocks   12. Walking 1 mile   13. Going up/down 10 stairs (1 flight)   14. Standing for 1 hour   15.  sitting for 1 hour   16. Running on even ground   17. Running on uneven ground   18. Making sharp turns while running fast   19. Hopping    20. Rolling over in bed   Score total:  11     COGNITION: Overall cognitive status: Impaired: Areas of impairment:  pt slow to understand instructions, directions, and education during session.      SENSATION: WFL   POSTURE: rounded shoulders, forward head, increased lumbar lordosis, and weight shift right  PALPATION: TTP L glute and lateral left spine  LOWER EXTREMITY ROM:  Lumbar AROM 75% limited (Flexion, rotation, extension, sidebend) with pain radiating to LLE    Active ROM Right eval Left eval  Hip flexion    Hip extension    Hip abduction    Hip adduction    Hip internal rotation    Hip external rotation    Knee flexion    Knee extension    Ankle dorsiflexion    Ankle plantarflexion    Ankle inversion    Ankle eversion     (Blank rows = not tested)  LOWER EXTREMITY MMT: 4- BL LE (hips)  4 Knee flexion and extension BL  MMT Right eval Left eval  Hip flexion    Hip extension    Hip abduction    Hip adduction    Hip internal rotation    Hip external rotation    Knee flexion    Knee extension    Ankle dorsiflexion    Ankle plantarflexion    Ankle inversion    Ankle eversion     (Blank rows = not tested)  LOWER EXTREMITY SPECIAL TESTS:  Pt sx too irritable to test     GAIT: Distance walked: 30 Assistive device utilized: None Level of assistance: Complete Independence Comments: waddling gait with forward bend with trunk shift to R                                                                                                                         TREATMENT DATE:    [facility] Adult PT Treatment:  DATE: 01/26/24 Therapeutic Exercise: LTR 2x10 Hooklying ball squeeze 2x10 - 3 hold Bridge 2x10 Hooklying clamshell 3x10 GTB Hooklying march 2x20 GTB Supine SLR 3x5 STS 3x5 LAQ 3x10 3#  PATIENT EDUCATION:  Education details: HEP Person educated: Patient Education method: Programmer, Multimedia, Demonstration, Actor cues, Verbal cues, and Handouts Education comprehension: verbalized understanding, returned demonstration, and needs further education  HOME EXERCISE PROGRAM: 5x/wk, 2x/day LTR x8x3s Seated ppt x8x3s Seated leg flosser x8x3s Seated RTB clamshell x8x3s  ASSESSMENT:  CLINICAL IMPRESSION: Pt completed PT for lumbopelvic strengthening. Pt stated she was not in pain today. Pt tolerated prescribed exs without adverse effects. Pt appears to be making improvement re pain. At the of session, pt stated she did not want to come in for additional PT due to time and cost. Pt was Dced from per her request.  EVAL: Patient is a 69 year old female who presents with insidious onset lumbar radiculopathy (p! L glute to foot) with insidious onset due to suspected detraining and lack of glute and core strength. Patient presents with deficits in: excessive pain, BL LE strength, lumbar rom, and functional activity tolerance. As a result, the patient would benefit from skilled PT to address aforementioned deficits via plan below.   OBJECTIVE IMPAIRMENTS: decreased cognition, decreased mobility, difficulty walking, decreased ROM, decreased strength, improper body mechanics, postural dysfunction, obesity, and pain.   ACTIVITY LIMITATIONS: carrying, lifting, bending, sitting, standing, squatting, sleeping, stairs, and bed mobility  PERSONAL FACTORS: Age, Behavior pattern, Education, Fitness, Past/current experiences, Social background, Time since onset of injury/illness/exacerbation, and 3+ comorbidities:   are also affecting  patient's functional outcome.   REHAB POTENTIAL: Fair    CLINICAL DECISION MAKING: Evolving/moderate complexity  EVALUATION COMPLEXITY: High   GOALS: Goals reviewed with patient? No  SHORT TERM GOALS: Target date: 02/01/2024   1) Patient will demonstrate 75% HEP compliance to show independence with self-management of condition   Baseline: 0% Goal status: NA  2) Patient will decrease worst pain to 8 at most to improve ADL completion and overall QOL   Baseline: 10 Goal status: NA    LONG TERM GOALS: Target date: 03/12/24   1) Patient will demonstrate 100% HEP compliance to show independence with self-management of condition   Baseline: 0% Goal status: NA  2) Patient will decrease worst pain to 6 at most to improve ADL completion and overall QOL   Baseline:  Goal status: NA  3) Patient will demonstrate a 10 point improvement in LEFS/ODI to show improvements in ADL completion and overall QOL    Baseline: 11 LEFS Goal status: NA  4) Patient will be able to perform house ADLs at least 75% original capacity to demonstrate improvements in pain, functional activity tolerance/strength, and overall QOL    Baseline: unable Goal status: NA      PLAN:  PT FREQUENCY: 1-2x/week  PT DURATION: 8 weeks  PLANNED INTERVENTIONS: 97110-Therapeutic exercises, 97530- Therapeutic activity, 97112- Neuromuscular re-education, 97535- Self Care, [id number]- Manual therapy, [phone]- Gait training, Patient/Family education, Balance training, and Stair training  PLAN FOR NEXT SESSION: HEP assessment and progression, symptom modulation, and loading (isolated and/or functional). Manual therapy, aerobic, gait, and NME training as needed. Sx modulation of L spine via tolerated repeated motions alongside glute and core strengthening.   PHYSICAL THERAPY DISCHARGE SUMMARY  Visits from Start of Care: 4  Current functional level related to goals / functional outcomes: Pt reported improved  pain   Remaining deficits: Not reassessed with pt requested to stop coming to PT related  to time and cost.   Education / Equipment: HEP   Patient agrees to discharge. Patient goals were not met. Patient is being discharged due to Pt requesting DC after 4 visits.   Dennis Hegeman MS, PT 01/26/2024 3:02 PM     "

## 2024-01-27 ENCOUNTER — Ambulatory Visit (INDEPENDENT_AMBULATORY_CARE_PROVIDER_SITE_OTHER): Admitting: Psychiatry

## 2024-01-27 DIAGNOSIS — F200 Paranoid schizophrenia: Secondary | ICD-10-CM

## 2024-01-27 DIAGNOSIS — F411 Generalized anxiety disorder: Secondary | ICD-10-CM

## 2024-01-27 MED ORDER — HYDROXYZINE HCL 50 MG PO TABS: 50.0000 mg | ORAL_TABLET | Freq: Three times a day (TID) | ORAL | 3 refills | Status: AC | PRN

## 2024-01-27 MED ORDER — TEMAZEPAM 15 MG PO CAPS: 15.0000 mg | ORAL_CAPSULE | Freq: Every evening | ORAL | 1 refills | Status: AC | PRN

## 2024-01-27 MED ORDER — TRAZODONE HCL 300 MG PO TABS: 300.0000 mg | ORAL_TABLET | Freq: Every evening | ORAL | 3 refills | Status: AC | PRN

## 2024-01-27 NOTE — Progress Notes (Signed)
 [facility] MD/PA/NP OP Progress Note  01/27/2024 3:00 PM Kristine Atkinson  MRN:  [id number]  Chief Complaint: Can you increase my medications  HPI: 69 year old female seen today for follow up psychiatric evaluation. She has a psychiatric history of anxiety, depression, paranoid schizophrenia, and unspecified adult personality disorder. She is currently managed on Trazodone  200 mg nightly as needed, and Restoril  15 mg nightly as needed.  She informed clinical research associate that she has been taking an old prescription of hydroxyzine  25 mg.  Patient reports her medications are somewhat effective in managing her psychiatric condition.    Today she is well groomed, pleasant, restless, and disengaged in conversation.  Patient is restless during exam and is in a hurry to leave.  She informed clinical research associate that she would like her medications increased.  Patient notes that she is not sleeping well.  She informed clinical research associate that she would like to restart hydroxyzine  and have trazodone  increased to help manage her sleep.  Patient notes that she never received her prescription of Restoril  and request that provider refill it.  Provider was agreeable to sending a partial dose.  Patient unwilling to do a GAD-7 or PHQ-9 today as she was restless and notes that she had to leave.  She denies psychosis or mania.  Patient also denies SI/HI.  Today provider conducted an Aims assessment and patient scored an 36, at her las visit she scored an 5.     Patient continues to suffer from TD however found Ingrezza , Cogentin , and Austedo  ineffective and does not wish to start other medications to help manage TD.  Today trazodone  200 mg increased to 300 mg to help manage sleep, anxiety, and depression.  Hydroxyzine  25 mg nightly increased to 50 mg 3 times daily as needed to help manage sleep and anxiety.  Restoril  15 mg restarted.  Patient given a quantity of 15. No other concerns no at this time.   Visit Diagnosis:    ICD-10-CM   1. Anxiety state  F41.1  hydrOXYzine  (ATARAX ) 50 MG tablet    traZODone  (DESYREL ) 300 MG tablet    2. Schizophrenia, paranoid (HCC)  F20.0 temazepam  (RESTORIL ) 15 MG capsule            Past Psychiatric History: anxiety, depression, paranoid schizophrenia, and unspecified adult personality disorder  Past Medical History:  Past Medical History:  Diagnosis Date   Cellulitis 06/06/2013   Chest pain 06/07/2015   Chronic pain    Cocaine  abuse (HCC) 09/09/2012   Cocaine  positive on UDS 09/09/2012.     Diabetes mellitus    Elevated serum creatinine 08/14/2013   Fibromyalgia    Greater trochanteric bursitis of left hip 05/29/2014   Hyperlipidemia    Hypertension    Pneumonia due to COVID-19 virus 02/11/2019   Hospitalized with hypoxia, but did not require intubation/ventilation.   Schizophrenia (HCC)    Syncope 08/14/2013    Past Surgical History:  Procedure Laterality Date   OOPHORECTOMY     ectopic    Family Psychiatric History: None reported   Family History:  Family History  Problem Relation Age of Onset   Hypertension Mother    Cancer Father        not clear on history   Cancer Sister        Lung   Heart disease Son 75   Cancer Maternal Aunt        colon cancer   Breast cancer Niece    Colon cancer Neg Hx    Colon  polyps Neg Hx    Rectal cancer Neg Hx    Stomach cancer Neg Hx    Esophageal cancer Neg Hx     Social History:  Social History   Socioeconomic History   Marital status: Married    Spouse name: Kristine Atkinson   Number of children: 2   Years of education: Not on file   Highest education level: 11th grade  Occupational History   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     Social Drivers of Health   Tobacco Use: Medium Risk (01/26/2024)   Patient History    Smoking Tobacco Use: Former    Smokeless Tobacco Use: Never    Passive Exposure: Past  Physicist, Medical Strain: Low Risk (12/17/2022)   Overall Financial Resource Strain (CARDIA)    Difficulty of Paying Living Expenses: Not hard at all  Food Insecurity: Food Insecurity Present (12/30/2023)   Epic    Worried About Programme Researcher, Broadcasting/film/video in the Last Year: Sometimes true    [employer] of Food in the Last Year: Sometimes true  Transportation Needs: Unmet Transportation Needs (12/17/2022)   PRAPARE - Administrator, Civil Service (Medical): Yes    Lack of Transportation (Non-Medical): No  Physical Activity: Not on file  Stress: Not on file  Social Connections: Not on file  Depression (EYV7-0): Medium Risk (12/28/2022)   Depression (PHQ2-9)    PHQ-2 Score: 6  Alcohol Screen: Not on file  Housing: Low Risk (12/17/2022)   Housing    Last Housing Risk Score: 0  Utilities: Not At Risk (12/17/2022)   AHC Utilities    Threatened with loss of utilities: No  Health Literacy: Not on file    Allergies:  Allergies  Allergen Reactions   Advil  [Ibuprofen ] Nausea Only   Penicillins Itching    Did it involve swelling of the face/tongue/throat, SOB, or low BP? Unknown Did it involve sudden or severe rash/hives, skin peeling, or any reaction on the inside of your mouth or nose? Unknown Did you need to seek medical attention at a hospital or doctor's office? Unknown When did it last happen? Unknown If all above answers are NO, may proceed with cephalosporin use.    Metabolic  Disorder Labs: Lab Results  Component Value Date   HGBA1C 5.8 (H) 12/23/2023   No results found for: PROLACTIN Lab Results  Component Value Date   CHOL 167 12/23/2023   TRIG 84 12/23/2023   HDL 60 12/23/2023   CHOLHDL 4.8 08/27/2014   VLDL 26 08/27/2014   LDLCALC 91 12/23/2023   LDLCALC 68 12/17/2022   Lab Results  Component Value Date   TSH 0.657 05/21/2021   TSH 0.667 03/02/2012    Therapeutic Level Labs: No results found  for: LITHIUM No results found for: VALPROATE No results found for: CBMZ  Current Medications: Current Outpatient Medications  Medication Sig Dispense Refill   acetaminophen  (TYLENOL ) 500 MG tablet 2 tabs by mouth twice daily     aspirin  81 MG tablet Take 1 tablet (81 mg total) by mouth daily. 100 tablet 1   Calcium  Citrate 250 MG TABS 2 tabs by mouth twice daily 180 tablet 3   carvedilol  (COREG ) 3.125 MG tablet Take 1 tablet (3.125 mg total) by mouth 2 (two) times daily. 180 tablet 3   Cholecalciferol (VITAMIN D3) 25 MCG (1000 UT) CAPS Take 1 capsule (1,000 Units total) by mouth daily. 30 capsule 11   Dapagliflozin  Pro-metFORMIN  ER (XIGDUO  XR) 5-500 MG TB24 Take 1 tablet by mouth 2 (two) times daily with a meal. 180 tablet 3   diclofenac  Sodium (VOLTAREN  ARTHRITIS PAIN) 1 % GEL Apply 2 g topically 4 (four) times daily. (Patient not taking: Reported on 12/30/2023) 50 g 0   hydrochlorothiazide  (HYDRODIURIL ) 25 MG tablet Take 1 tablet (25 mg total) by mouth daily. 90 tablet 3   hydrOXYzine  (ATARAX ) 50 MG tablet Take 1 tablet (50 mg total) by mouth 3 (three) times daily as needed. 90 tablet 3   losartan  (COZAAR ) 50 MG tablet Take 1 tablet (50 mg total) by mouth daily. 90 tablet 3   meloxicam  (MOBIC ) 15 MG tablet TAKE 1 TABLET EVERY DAY WITH FOOD AS NEEDED FOR LEG PAIN 60 tablet 5   omeprazole  (PRILOSEC) 20 MG capsule Take 1 capsule (20 mg total) by mouth 2 (two) times daily. 120 capsule 5   pregabalin  (LYRICA ) 100 MG capsule TAKE 1 CAPSULE BY MOUTH  TWICE DAILY (Patient not taking: Reported on 12/30/2023) 180 capsule 3   rosuvastatin  (CRESTOR ) 20 MG tablet TAKE 1 TABLET EVERY EVENING WITH SUPPER 90 tablet 3   temazepam  (RESTORIL ) 15 MG capsule Take 1 capsule (15 mg total) by mouth at bedtime as needed for sleep. 15 capsule 1   traZODone  (DESYREL ) 300 MG tablet Take 1 tablet (300 mg total) by mouth at bedtime as needed for sleep. 30 tablet 3   No current facility-administered medications for this visit.     Musculoskeletal: Strength & Muscle Tone: within normal limits Gait & Station: normal Patient leans: N/A  Psychiatric Specialty Exam: Review of Systems  Blood pressure 118/72, pulse 92, temperature (!) 97.4 F (36.3 C), weight 176 lb (79.8 kg), SpO2 100%.Body mass index is 32.72 kg/m.  General Appearance: Well Groomed  Eye Contact:  Good  Speech:  Slurred  Volume:  Normal  Mood:  Euthymic  Affect:  Appropriate and Congruent  Thought Process:  Coherent, Goal Directed, and Linear  Orientation:  Full (Time, Place, and Person)  Thought Content: WDL and Logical   Suicidal Thoughts:  No  Homicidal Thoughts:  No  Memory:  Immediate;   Good Recent;   Good Remote;   Good  Judgement:  Good  Insight:  Good  Psychomotor Activity:  Restlessness  Concentration:  Concentration: Good and Attention Span: Good  Recall:  Good  Fund of Knowledge: Good  Language: Good  Akathisia:  No  Handed:  Right  AIMS (if indicated):  done, 11  Assets:  Communication Skills Desire for Improvement Financial Resources/Insurance Housing Intimacy Leisure Time Physical Health Social Support  ADL's:  Intact  Cognition: WNL  Sleep:  Fair   Screenings: AIMS    Flowsheet Row Clinical Support from 10/21/2023 in [facility] Clinical Support from 06/03/2023  in [facility] Clinical Support from 03/11/2023 in [facility] Clinical Support from 12/28/2022 in [facility] Clinical Support from 09/08/2022 in [facility]  AIMS Total Score 11 9 8 14 14    GAD-7    Flowsheet Row Clinical Support from 12/28/2022 in [facility] Clinical Support from 09/08/2022 in [facility] Office Visit from 06/09/2022 in [facility] Office Visit from 03/12/2022 in [facility] Office Visit from 12/18/2021 in [facility]  Total GAD-7 Score 12 7 6 13  0   PHQ2-9    Flowsheet Row Clinical Support from 12/28/2022 in [facility] Clinical Support from 09/08/2022 in [facility] Office Visit from 06/09/2022 in [facility] Office Visit from 03/12/2022 in [facility] Office Visit from 12/18/2021 in [facility]  PHQ-2 Total Score 0 3 0 4 0  PHQ-9 Total Score 6 14 0 13 --   Flowsheet Row Office Visit from 12/18/2021 in [facility] Office Visit from 09/18/2021 in [facility] Office Visit from 06/19/2021 in [facility]  C-SSRS RISK CATEGORY No Risk No Risk No Risk     Assessment and Plan: Patient continues to suffer from TD however found Ingrezza , Cogentin , and Austedo  ineffective and does not wish to start other medications to help manage TD. She complains of poor sleep. Today trazodone  200 mg increased to 300 mg to help manage sleep, anxiety, and depression.  Hydroxyzine  25 mg nightly increased to 50 mg 3 times daily as needed to help manage sleep and anxiety.  Restoril  15 mg restarted.  Patient given a quantity of 15.   1. Anxiety state  Increased- hydrOXYzine  (ATARAX ) 50 MG tablet; Take 1 tablet (50 mg total) by mouth 3 (three) times daily as needed.  Dispense: 90 tablet;  Refill: 3 Increased- traZODone  (DESYREL ) 300 MG tablet; Take 1 tablet (300 mg total) by mouth at bedtime as needed for sleep.  Dispense: 30 tablet; Refill: 3  2. Schizophrenia, paranoid (HCC)  Continue- temazepam  (RESTORIL ) 15 MG capsule; Take 1 capsule (15 mg total) by mouth at bedtime as needed for sleep.  Dispense: 15 capsule; Refill: 1    Collaboration of Care: Collaboration of Care: Other provider involved in patient's care AEB PCP  Patient/Guardian was advised Release of Information must be obtained prior to any record release in order to collaborate their care with an outside provider. Patient/Guardian was advised if they have not already done so to contact the registration department to sign all necessary forms in order for us  to release information regarding their care.   Consent: Patient/Guardian gives verbal consent for treatment and assignment of benefits for services provided during this visit. Patient/Guardian expressed understanding and agreed to proceed.   Follow up in 3 months   Zane FORBES Bach, NP 01/27/2024, 3:00 PM

## 2024-02-02 ENCOUNTER — Encounter (HOSPITAL_COMMUNITY): Payer: Self-pay

## 2024-02-02 ENCOUNTER — Ambulatory Visit (INDEPENDENT_AMBULATORY_CARE_PROVIDER_SITE_OTHER)

## 2024-02-02 DIAGNOSIS — F32A Depression, unspecified: Secondary | ICD-10-CM

## 2024-02-02 DIAGNOSIS — F419 Anxiety disorder, unspecified: Secondary | ICD-10-CM
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
## 2024-02-10 ENCOUNTER — Ambulatory Visit: Admitting: Physician Assistant

## 2024-02-10 ENCOUNTER — Encounter: Payer: Self-pay | Admitting: Physician Assistant

## 2024-02-10 ENCOUNTER — Other Ambulatory Visit (INDEPENDENT_AMBULATORY_CARE_PROVIDER_SITE_OTHER): Payer: Self-pay

## 2024-02-10 DIAGNOSIS — G8929 Other chronic pain: Secondary | ICD-10-CM | POA: Diagnosis not present

## 2024-02-10 DIAGNOSIS — M5442 Lumbago with sciatica, left side: Secondary | ICD-10-CM

## 2024-02-10 NOTE — Progress Notes (Signed)
 "  Office Visit Note   Patient: Kristine Atkinson           Date of Birth: Feb 02, 1955           MRN: [id number] Visit Date: 02/10/2024              Requested by: Adella Norris, MD [address] [place],  [place] [address] PCP: Adella Norris, MD   Assessment & Plan: Visit Diagnoses:  1. Chronic left-sided low back pain with left-sided sciatica     Plan: Given the fact that the patient has failed conservative treatment which is included medications physical therapy and time and continues to have radicular symptoms down both legs recommend MRI of the lumbar spine to rule out HNP.  Questions were encouraged and answered.  She will follow-up 1 week after the MRI to go over results and discuss further treatment.  Follow-Up Instructions: Return After MRI.   Orders:  Orders Placed This Encounter  Procedures   XR Lumbar Spine 2-3 Views   No orders of the defined types were placed in this encounter.     Procedures: No procedures performed   Clinical Data: No additional findings.   Subjective: Chief Complaint  Patient presents with   Lower Back - Pain    HPI Kristine Atkinson 69 year old female were seen for the first time.  She has leg pain mainly down the left leg but some down the right.  She states the pain radiates down the left leg into her foot.  The right lower back pain radiates to the knee.  She does state that the back pain awakens her.  She denies any bowel or bladder dysfunction saddle anesthesia.  She states she has lost some 75 pounds and then gained the weight back she feels that she lost 75 pounds over the summer and then regained the weight.  She has had ongoing back pain for at least a year.  She is going to physical therapy has had no real relief with the back pain and radicular symptoms she has also tried Lyrica  without any relief.  She is diabetic and her hemoglobin A1c 1 month ago was 5.8. Review of Systems See HPI  Objective: Vital Signs: There were  no vitals taken for this visit.  Physical Exam Constitutional:      Appearance: She is not ill-appearing or diaphoretic.  Cardiovascular:     Pulses: Normal pulses.  Pulmonary:     Effort: Pulmonary effort is normal.  Neurological:     Mental Status: She is alert and oriented to person, place, and time.  Psychiatric:        Mood and Affect: Mood normal.     Ortho Exam Lower extremities 5 out of 5 strength throughout the hip lower extremities except for extension of right great toe against resistance which is 4 out of 5.  Good range of motion bilateral hips without pain.  Straight leg raise is positive on the left.  She is able to walk on her tiptoes and heels.  Tandem gait she loses balance with.  Tight hamstrings bilaterally.   Lumbar spine: Tenderness left lower lumbar paraspinous region.  Limited flexion extension attempts of flexion extension lumbar spine causes pain. Specialty Comments:  No specialty comments available.  Imaging: XR Lumbar Spine 2-3 Views Result Date: 02/10/2024 Lumbar spine 2 views: Bone appears osteopenic.  Diffuse degenerative changes throughout the lumbar spine with anterior vertebral spurring.  Slight spinal listhesis L5 on S1.  No acute fractures.  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 05/07/2015   Right shoulder pain 03/22/2015   Right wrist pain 03/22/2015   Left shoulder pain 02/14/2015   Anxiety  and depression 02/14/2015   Peripheral autonomic neuropathy due to diabetes mellitus (HCC) 11/23/2014   Greater trochanteric bursitis of left hip 05/29/2014   Restrictive lung disease 10/10/2013   Diastolic dysfunction 08/17/2013   Obesity (BMI 30-39.9) 05/29/2013   Thickened endometrium 09/19/2012   Cocaine  abuse (HCC) 09/09/2012   Hyperlipidemia with target LDL less than 100 07/15/2011   Diabetes type 2, controlled (HCC) 06/01/2011   Primary hypertension 06/01/2011   GERD (gastroesophageal reflux disease) 06/01/2011   Former smoker 06/01/2011   Fibromyalgia 05/13/2011   Past Medical History:  Diagnosis Date   Cellulitis 06/06/2013   Chest pain 06/07/2015   Chronic pain    Cocaine  abuse (HCC) 09/09/2012   Cocaine  positive on UDS 09/09/2012.     Diabetes mellitus    Elevated serum creatinine 08/14/2013   Fibromyalgia    Greater trochanteric bursitis of left hip 05/29/2014   Hyperlipidemia    Hypertension    Pneumonia due to COVID-19 virus 02/11/2019   Hospitalized with hypoxia, but did not require intubation/ventilation.   Schizophrenia (HCC)    Syncope 08/14/2013    Family History  Problem Relation Age of Onset   Hypertension Mother    Cancer Father        not clear on history   Cancer Sister        Lung   Heart disease Son 66   Cancer Maternal Aunt        colon cancer   Breast cancer Niece    Colon cancer Neg Hx    Colon polyps Neg Hx    Rectal cancer Neg Hx    Stomach cancer Neg Hx    Esophageal cancer Neg Hx     Past Surgical History:  Procedure Laterality Date   OOPHORECTOMY     ectopic   Social History   Occupational History   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  Tobacco Use   Smoking status: Former    Current packs/day: 0.00    Average packs/day: 0.2 packs/day for 42.4 years (8.5 ttl pk-yrs)    Types: Cigarettes    Start date: 01/12/1973    Quit date: 06/13/2015    Years since quitting: 8.6    Passive exposure: Past    Smokeless tobacco: Never  Vaping Use   Vaping status: Never Used  Substance and Sexual Activity   Alcohol use: No    Alcohol/week: 0.0 standard drinks of alcohol   Drug use: Not Currently   Sexual activity: Not Currently        "

## 2024-02-11 ENCOUNTER — Other Ambulatory Visit: Payer: Self-pay | Admitting: Radiology

## 2024-02-11 DIAGNOSIS — G8929 Other chronic pain: Secondary | ICD-10-CM

## 2024-02-21 ENCOUNTER — Ambulatory Visit (HOSPITAL_COMMUNITY)

## 2024-03-04 ENCOUNTER — Other Ambulatory Visit

## 2024-03-27 ENCOUNTER — Ambulatory Visit (HOSPITAL_BASED_OUTPATIENT_CLINIC_OR_DEPARTMENT_OTHER)

## 2024-04-06 ENCOUNTER — Encounter (HOSPITAL_COMMUNITY): Admitting: Psychiatry

## 2024-06-29 ENCOUNTER — Other Ambulatory Visit

## 2024-07-04 ENCOUNTER — Ambulatory Visit: Admitting: Internal Medicine

## 2024-12-29 ENCOUNTER — Other Ambulatory Visit: Admitting: Internal Medicine

## 2025-01-10 ENCOUNTER — Encounter: Admitting: Internal Medicine
